# Patient Record
Sex: Female | Born: 1945 | Race: Asian | Hispanic: No | Smoking: Former smoker
Health system: Southern US, Community
[De-identification: ages and names within clinical notes are randomized; demographics above are authoritative.]

## PROBLEM LIST (undated history)

## (undated) DIAGNOSIS — M85859 Other specified disorders of bone density and structure, unspecified thigh: Secondary | ICD-10-CM

## (undated) DIAGNOSIS — A809 Acute poliomyelitis, unspecified: Secondary | ICD-10-CM

## (undated) DIAGNOSIS — M810 Age-related osteoporosis without current pathological fracture: Secondary | ICD-10-CM

## (undated) DIAGNOSIS — E119 Type 2 diabetes mellitus without complications: Secondary | ICD-10-CM

## (undated) DIAGNOSIS — E785 Hyperlipidemia, unspecified: Secondary | ICD-10-CM

## (undated) HISTORY — DX: Other specified disorders of bone density and structure, unspecified thigh: M85.859

## (undated) HISTORY — DX: Type 2 diabetes mellitus without complications: E11.9

## (undated) HISTORY — PX: VARICOSE VEIN SURGERY: SHX832

## (undated) HISTORY — DX: Hyperlipidemia, unspecified: E78.5

## (undated) HISTORY — DX: Acute poliomyelitis, unspecified: A80.9

## (undated) HISTORY — DX: Age-related osteoporosis without current pathological fracture: M81.0

---

## 2005-10-07 ENCOUNTER — Ambulatory Visit: Payer: Self-pay | Admitting: Gastroenterology

## 2010-10-09 ENCOUNTER — Ambulatory Visit: Payer: Self-pay | Admitting: Family Medicine

## 2011-10-28 ENCOUNTER — Ambulatory Visit: Payer: Self-pay

## 2012-11-10 ENCOUNTER — Ambulatory Visit: Payer: Self-pay

## 2013-11-22 ENCOUNTER — Ambulatory Visit: Payer: Self-pay | Admitting: Family Medicine

## 2013-12-05 ENCOUNTER — Ambulatory Visit: Payer: Self-pay | Admitting: Family Medicine

## 2014-10-15 DIAGNOSIS — J209 Acute bronchitis, unspecified: Secondary | ICD-10-CM | POA: Diagnosis not present

## 2014-10-15 DIAGNOSIS — R05 Cough: Secondary | ICD-10-CM | POA: Diagnosis not present

## 2014-10-22 DIAGNOSIS — M810 Age-related osteoporosis without current pathological fracture: Secondary | ICD-10-CM | POA: Diagnosis not present

## 2014-10-22 DIAGNOSIS — E119 Type 2 diabetes mellitus without complications: Secondary | ICD-10-CM | POA: Diagnosis not present

## 2014-10-22 DIAGNOSIS — Z7189 Other specified counseling: Secondary | ICD-10-CM | POA: Diagnosis not present

## 2014-10-22 DIAGNOSIS — Z0000 Encounter for general adult medical examination without abnormal findings: Secondary | ICD-10-CM | POA: Diagnosis not present

## 2014-11-01 DIAGNOSIS — E119 Type 2 diabetes mellitus without complications: Secondary | ICD-10-CM | POA: Diagnosis not present

## 2014-11-19 ENCOUNTER — Other Ambulatory Visit: Payer: Self-pay | Admitting: Family Medicine

## 2014-11-19 DIAGNOSIS — M810 Age-related osteoporosis without current pathological fracture: Secondary | ICD-10-CM

## 2014-11-25 ENCOUNTER — Ambulatory Visit
Admit: 2014-11-25 | Disposition: A | Discharge status: Home / Self Care | Payer: Self-pay | Attending: Family Medicine | Admitting: Family Medicine

## 2014-11-25 DIAGNOSIS — Z1231 Encounter for screening mammogram for malignant neoplasm of breast: Secondary | ICD-10-CM | POA: Diagnosis not present

## 2014-12-24 ENCOUNTER — Other Ambulatory Visit: Payer: Self-pay

## 2015-01-03 ENCOUNTER — Telehealth: Payer: Self-pay | Admitting: Family Medicine

## 2015-01-03 NOTE — Telephone Encounter (Signed)
Left message for patient to call back 01/03/15.

## 2015-01-03 NOTE — Telephone Encounter (Signed)
Spoke with patient and she is stopping metformin for the weekend and she will come in for appt on Monday.

## 2015-01-03 NOTE — Telephone Encounter (Signed)
Pt is requesting a return call. She is questioning if she should stop taking metformin because for 3 months it is causing her to itch and if she should make an appointment [phone]

## 2015-01-06 ENCOUNTER — Encounter: Payer: Self-pay | Admitting: Family Medicine

## 2015-01-06 ENCOUNTER — Ambulatory Visit (INDEPENDENT_AMBULATORY_CARE_PROVIDER_SITE_OTHER): Payer: Medicare Other | Admitting: Family Medicine

## 2015-01-06 VITALS — BP 100/58 | HR 63 | Resp 15 | Ht 61.0 in | Wt 133.6 lb

## 2015-01-06 DIAGNOSIS — E119 Type 2 diabetes mellitus without complications: Secondary | ICD-10-CM | POA: Insufficient documentation

## 2015-01-06 DIAGNOSIS — L03113 Cellulitis of right upper limb: Secondary | ICD-10-CM | POA: Insufficient documentation

## 2015-01-06 DIAGNOSIS — M810 Age-related osteoporosis without current pathological fracture: Secondary | ICD-10-CM | POA: Insufficient documentation

## 2015-01-06 MED ORDER — CLINDAMYCIN HCL 300 MG PO CAPS: 300.0000 mg | ORAL_CAPSULE | Freq: Four times a day (QID) | ORAL | Status: DC

## 2015-01-06 NOTE — Progress Notes (Deleted)
Name: Kiara Brady   MRN: [id number]    DOB: 08/23/1945   Date:01/06/2015       Progress Note  Subjective  Chief Complaint  Chief Complaint  Patient presents with  . Rash    Poision ivy     Rash This is a recurrent problem. The current episode started in the past 7 days. The problem has been gradually improving (started as a swelling on her right wrist) since onset. The affected locations include the right arm. The rash is characterized by swelling and blistering. She was exposed to plant contact. Associated symptoms include fatigue and shortness of breath. Pertinent negatives include no anorexia, cough, fever, nail changes, sore throat or vomiting. Past treatments include topical steroids. The treatment provided moderate relief.      Past Medical History  Diagnosis Date  . Diabetes mellitus without complication    Past Surgical History  Procedure Laterality Date  . Varicose vein surgery     Family History  Problem Relation Age of Onset  . Heart disease Mother   . Alzheimer's disease Mother   . Alcohol abuse Father   . Cancer Maternal Grandmother     breast     History  Substance Use Topics  . Smoking status: Former Games developermoker  . Smokeless tobacco: Never Used  . Alcohol Use: No     Current outpatient prescriptions:  .  metFORMIN (GLUCOPHAGE-XR) 500 MG 24 hr tablet, Take 1 tablet by mouth daily., Disp: , Rfl:   Allergies  Allergen Reactions  . Penicillins   . Prednisone Cough    Review of Systems  Constitutional: Positive for fatigue. Negative for fever.  HENT: Negative for sore throat.   Respiratory: Positive for shortness of breath. Negative for cough.   Gastrointestinal: Negative for vomiting and anorexia.  Skin: Positive for rash. Negative for nail changes.     Objective  Filed Vitals:   01/06/15 1513  BP: 100/58  Pulse: 63  Resp: 15  Height: 5\' 1"  (1.549 m)  Weight: 133 lb 9.6 oz (60.601 kg)  SpO2: 95%     Physical Exam  Constitutional: She  is well-developed, well-nourished, and in no distress.  Skin: Rash (macular, erythematous, mildly tender, blistering rash over the distal right arm.) noted.    ***  No results found for this or any previous visit (from the past 2160 hour(s)).   Assessment & Plan 1. Cellulitis of arm, right   - clindamycin (CLEOCIN) 300 MG capsule; Take 1 capsule (300 mg total) by mouth 4 (four) times daily.  Dispense: 28 capsule; Refill: 0     Lindell Tussey Asad A. Faylene KurtzShah [facility] 01/06/2015 3:49 PM

## 2015-01-06 NOTE — Progress Notes (Signed)
Kiara HackSyed Asad A Caedmon Louque, MD Physician Deleted  Progress Notes 01/06/2015 3:48 PM    Expand All Collapse All   Name: Kiara Brady MRN: [id number] DOB: 10/13/1945 Date:01/06/2015  Progress Note  Subjective  Chief Complaint  Chief Complaint  Patient presents with  . Rash    Poision ivy     Rash This is a recurrent problem. The current episode started in the past 7 days. The problem has been gradually improving (started as a swelling on her right wrist) since onset. The affected locations include the right arm. The rash is characterized by swelling and blistering. She was exposed to plant contact. Associated symptoms include fatigue and shortness of breath. Pertinent negatives include no anorexia, cough, fever, nail changes, sore throat or vomiting. Past treatments include topical steroids. The treatment provided moderate relief.      Past Medical History  Diagnosis Date  . Diabetes mellitus without complication    Past Surgical History  Procedure Laterality Date  . Varicose vein surgery     Family History  Problem Relation Age of Onset  . Heart disease Mother   . Alzheimer's disease Mother   . Alcohol abuse Father   . Cancer Maternal Grandmother     breast     History  Substance Use Topics  . Smoking status: Former Games developermoker  . Smokeless tobacco: Never Used  . Alcohol Use: No     Current outpatient prescriptions:  . metFORMIN (GLUCOPHAGE-XR) 500 MG 24 hr tablet, Take 1 tablet by mouth daily., Disp: , Rfl:   Allergies  Allergen Reactions  . Penicillins   . Prednisone Cough    Review of Systems  Constitutional: Positive for fatigue. Negative for fever.  HENT: Negative for sore throat.  Respiratory: Positive for shortness of breath. Negative for cough.  Gastrointestinal: Negative for vomiting and anorexia.  Skin: Positive for rash.  Negative for nail changes.     Objective  Filed Vitals:   01/06/15 1513  BP: 100/58  Pulse: 63  Resp: 15  Height: 5\' 1"  (1.549 m)  Weight: 133 lb 9.6 oz (60.601 kg)  SpO2: 95%     Physical Exam  Constitutional: She is well-developed, well-nourished, and in no distress.  Skin: Rash (macular, erythematous, mildly tender, blistering rash over the distal right arm.) noted.      No results found for this or any previous visit (from the past 2160 hour(s)).   Assessment & Plan 1. Cellulitis of arm, right Signs and symptoms consistent with cellulitis of right arm. Started on Clindamycin 300mg  every 6 hours for 7 days.  - clindamycin (CLEOCIN) 300 MG capsule; Take 1 capsule (300 mg total) by mouth 4 (four) times daily.  Dispense: 28 capsule; Refill: 0

## 2015-01-06 NOTE — Progress Notes (Deleted)
Name: Kiara Brady   MRN: [id number]    DOB: 04/05/1946   Date:01/06/2015       Progress Note  Subjective  Chief Complaint  No chief complaint on file.   HPI  *** No problem-specific assessment & plan notes found for this encounter.   No past medical history on file.  History  Substance Use Topics  . Smoking status: Not on file  . Smokeless tobacco: Not on file  . Alcohol Use: Not on file    No current outpatient prescriptions on file.  Allergies not on file  ROS  ***  Objective  There were no vitals filed for this visit.   Physical Exam  ***  No results found for this or any previous visit (from the past 2160 hour(s)).   Assessment & Plan  There are no diagnoses linked to this encounter.

## 2015-01-31 ENCOUNTER — Ambulatory Visit (INDEPENDENT_AMBULATORY_CARE_PROVIDER_SITE_OTHER): Payer: Medicare Other | Admitting: Family Medicine

## 2015-01-31 ENCOUNTER — Encounter: Payer: Self-pay | Admitting: Family Medicine

## 2015-01-31 VITALS — BP 112/58 | HR 57 | Temp 97.8°F | Ht 62.0 in | Wt 130.7 lb

## 2015-01-31 DIAGNOSIS — E119 Type 2 diabetes mellitus without complications: Secondary | ICD-10-CM | POA: Diagnosis not present

## 2015-01-31 MED ORDER — METFORMIN HCL ER 500 MG PO TB24: 500.0000 mg | ORAL_TABLET | Freq: Every day | ORAL | Status: DC

## 2015-01-31 NOTE — Progress Notes (Signed)
Name: Kiara Brady   MRN: [id number]    DOB: 04/22/1946   Date:01/31/2015       Progress Note  Subjective  Chief Complaint  Chief Complaint  Patient presents with  . Follow-up    cellulitis, stopped clindamycin after 3 days, chest pain.  Also says metformin makes her itch.    Diabetes She presents for her follow-up diabetic visit. She has type 2 diabetes mellitus. Her disease course has been stable. There are no hypoglycemic associated symptoms. Associated symptoms include polydipsia and polyuria. Pertinent negatives for diabetes include no foot paresthesias and no weakness. Symptoms are stable. Pertinent negatives for diabetic complications include no CVA, heart disease or peripheral neuropathy. Current diabetic treatment includes oral agent (monotherapy). Her weight is stable. She is following a generally healthy diet. An ACE inhibitor/angiotensin II receptor blocker is not being taken.   Patient was treated for cellulitis 3 weeks ago with clindamycin. She states that initially took clindamycin for 3 days without any problem but noticed some shortness of breath on day 4 after taking clindamycin. She discontinued clindamycin for a couple of days and took another dose after a few days which again caused her shortness of breath and she stopped taking clindamycin altogether and claims that her shortness of breath resolved. Cellulitis on her arm has resolved and she has no other symptoms at present. Of note, patient has self-reported history of side effects to multiple medications.   Past Medical History  Diagnosis Date  . Diabetes mellitus without complication     Past Surgical History  Procedure Laterality Date  . Varicose vein surgery      Family History  Problem Relation Age of Onset  . Heart disease Mother   . Alzheimer's disease Mother   . Alcohol abuse Father   . Cancer Maternal Grandmother     breast    History   Social History  . Marital Status: Divorced    Spouse Name:  N/A  . Number of Children: N/A  . Years of Education: N/A   Occupational History  . Not on file.   Social History Main Topics  . Smoking status: Former Games developermoker  . Smokeless tobacco: Never Used  . Alcohol Use: No  . Drug Use: No  . Sexual Activity: Not Currently   Other Topics Concern  . Not on file   Social History Narrative     Current outpatient prescriptions:  .  metFORMIN (GLUCOPHAGE-XR) 500 MG 24 hr tablet, Take 1 tablet (500 mg total) by mouth daily., Disp: 90 tablet, Rfl: 0  Allergies  Allergen Reactions  . Penicillins   . Prednisone Cough     Review of Systems  Neurological: Negative for weakness.  Endo/Heme/Allergies: Positive for polydipsia.      Objective  Filed Vitals:   01/31/15 0847  BP: 112/58  Pulse: 57  Temp: 97.8 F (36.6 C)  TempSrc: Oral  Height: 5\' 2"  (1.575 m)  Weight: 130 lb 11.2 oz (59.285 kg)  SpO2: 95%    Physical Exam  Constitutional: She is oriented to person, place, and time and well-developed, well-nourished, and in no distress.  HENT:  Head: Normocephalic and atraumatic.  Cardiovascular: Normal rate and regular rhythm.   Pulmonary/Chest: Effort normal and breath sounds normal.  Abdominal: Soft. Bowel sounds are normal.  Neurological: She is alert and oriented to person, place, and time.  Skin: Skin is warm and dry. No rash noted. No erythema. No pallor.  Nursing note and vitals reviewed.  Assessment & Plan 1. Type 2 diabetes mellitus without complication Continue metformin extended release daily. Repeat A1c today. We have discussed starting her on a statin and an ACE-I for primary prevention and she has verbalized agreement. I have asked her to contact us if she starts having any side effects or allergic reactions to metformin.  - metFORMIN (GLUCOPHAGE-XR) 500 MG 24 hr tablet; Take 1 tablet (500 mg total) by mouth daily.  Dispense: 90 tablet; Refill: 0 - Lipid panel - HgB A1c - Basic Metabolic Panel  (BMET)   Ellakate Gonsalves Asad A. Faylene [facility] 01/31/2015 5:21 PM

## 2015-02-01 LAB — LIPID PANEL
CHOL/HDL RATIO: 3.7 ratio (ref 0.0–4.4)
Cholesterol, Total: 217 mg/dL — ABNORMAL HIGH (ref 100–199)
HDL: 59 mg/dL (ref >39)
LDL Calculated: 135 mg/dL — ABNORMAL HIGH (ref 0–99)
TRIGLYCERIDES: 114 mg/dL (ref 0–149)
VLDL Cholesterol Cal: 23 mg/dL (ref 5–40)

## 2015-02-01 LAB — BASIC METABOLIC PANEL
BUN / CREAT RATIO: 10 — AB (ref 11–26)
BUN: 8 mg/dL (ref 8–27)
CO2: 26 mmol/L (ref 18–29)
Calcium: 9.2 mg/dL (ref 8.7–10.3)
Chloride: 105 mmol/L (ref 97–108)
Creatinine, Ser: 0.78 mg/dL (ref 0.57–1.00)
GFR calc non Af Amer: 78 mL/min/{1.73_m2} (ref >59)
GFR, EST AFRICAN AMERICAN: 90 mL/min/{1.73_m2} (ref >59)
Glucose: 114 mg/dL — ABNORMAL HIGH (ref 65–99)
POTASSIUM: 3.7 mmol/L (ref 3.5–5.2)
Sodium: 147 mmol/L — ABNORMAL HIGH (ref 134–144)

## 2015-02-01 LAB — HEMOGLOBIN A1C
Est. average glucose Bld gHb Est-mCnc: 140 mg/dL
HEMOGLOBIN A1C: 6.5 % — AB (ref 4.8–5.6)

## 2015-02-26 ENCOUNTER — Encounter: Payer: Self-pay | Admitting: Family Medicine

## 2015-02-26 ENCOUNTER — Ambulatory Visit (INDEPENDENT_AMBULATORY_CARE_PROVIDER_SITE_OTHER): Payer: Medicare Other | Admitting: Family Medicine

## 2015-02-26 VITALS — BP 121/69 | HR 58 | Temp 96.9°F | Resp 18 | Ht 62.0 in | Wt 131.3 lb

## 2015-02-26 DIAGNOSIS — E785 Hyperlipidemia, unspecified: Secondary | ICD-10-CM

## 2015-02-26 DIAGNOSIS — E1169 Type 2 diabetes mellitus with other specified complication: Secondary | ICD-10-CM | POA: Insufficient documentation

## 2015-02-26 MED ORDER — SIMVASTATIN 10 MG PO TABS: 10.0000 mg | ORAL_TABLET | Freq: Every day | ORAL | Status: DC

## 2015-02-26 NOTE — Progress Notes (Signed)
Name: Kiara Brady   MRN: [id number]    DOB: 1946-06-01   Date:02/26/2015       Progress Note  Subjective  Chief Complaint  Chief Complaint  Patient presents with  . Advice Only    Discuss Statin therapy  . Results    Recheck sodium levels    Hyperlipidemia This is a new diagnosis. The problem is uncontrolled. Recent lipid tests were reviewed and are high. Exacerbating diseases include diabetes. She has no history of liver disease. Associated symptoms include myalgias. Pertinent negatives include no chest pain or shortness of breath. She is currently on no antihyperlipidemic treatment. Risk factors for coronary artery disease include dyslipidemia and diabetes mellitus.      Past Medical History  Diagnosis Date  . Diabetes mellitus without complication     Past Surgical History  Procedure Laterality Date  . Varicose vein surgery      Family History  Problem Relation Age of Onset  . Heart disease Mother   . Alzheimer's disease Mother   . Alcohol abuse Father   . Cancer Maternal Grandmother     breast    History   Social History  . Marital Status: Divorced    Spouse Name: N/A  . Number of Children: N/A  . Years of Education: N/A   Occupational History  . Not on file.   Social History Main Topics  . Smoking status: Former Games developer  . Smokeless tobacco: Never Used  . Alcohol Use: No  . Drug Use: No  . Sexual Activity: Not Currently   Other Topics Concern  . Not on file   Social History Narrative     Current outpatient prescriptions:  .  metFORMIN (GLUCOPHAGE-XR) 500 MG 24 hr tablet, Take 1 tablet (500 mg total) by mouth daily., Disp: 90 tablet, Rfl: 0  Allergies  Allergen Reactions  . Penicillins   . Prednisone Cough     Review of Systems  Constitutional: Negative for fever and chills.  Respiratory: Negative for cough and shortness of breath.   Cardiovascular: Negative for chest pain and palpitations.  Musculoskeletal: Positive for myalgias.       Objective  Filed Vitals:   02/26/15 0834  BP: 121/69  Pulse: 58  Temp: 96.9 F (36.1 C)  TempSrc: Oral  Resp: 18  Height:  (1.575 m)  Weight: 131 lb 4.8 oz (59.557 kg)  SpO2: 95%    Physical Exam  Constitutional: She is well-developed, well-nourished, and in no distress.  HENT:  Head: Normocephalic and atraumatic.  Cardiovascular: Normal rate and regular rhythm.   Pulmonary/Chest: Effort normal and breath sounds normal.  Abdominal: Soft. Bowel sounds are normal.  Nursing note and vitals reviewed.      Recent Results (from the past 2160 hour(s))  Lipid panel     Status: Abnormal   Collection Time: 01/31/15  9:28 AM  Result Value Ref Range   Cholesterol, Total 217 (H) 100 - 199 mg/dL   Triglycerides 409 0 - 149 mg/dL   HDL 59 >81 mg/dL    Comment: According to ATP-III Guidelines, HDL-C >59 mg/dL is considered a negative risk factor for CHD.    VLDL Cholesterol Cal 23 5 - 40 mg/dL   LDL Calculated 191 (H) 0 - 99 mg/dL   Chol/HDL Ratio 3.7 0.0 - 4.4 ratio units    Comment:  T. Chol/HDL Ratio                                             Men  Women                               1/2 Avg.Risk  3.4    3.3                                   Avg.Risk  5.0    4.4                                2X Avg.Risk  9.6    7.1                                3X Avg.Risk 23.4   11.0   HgB A1c     Status: Abnormal   Collection Time: 01/31/15  9:28 AM  Result Value Ref Range   Hgb A1c MFr Bld 6.5 (H) 4.8 - 5.6 %    Comment:          Pre-diabetes: 5.7 - 6.4          Diabetes: >6.4          Glycemic control for adults with diabetes: <7.0    Est. average glucose Bld gHb Est-mCnc 140 mg/dL  Basic Metabolic Panel (BMET)     Status: Abnormal   Collection Time: 01/31/15  9:28 AM  Result Value Ref Range   Glucose 114 (H) 65 - 99 mg/dL   BUN 8 8 - 27 mg/dL   Creatinine, Ser 1.61 0.57 - 1.00 mg/dL   GFR calc non Af Amer 78 >59 mL/min/1.73    GFR calc Af Amer 90 >59 mL/min/1.73   BUN/Creatinine Ratio 10 (L) 11 - 26   Sodium 147 (H) 134 - 144 mmol/L   Potassium 3.7 3.5 - 5.2 mmol/L   Chloride 105 97 - 108 mmol/L   CO2 26 18 - 29 mmol/L   Calcium 9.2 8.7 - 10.3 mg/dL     Assessment & Plan 1. Dyslipidemia associated with type 2 diabetes mellitus After discussion of risks, benefits, and alternatives, we will start patient on Zocor 10 mg at bedtime. We will obtain liver enzymes prior to initiation of statin therapy. Recheck in 3-4 months. - simvastatin (ZOCOR) 10 MG tablet; Take 1 tablet (10 mg total) by mouth at bedtime.  Dispense: 90 tablet; Refill: 0 - Comprehensive metabolic panel   Kiara Brady Asad A. Faylene Kurtz [facility] 02/26/2015 8:55 AM

## 2015-02-27 LAB — COMPREHENSIVE METABOLIC PANEL
ALT: 14 IU/L (ref 0–32)
AST: 18 IU/L (ref 0–40)
Albumin/Globulin Ratio: 1.6 (ref 1.1–2.5)
Albumin: 4.5 g/dL (ref 3.6–4.8)
Alkaline Phosphatase: 80 IU/L (ref 39–117)
BUN / CREAT RATIO: 10 — AB (ref 11–26)
BUN: 9 mg/dL (ref 8–27)
Bilirubin Total: 0.4 mg/dL (ref 0.0–1.2)
CHLORIDE: 102 mmol/L (ref 97–108)
CO2: 27 mmol/L (ref 18–29)
Calcium: 9.5 mg/dL (ref 8.7–10.3)
Creatinine, Ser: 0.92 mg/dL (ref 0.57–1.00)
GFR calc Af Amer: 73 mL/min/{1.73_m2} (ref >59)
GFR calc non Af Amer: 64 mL/min/{1.73_m2} (ref >59)
GLOBULIN, TOTAL: 2.9 g/dL (ref 1.5–4.5)
Glucose: 105 mg/dL — ABNORMAL HIGH (ref 65–99)
Potassium: 4.3 mmol/L (ref 3.5–5.2)
Sodium: 143 mmol/L (ref 134–144)
Total Protein: 7.4 g/dL (ref 6.0–8.5)

## 2015-03-03 ENCOUNTER — Telehealth: Payer: Self-pay | Admitting: Family Medicine

## 2015-03-03 NOTE — Telephone Encounter (Signed)
Please call [facility] They are needing a DX Code for the Accu-Check.

## 2015-03-03 NOTE — Telephone Encounter (Signed)
Pharmacy needs DX code for Accucheck

## 2015-03-04 ENCOUNTER — Telehealth: Payer: Self-pay | Admitting: Family Medicine

## 2015-03-04 NOTE — Telephone Encounter (Signed)
Called [facility] [address] and provided them with the DX code E11.9 for Accu-Check

## 2015-03-04 NOTE — Progress Notes (Signed)
Patient notified

## 2015-03-04 NOTE — Telephone Encounter (Signed)
They may use Dx code E 11.9

## 2015-03-04 NOTE — Telephone Encounter (Signed)
Routed to Dr. Sherryll Burger to write new prescription for Accu-Check

## 2015-03-05 ENCOUNTER — Ambulatory Visit
Admission: RE | Admit: 2015-03-05 | Discharge: 2015-03-05 | Disposition: A | Discharge status: Home / Self Care | Payer: Medicare Other | Source: Ambulatory Visit | Attending: Family Medicine | Admitting: Family Medicine

## 2015-03-05 DIAGNOSIS — Z13820 Encounter for screening for osteoporosis: Secondary | ICD-10-CM | POA: Insufficient documentation

## 2015-03-05 DIAGNOSIS — M8580 Other specified disorders of bone density and structure, unspecified site: Secondary | ICD-10-CM | POA: Diagnosis not present

## 2015-03-05 DIAGNOSIS — M810 Age-related osteoporosis without current pathological fracture: Secondary | ICD-10-CM

## 2015-03-06 ENCOUNTER — Telehealth: Payer: Self-pay

## 2015-03-06 ENCOUNTER — Other Ambulatory Visit: Payer: Self-pay | Admitting: Family Medicine

## 2015-03-06 NOTE — Telephone Encounter (Signed)
Left vm for patient to return my call for results of Bone Density.

## 2015-03-06 NOTE — Telephone Encounter (Signed)
-----   Message from Ellyn Hack, MD sent at 03/06/2015 10:45 AM EDT ----- DEXA scan shows osteopenia at the femur with a T score of -2.3. Based on the scan, 10 year probability of major osteoporotic fracture 7.8% and for hip fracture is 1.8%. She does not need to be on a medication at this time but should ensure adequate dietary intake of calcium and vitamin D. Please have patient schedule an appointment to check her vitamin D levels.

## 2015-05-05 ENCOUNTER — Ambulatory Visit: Payer: Medicare Other | Admitting: Family Medicine

## 2015-05-06 ENCOUNTER — Ambulatory Visit: Payer: Medicare Other

## 2015-06-02 ENCOUNTER — Ambulatory Visit (INDEPENDENT_AMBULATORY_CARE_PROVIDER_SITE_OTHER): Payer: Medicare Other | Admitting: Family Medicine

## 2015-06-02 ENCOUNTER — Encounter: Payer: Self-pay | Admitting: Family Medicine

## 2015-06-02 VITALS — BP 120/68 | HR 76 | Temp 97.2°F | Resp 16 | Ht 62.0 in | Wt 132.4 lb

## 2015-06-02 DIAGNOSIS — E1169 Type 2 diabetes mellitus with other specified complication: Secondary | ICD-10-CM | POA: Diagnosis not present

## 2015-06-02 DIAGNOSIS — E119 Type 2 diabetes mellitus without complications: Secondary | ICD-10-CM | POA: Diagnosis not present

## 2015-06-02 DIAGNOSIS — E785 Hyperlipidemia, unspecified: Secondary | ICD-10-CM | POA: Diagnosis not present

## 2015-06-02 LAB — POCT GLYCOSYLATED HEMOGLOBIN (HGB A1C): Hemoglobin A1C: 6.3

## 2015-06-02 MED ORDER — SIMVASTATIN 10 MG PO TABS: 10.0000 mg | ORAL_TABLET | Freq: Every day | ORAL | Status: DC

## 2015-06-02 MED ORDER — METFORMIN HCL ER 500 MG PO TB24: 500.0000 mg | ORAL_TABLET | Freq: Every day | ORAL | Status: DC

## 2015-06-02 NOTE — Progress Notes (Signed)
Name: Kiara Brady   MRN: [id number]    DOB: 09/08/1945   Date:06/02/2015       Progress Note  Subjective  Chief Complaint  Chief Complaint  Patient presents with  . Hyperlipidemia    3 month recheck  . Diabetes    Hyperlipidemia This is a chronic problem. The problem is controlled. Recent lipid tests were reviewed and are high. Pertinent negatives include no chest pain, leg pain, myalgias or shortness of breath. Current antihyperlipidemic treatment includes statins.  Diabetes She presents for her follow-up diabetic visit. She has type 2 diabetes mellitus. Her disease course has been stable. There are no hypoglycemic associated symptoms. Pertinent negatives for diabetes include no chest pain. Current diabetic treatment includes oral agent (monotherapy). Home blood sugar record trend: Pt. not checking her BG      Past Medical History  Diagnosis Date  . Diabetes mellitus without complication [facility])     Past Surgical History  Procedure Laterality Date  . Varicose vein surgery      Family History  Problem Relation Age of Onset  . Heart disease Mother   . Alzheimer's disease Mother   . Alcohol abuse Father   . Cancer Maternal Grandmother     breast    Social History   Social History  . Marital Status: Divorced    Spouse Name: N/A  . Number of Children: N/A  . Years of Education: N/A   Occupational History  . Not on file.   Social History Main Topics  . Smoking status: Former Games developer  . Smokeless tobacco: Never Used  . Alcohol Use: No  . Drug Use: No  . Sexual Activity: Not Currently   Other Topics Concern  . Not on file   Social History Narrative    Current outpatient prescriptions:  .  metFORMIN (GLUCOPHAGE-XR) 500 MG 24 hr tablet, Take 1 tablet (500 mg total) by mouth daily., Disp: 90 tablet, Rfl: 0 .  simvastatin (ZOCOR) 10 MG tablet, Take 1 tablet (10 mg total) by mouth at bedtime., Disp: 90 tablet, Rfl: 0  Allergies  Allergen Reactions  .  Penicillins   . Prednisone Cough    Review of Systems  Constitutional: Negative for fever and chills.  Respiratory: Negative for shortness of breath.   Cardiovascular: Negative for chest pain, palpitations and orthopnea.  Musculoskeletal: Negative for myalgias.    Objective  Filed Vitals:   06/02/15 1549  BP: 120/68  Pulse: 76  Temp: 97.2 F (36.2 C)  TempSrc: Oral  Resp: 16  Height:  (1.575 m)  Weight: 132 lb 6.4 oz (60.056 kg)  SpO2: 93%    Physical Exam  Constitutional: She is oriented to person, place, and time and well-developed, well-nourished, and in no distress.  HENT:  Head: Normocephalic and atraumatic.  Cardiovascular: Normal rate, regular rhythm and normal heart sounds.   Pulmonary/Chest: Effort normal and breath sounds normal.  Neurological: She is alert and oriented to person, place, and time.  Nursing note and vitals reviewed.   Assessment & Plan  1. Type 2 diabetes mellitus without complication, without long-term current use of insulin (HCC)  - POCT HgB A1C - metFORMIN (GLUCOPHAGE-XR) 500 MG 24 hr tablet; Take 1 tablet (500 mg total) by mouth daily.  Dispense: 90 tablet; Refill: 0  2. Dyslipidemia associated with type 2 diabetes mellitus (HCC)  - Lipid Profile - simvastatin (ZOCOR) 10 MG tablet; Take 1 tablet (10 mg total) by mouth at bedtime.  Dispense: 90 tablet; Refill:  0   Lessie Manigo Asad A. Faylene KurtzShah [facility] 06/02/2015 3:58 PM

## 2015-06-03 ENCOUNTER — Telehealth: Payer: Self-pay | Admitting: Emergency Medicine

## 2015-06-03 NOTE — Telephone Encounter (Signed)
Patient notified of A1c 

## 2015-06-10 ENCOUNTER — Ambulatory Visit (INDEPENDENT_AMBULATORY_CARE_PROVIDER_SITE_OTHER): Payer: Medicare Other | Admitting: Family Medicine

## 2015-06-10 ENCOUNTER — Ambulatory Visit
Admission: RE | Admit: 2015-06-10 | Discharge: 2015-06-10 | Disposition: A | Discharge status: Home / Self Care | Payer: Medicare Other | Source: Ambulatory Visit | Attending: Family Medicine | Admitting: Family Medicine

## 2015-06-10 ENCOUNTER — Encounter: Payer: Self-pay | Admitting: Family Medicine

## 2015-06-10 ENCOUNTER — Ambulatory Visit
Admission: RE | Admit: 2015-06-10 | Discharge: 2015-06-10 | Disposition: A | Discharge status: Home / Self Care | Payer: Medicare Other | Source: Ambulatory Visit | Admitting: Family Medicine

## 2015-06-10 VITALS — BP 120/74 | HR 69 | Temp 98.2°F | Resp 17 | Ht 62.0 in

## 2015-06-10 DIAGNOSIS — M79605 Pain in left leg: Secondary | ICD-10-CM | POA: Diagnosis not present

## 2015-06-10 DIAGNOSIS — M79662 Pain in left lower leg: Secondary | ICD-10-CM

## 2015-06-10 DIAGNOSIS — M25562 Pain in left knee: Secondary | ICD-10-CM

## 2015-06-10 MED ORDER — NAPROXEN 500 MG PO TABS
500.0000 mg | ORAL_TABLET | Freq: Two times a day (BID) | ORAL | Status: DC
Start: 2015-06-10 — End: 2016-01-15

## 2015-06-10 NOTE — Progress Notes (Signed)
Name: Kiara Brady   MRN: [id number]    DOB: 08/05/1945   Date:06/10/2015       Progress Note  Subjective  Chief Complaint  Chief Complaint  Patient presents with  . Acute Visit    Pain in left leg  . Diabetes  . Hyperlipidemia    Leg Pain  The incident occurred at home. There was no injury mechanism. The pain is present in the left leg. The quality of the pain is described as burning. The pain is at a severity of 10/10 ('If 10 is the worse, its 11'). The pain is severe. Pertinent negatives include no inability to bear weight, numbness or tingling. The symptoms are aggravated by movement and palpation. She has tried rest, NSAIDs and elevation for the symptoms. The treatment provided moderate relief.    Past Medical History  Diagnosis Date  . Diabetes mellitus without complication [facility](HCC)     Past Surgical History  Procedure Laterality Date  . Varicose vein surgery      Family History  Problem Relation Age of Onset  . Heart disease Mother   . Alzheimer's disease Mother   . Alcohol abuse Father   . Cancer Maternal Grandmother     breast    Social History   Social History  . Marital Status: Divorced    Spouse Name: N/A  . Number of Children: N/A  . Years of Education: N/A   Occupational History  . Not on file.   Social History Main Topics  . Smoking status: Former Games developermoker  . Smokeless tobacco: Never Used  . Alcohol Use: No  . Drug Use: No  . Sexual Activity: Not Currently   Other Topics Concern  . Not on file   Social History Narrative     Current outpatient prescriptions:  .  metFORMIN (GLUCOPHAGE-XR) 500 MG 24 hr tablet, Take 1 tablet (500 mg total) by mouth daily., Disp: 90 tablet, Rfl: 0 .  simvastatin (ZOCOR) 10 MG tablet, Take 1 tablet (10 mg total) by mouth at bedtime., Disp: 90 tablet, Rfl: 0  Allergies  Allergen Reactions  . Penicillins   . Prednisone Cough     Review of Systems  Constitutional: Negative for fever and chills.    Musculoskeletal: Positive for myalgias. Negative for back pain.  Neurological: Negative for tingling, focal weakness and numbness.    Objective  Filed Vitals:   06/10/15 0934  BP: 120/74  Pulse: 69  Temp: 98.2 F (36.8 C)  TempSrc: Oral  Resp: 17  Height: 5\' 2"  (1.575 m)  SpO2: 96%    Physical Exam  Musculoskeletal:       Left upper leg: She exhibits tenderness. She exhibits no bony tenderness, no swelling, no edema and no deformity.       Left lower leg: She exhibits tenderness. She exhibits no bony tenderness and no swelling.       Legs: Moderate to severe pain upon palpation from distal upper leg, back of the knee, and proximal lower leg. No palpable cord, no swelling present. Range of motion of left lower extremity restricted secondary to pain but is able to bear weight.  Nursing note and vitals reviewed.  Assessment & Plan  1. Pain of left knee and lower leg Differential includes acute osseous abnormality versus DVT versus Baker's cyst (unlikely), versus acute muscle sprain. We will obtain imaging studies to rule out the above-mentioned possibilities. Start on Naprosyn 500 mg twice a day with meals.  - US Venous Img Lower  Unilateral Left; Future - DG Tibia/Fibula Left; Future - DG FEMUR MIN 2 VIEWS LEFT; Future - naproxen (NAPROSYN) 500 MG tablet; Take 1 tablet (500 mg total) by mouth 2 (two) times daily with a meal.  Dispense: 14 tablet; Refill: 0   Danna Casella Asad A. [facility]ay[facility] 06/10/2015 10:19 AM

## 2015-06-12 ENCOUNTER — Telehealth: Payer: Self-pay

## 2015-06-12 NOTE — Telephone Encounter (Signed)
Returned call and left a voice message for patient 

## 2015-06-12 NOTE — Telephone Encounter (Signed)
Received call from Triage Nurse Belinda on 06/12/2015 @ 1:34pm, Nurse stated that she received a call from Patient Kiara Brady and she was stating that she was having bad chest pain she described it as feeling like someone punched her in the chest also pain in her left arm, as nurse began to triage patient symptoms indicated heart attack symptoms nurse asked instructed patient to call 911 patient laughed and said she is not having a heart attack she is allergic to medication that was prescribed to her in office visit on 06/11/2015 for leg pain which was Naproxen 500mg , however patient stated that she had been taking Aleve for pain which is the same as Naproxen, she medication reaction was almost ruled out and nurse once again informed patient to call 911 it sounds like a possible heart attack put quoted "Jesus Christ I just want to talk to my doctor" as she hung up the phone. I called patient at 1:49pm to see how she was doing and if she had went to the emergency room there was no answer I then left a message informing patient I was following up on her to see how she was feeling and to see if chest pain had increased and if she would please go to Urgent care or the emergency asked patient to return call once she received message.

## 2015-06-25 ENCOUNTER — Telehealth: Payer: Self-pay | Admitting: Family Medicine

## 2015-06-25 NOTE — Telephone Encounter (Signed)
PT IS REQUESTING A RX FOR A WALKER. SAYS THAT [facility] THEY JUST NEED A RX FOR THIS. SHE SAYS THAT SHE IS HAVING A HARD TIME AND IS IN A LOT OF PAIN AND NEES THIS WALKER. CAN BE CALLED INTO [facility] ON [address]

## 2015-06-25 NOTE — Telephone Encounter (Signed)
Returned call and left a voice message. 

## 2015-06-25 NOTE — Telephone Encounter (Signed)
Routed to Dr. Shah for approval 

## 2015-06-30 ENCOUNTER — Ambulatory Visit (INDEPENDENT_AMBULATORY_CARE_PROVIDER_SITE_OTHER): Payer: Medicare Other | Admitting: Family Medicine

## 2015-06-30 ENCOUNTER — Encounter: Payer: Self-pay | Admitting: Family Medicine

## 2015-06-30 VITALS — BP 128/64 | HR 75 | Temp 98.2°F | Resp 16 | Wt 131.7 lb

## 2015-06-30 DIAGNOSIS — M25562 Pain in left knee: Secondary | ICD-10-CM

## 2015-06-30 DIAGNOSIS — G8929 Other chronic pain: Secondary | ICD-10-CM | POA: Insufficient documentation

## 2015-06-30 NOTE — Progress Notes (Signed)
Name: Kiara Brady   MRN: [id number]    DOB: 01/24/1946   Date:06/30/2015       Progress Note  Subjective  Chief Complaint  Chief Complaint  Patient presents with  . Leg Pain    patient presents with bilateral leg pain while walking  . Joint Pain    multiple joint pain    Knee Pain  The pain is present in the left knee. The pain is at a severity of 9/10. The pain is moderate. Pertinent negatives include no inability to bear weight, loss of motion, muscle weakness, numbness or tingling. She reports no foreign bodies present. The symptoms are aggravated by movement and palpation. She has tried NSAIDs for the symptoms. The treatment provided moderate relief.  patient was unable to take Naproxen because of side effect of reflux.   Past Medical History  Diagnosis Date  . Diabetes mellitus without complication [facility](HCC)     Past Surgical History  Procedure Laterality Date  . Varicose vein surgery      Family History  Problem Relation Age of Onset  . Heart disease Mother   . Alzheimer's disease Mother   . Alcohol abuse Father   . Cancer Maternal Grandmother     breast    Social History   Social History  . Marital Status: Divorced    Spouse Name: N/A  . Number of Children: N/A  . Years of Education: N/A   Occupational History  . Not on file.   Social History Main Topics  . Smoking status: Former Games developermoker  . Smokeless tobacco: Never Used  . Alcohol Use: No  . Drug Use: No  . Sexual Activity: Not Currently   Other Topics Concern  . Not on file   Social History Narrative     Current outpatient prescriptions:  .  metFORMIN (GLUCOPHAGE-XR) 500 MG 24 hr tablet, Take 1 tablet (500 mg total) by mouth daily., Disp: 90 tablet, Rfl: 0 .  naproxen (NAPROSYN) 500 MG tablet, Take 1 tablet (500 mg total) by mouth 2 (two) times daily with a meal., Disp: 14 tablet, Rfl: 0 .  simvastatin (ZOCOR) 10 MG tablet, Take 1 tablet (10 mg total) by mouth at bedtime., Disp: 90 tablet, Rfl:  0  Allergies  Allergen Reactions  . Penicillins   . Prednisone Cough     Review of Systems  Constitutional: Negative for fever and chills.  Musculoskeletal: Positive for joint pain.  Neurological: Negative for tingling and numbness.    Objective  Filed Vitals:   06/30/15 1147  BP: 128/64  Pulse: 75  Temp: 98.2 F (36.8 C)  TempSrc: Oral  Resp: 16  Weight: 131 lb 11.2 oz (59.739 kg)  SpO2: 96%    Physical Exam  Musculoskeletal:       Left knee: She exhibits no swelling, no effusion, no deformity and no erythema. Tenderness found.       Legs: Nursing note and vitals reviewed.     Assessment & Plan  1. Left knee pain Discussed patient's Venous Doppler ultrasound and x-ray of left leg with radiology. 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 We will refer to orthopedics for further eval and management. Rx for walker provided.  - Ambulatory referral to Orthopedic Surgery   Kiara Brady Asad A. Faylene KurtzShah [facility] 06/30/2015 12:01 PM

## 2015-07-30 ENCOUNTER — Other Ambulatory Visit: Payer: Self-pay | Admitting: Specialist

## 2015-07-30 DIAGNOSIS — M1712 Unilateral primary osteoarthritis, left knee: Secondary | ICD-10-CM

## 2015-08-05 ENCOUNTER — Encounter: Payer: Self-pay | Admitting: Family Medicine

## 2015-08-05 ENCOUNTER — Ambulatory Visit: Payer: Medicare Other | Admitting: Family Medicine

## 2015-08-05 NOTE — Progress Notes (Signed)
This encounter was created in error - please disregard.

## 2015-08-06 ENCOUNTER — Ambulatory Visit
Admission: RE | Admit: 2015-08-06 | Discharge: 2015-08-06 | Disposition: A | Discharge status: Home / Self Care | Payer: Medicare Other | Source: Ambulatory Visit | Attending: Specialist | Admitting: Specialist

## 2015-08-06 DIAGNOSIS — M25562 Pain in left knee: Secondary | ICD-10-CM | POA: Diagnosis not present

## 2015-08-06 DIAGNOSIS — M1712 Unilateral primary osteoarthritis, left knee: Secondary | ICD-10-CM | POA: Diagnosis not present

## 2015-08-06 DIAGNOSIS — X58XXXA Exposure to other specified factors, initial encounter: Secondary | ICD-10-CM | POA: Diagnosis not present

## 2015-08-06 DIAGNOSIS — S83232A Complex tear of medial meniscus, current injury, left knee, initial encounter: Secondary | ICD-10-CM | POA: Diagnosis not present

## 2015-08-06 DIAGNOSIS — M7122 Synovial cyst of popliteal space [Baker], left knee: Secondary | ICD-10-CM | POA: Diagnosis not present

## 2015-08-22 ENCOUNTER — Ambulatory Visit: Payer: Medicare Other

## 2015-09-03 ENCOUNTER — Encounter: Payer: Self-pay | Admitting: Family Medicine

## 2015-09-03 ENCOUNTER — Ambulatory Visit (INDEPENDENT_AMBULATORY_CARE_PROVIDER_SITE_OTHER): Payer: Medicare Other | Admitting: Family Medicine

## 2015-09-03 VITALS — BP 124/70 | HR 77 | Temp 98.1°F | Resp 19 | Ht 62.0 in | Wt 132.9 lb

## 2015-09-03 DIAGNOSIS — E119 Type 2 diabetes mellitus without complications: Secondary | ICD-10-CM | POA: Diagnosis not present

## 2015-09-03 DIAGNOSIS — E785 Hyperlipidemia, unspecified: Secondary | ICD-10-CM | POA: Diagnosis not present

## 2015-09-03 DIAGNOSIS — R011 Cardiac murmur, unspecified: Secondary | ICD-10-CM | POA: Diagnosis not present

## 2015-09-03 DIAGNOSIS — E1169 Type 2 diabetes mellitus with other specified complication: Secondary | ICD-10-CM | POA: Diagnosis not present

## 2015-09-03 LAB — POCT GLYCOSYLATED HEMOGLOBIN (HGB A1C): HEMOGLOBIN A1C: 6.2

## 2015-09-03 MED ORDER — SIMVASTATIN 10 MG PO TABS: 10.0000 mg | ORAL_TABLET | Freq: Every day | ORAL | Status: DC

## 2015-09-03 MED ORDER — METFORMIN HCL ER 500 MG PO TB24: 500.0000 mg | ORAL_TABLET | Freq: Every day | ORAL | Status: DC

## 2015-09-03 NOTE — Progress Notes (Signed)
Name: Kiara Brady   MRN: [id number]    DOB: 11-15-1945   Date:09/03/2015       Progress Note  Subjective  Chief Complaint  Chief Complaint  Patient presents with  . Follow-up    3 mo  . Diabetes  . Hyperlipidemia  . Anxiety  . Medication Refill    simivastatin 10 mg / metformin 500 mg     Diabetes She presents for her follow-up diabetic visit. She has type 2 diabetes mellitus. Her disease course has been stable. There are no hypoglycemic associated symptoms. Pertinent negatives for diabetes include no chest pain, no fatigue, no foot paresthesias, no polydipsia and no polyuria. Current diabetic treatment includes oral agent (monotherapy). Her weight is stable. Her breakfast blood glucose range is generally 110-130 mg/dl. An ACE inhibitor/angiotensin II receptor blocker is not being taken.  Hyperlipidemia This is a chronic problem. The problem is uncontrolled. Recent lipid tests were reviewed and are high. Exacerbating diseases include diabetes. Pertinent negatives include no chest pain, leg pain, myalgias or shortness of breath. Current antihyperlipidemic treatment includes statins.     Past Medical History  Diagnosis Date  . Diabetes mellitus without complication [facility])     Past Surgical History  Procedure Laterality Date  . Varicose vein surgery      Family History  Problem Relation Age of Onset  . Heart disease Mother   . Alzheimer's disease Mother   . Alcohol abuse Father   . Cancer Maternal Grandmother     breast    Social History   Social History  . Marital Status: Divorced    Spouse Name: N/A  . Number of Children: N/A  . Years of Education: N/A   Occupational History  . Not on file.   Social History Main Topics  . Smoking status: Former Games developer  . Smokeless tobacco: Never Used  . Alcohol Use: No  . Drug Use: No  . Sexual Activity: Not Currently   Other Topics Concern  . Not on file   Social History Narrative     Current outpatient  prescriptions:  .  Calcium Citrate-Vitamin D (CALCIUM CITRATE + D) 315-250 MG-UNIT TABS, Take by mouth., Disp: , Rfl:  .  metFORMIN (GLUCOPHAGE-XR) 500 MG 24 hr tablet, Take 1 tablet (500 mg total) by mouth daily., Disp: 90 tablet, Rfl: 0 .  simvastatin (ZOCOR) 10 MG tablet, Take 1 tablet (10 mg total) by mouth at bedtime., Disp: 90 tablet, Rfl: 0 .  naproxen (NAPROSYN) 500 MG tablet, Take 1 tablet (500 mg total) by mouth 2 (two) times daily with a meal. (Patient not taking: Reported on 08/05/2015), Disp: 14 tablet, Rfl: 0  Allergies  Allergen Reactions  . Penicillin G Hives and Shortness Of Breath  . Penicillins   . Prednisone Cough     Review of Systems  Constitutional: Negative for fatigue.  Respiratory: Negative for shortness of breath.   Cardiovascular: Negative for chest pain.  Musculoskeletal: Negative for myalgias.  Endo/Heme/Allergies: Negative for polydipsia.     Objective  Filed Vitals:   09/03/15 1543  BP: 124/70  Pulse: 77  Temp: 98.1 F (36.7 C)  TempSrc: Oral  Resp: 19  Height:  (1.575 m)  Weight: 132 lb 14.4 oz (60.283 kg)  SpO2: 97%    Physical Exam  Constitutional: She is oriented to person, place, and time and well-developed, well-nourished, and in no distress.  HENT:  Head: Normocephalic and atraumatic.  Cardiovascular: Normal rate and regular rhythm.   Murmur  heard.  Systolic murmur is present with a grade of 2/6  2/6 systolic murmur  Pulmonary/Chest: Effort normal and breath sounds normal.  Neurological: She is alert and oriented to person, place, and time.  Nursing note and vitals reviewed.    Assessment & Plan  1. Dyslipidemia associated with type 2 diabetes mellitus (HCC) Obtain lipid panel to evaluate efficacy of simvastatin. - Lipid Profile - Comprehensive Metabolic Panel (CMET) - simvastatin (ZOCOR) 10 MG tablet; Take 1 tablet (10 mg total) by mouth at bedtime.  Dispense: 90 tablet; Refill: 0  2. Type 2 diabetes mellitus  without complication, without long-term current use of insulin (HCC) A1c at goal, continue metformin and recheck in 3-4 months - POCT HgB A1C - Urine Microalbumin w/creat. ratio - metFORMIN (GLUCOPHAGE-XR) 500 MG 24 hr tablet; Take 1 tablet (500 mg total) by mouth daily.  Dispense: 90 tablet; Refill: 0  3. Heart murmur No cardiopulmonary symptoms. If murmur is persistent, will refer to cardiology.   Kiara Brady [facility] 09/03/2015 4:13 PM

## 2015-09-04 ENCOUNTER — Encounter: Payer: Self-pay | Admitting: Family Medicine

## 2015-09-08 DIAGNOSIS — E119 Type 2 diabetes mellitus without complications: Secondary | ICD-10-CM | POA: Diagnosis not present

## 2015-09-08 DIAGNOSIS — E785 Hyperlipidemia, unspecified: Secondary | ICD-10-CM | POA: Diagnosis not present

## 2015-09-08 DIAGNOSIS — E1169 Type 2 diabetes mellitus with other specified complication: Secondary | ICD-10-CM | POA: Diagnosis not present

## 2015-09-09 LAB — COMPREHENSIVE METABOLIC PANEL
ALK PHOS: 72 IU/L (ref 39–117)
ALT: 10 IU/L (ref 0–32)
AST: 17 IU/L (ref 0–40)
Albumin/Globulin Ratio: 1.5 (ref 1.1–2.5)
Albumin: 4.4 g/dL (ref 3.5–4.8)
BILIRUBIN TOTAL: 0.6 mg/dL (ref 0.0–1.2)
BUN / CREAT RATIO: 12 (ref 11–26)
BUN: 10 mg/dL (ref 8–27)
CHLORIDE: 102 mmol/L (ref 96–106)
CO2: 24 mmol/L (ref 18–29)
CREATININE: 0.81 mg/dL (ref 0.57–1.00)
Calcium: 9.6 mg/dL (ref 8.7–10.3)
GFR calc Af Amer: 85 mL/min/{1.73_m2} (ref >59)
GFR calc non Af Amer: 74 mL/min/{1.73_m2} (ref >59)
GLOBULIN, TOTAL: 2.9 g/dL (ref 1.5–4.5)
Glucose: 105 mg/dL — ABNORMAL HIGH (ref 65–99)
Potassium: 4 mmol/L (ref 3.5–5.2)
SODIUM: 144 mmol/L (ref 134–144)
Total Protein: 7.3 g/dL (ref 6.0–8.5)

## 2015-09-09 LAB — LIPID PANEL
CHOLESTEROL TOTAL: 166 mg/dL (ref 100–199)
Chol/HDL Ratio: 2.4 ratio units (ref 0.0–4.4)
HDL: 68 mg/dL (ref >39)
LDL Calculated: 73 mg/dL (ref 0–99)
TRIGLYCERIDES: 127 mg/dL (ref 0–149)
VLDL CHOLESTEROL CAL: 25 mg/dL (ref 5–40)

## 2015-09-09 LAB — MICROALBUMIN / CREATININE URINE RATIO
Creatinine, Urine: 37.8 mg/dL
MICROALB/CREAT RATIO: <7.9 mg/g creat (ref 0.0–30.0)

## 2015-09-11 DIAGNOSIS — M1712 Unilateral primary osteoarthritis, left knee: Secondary | ICD-10-CM | POA: Diagnosis not present

## 2015-09-18 DIAGNOSIS — M1712 Unilateral primary osteoarthritis, left knee: Secondary | ICD-10-CM | POA: Diagnosis not present

## 2015-09-25 DIAGNOSIS — M1712 Unilateral primary osteoarthritis, left knee: Secondary | ICD-10-CM | POA: Diagnosis not present

## 2015-10-07 ENCOUNTER — Encounter: Payer: Medicare Other | Admitting: Family Medicine

## 2015-11-05 ENCOUNTER — Ambulatory Visit (INDEPENDENT_AMBULATORY_CARE_PROVIDER_SITE_OTHER): Payer: Medicare Other | Admitting: Family Medicine

## 2015-11-05 ENCOUNTER — Encounter: Payer: Self-pay | Admitting: Family Medicine

## 2015-11-05 VITALS — BP 136/74 | HR 63 | Temp 98.0°F | Resp 14 | Ht 62.0 in | Wt 130.3 lb

## 2015-11-05 DIAGNOSIS — R05 Cough: Secondary | ICD-10-CM

## 2015-11-05 DIAGNOSIS — R058 Other specified cough: Secondary | ICD-10-CM

## 2015-11-05 MED ORDER — BENZONATATE 200 MG PO CAPS: 200.0000 mg | ORAL_CAPSULE | Freq: Two times a day (BID) | ORAL | Status: DC | PRN

## 2015-11-05 MED ORDER — AZITHROMYCIN 250 MG PO TABS: ORAL_TABLET | ORAL | Status: DC

## 2015-11-05 NOTE — Progress Notes (Signed)
Name: Kiara Brady   MRN: [id number]    DOB: January 15, 1946   Date:11/05/2015       Progress Note  Subjective  Chief Complaint  Chief Complaint  Patient presents with  . URI    Onset-2 weeks, sore throat and coughing up yellow mucus last week now it has turned to a white color phelgm. Patient has been taking Halls cough drop and Ibuprofen with some relief,  symptoms have improved some.    URI  This is a new problem. The current episode started in the past 7 days. Associated symptoms include chest pain, congestion and coughing. Pertinent negatives include no sinus pain or sore throat (itchy throat, soreness has resolved.). She has tried NSAIDs for the symptoms.     Past Medical History  Diagnosis Date  . Hyperlipidemia   . Diabetes mellitus without complication [facility])     Past Surgical History  Procedure Laterality Date  . Varicose vein surgery      Family History  Problem Relation Age of Onset  . Heart disease Mother   . Alzheimer's disease Mother   . Alcohol abuse Father   . Cancer Maternal Grandmother     breast    Social History   Social History  . Marital Status: Divorced    Spouse Name: N/A  . Number of Children: N/A  . Years of Education: N/A   Occupational History  . Not on file.   Social History Main Topics  . Smoking status: Former Games developer  . Smokeless tobacco: Never Used  . Alcohol Use: No  . Drug Use: No  . Sexual Activity: Not Currently   Other Topics Concern  . Not on file   Social History Narrative     Current outpatient prescriptions:  .  Calcium Citrate-Vitamin D (CALCIUM CITRATE + D) 315-250 MG-UNIT TABS, Take by mouth., Disp: , Rfl:  .  metFORMIN (GLUCOPHAGE-XR) 500 MG 24 hr tablet, Take 1 tablet (500 mg total) by mouth daily., Disp: 90 tablet, Rfl: 0 .  naproxen (NAPROSYN) 500 MG tablet, Take 1 tablet (500 mg total) by mouth 2 (two) times daily with a meal., Disp: 14 tablet, Rfl: 0 .  simvastatin (ZOCOR) 10 MG tablet, Take 1 tablet (10 mg  total) by mouth at bedtime., Disp: 90 tablet, Rfl: 0  Allergies  Allergen Reactions  . Penicillin G Hives and Shortness Of Breath  . Penicillins   . Prednisone Cough     Review of Systems  Constitutional: Negative for fever and chills.  HENT: Positive for congestion. Negative for sore throat (itchy throat, soreness has resolved.).   Respiratory: Positive for cough.   Cardiovascular: Positive for chest pain.     Objective  Filed Vitals:   11/05/15 1508  BP: 136/74  Pulse: 63  Temp: 98 F (36.7 C)  TempSrc: Oral  Resp: 14  Height:  (1.575 m)  Weight: 130 lb 4.8 oz (59.104 kg)  SpO2: 98%    Physical Exam  Constitutional: She is well-developed, well-nourished, and in no distress.  HENT:  Nose: Right sinus exhibits no maxillary sinus tenderness and no frontal sinus tenderness. Left sinus exhibits no maxillary sinus tenderness and no frontal sinus tenderness.  Mouth/Throat: Posterior oropharyngeal erythema present.  Cardiovascular: Normal rate and regular rhythm.   Pulmonary/Chest: Effort normal and breath sounds normal.  Nursing note and vitals reviewed.     Assessment & Plan  1. Productive cough Improving, but still not completely resolved. Requesting antibiotic therapy which is reasonable. We'll  start on Z-Pak and antitussive. - azithromycin (ZITHROMAX Z-PAK) 250 MG tablet; 2 tabs po x day 1, then 1 tab po q dyay x 4 days  Dispense: 6 each; Refill: 0 - benzonatate (TESSALON) 200 MG capsule; Take 1 capsule (200 mg total) by mouth 2 (two) times daily as needed for cough.  Dispense: 20 capsule; Refill: 0   Kiara Brady Asad A. Faylene KurtzShah [facility] 11/05/2015 3:47 PM

## 2015-11-17 ENCOUNTER — Other Ambulatory Visit: Payer: Self-pay | Admitting: Family Medicine

## 2015-11-17 DIAGNOSIS — Z1231 Encounter for screening mammogram for malignant neoplasm of breast: Secondary | ICD-10-CM

## 2015-12-01 ENCOUNTER — Encounter: Payer: Self-pay | Admitting: Family Medicine

## 2015-12-01 ENCOUNTER — Ambulatory Visit (INDEPENDENT_AMBULATORY_CARE_PROVIDER_SITE_OTHER): Payer: Medicare Other | Admitting: Family Medicine

## 2015-12-01 VITALS — BP 126/68 | HR 58 | Temp 98.9°F | Resp 18 | Ht 62.0 in | Wt 127.3 lb

## 2015-12-01 DIAGNOSIS — E1169 Type 2 diabetes mellitus with other specified complication: Secondary | ICD-10-CM

## 2015-12-01 DIAGNOSIS — E119 Type 2 diabetes mellitus without complications: Secondary | ICD-10-CM

## 2015-12-01 DIAGNOSIS — E785 Hyperlipidemia, unspecified: Secondary | ICD-10-CM

## 2015-12-01 LAB — POCT GLYCOSYLATED HEMOGLOBIN (HGB A1C): HEMOGLOBIN A1C: 6.2

## 2015-12-01 LAB — GLUCOSE, POCT (MANUAL RESULT ENTRY): POC Glucose: 119 mg/dl — AB (ref 70–99)

## 2015-12-01 MED ORDER — SIMVASTATIN 10 MG PO TABS: 10.0000 mg | ORAL_TABLET | Freq: Every day | ORAL | Status: DC

## 2015-12-01 MED ORDER — METFORMIN HCL ER 500 MG PO TB24: 500.0000 mg | ORAL_TABLET | Freq: Every day | ORAL | Status: DC

## 2015-12-01 NOTE — Progress Notes (Signed)
Name: Kiara Brady   MRN: [id number]    DOB: 06-06-1946   Date:12/01/2015       Progress Note  Subjective  Chief Complaint  Chief Complaint  Patient presents with  . Diabetes    pt here for 3 month follow up    Diabetes She presents for her follow-up diabetic visit. She has type 2 diabetes mellitus. Her disease course has been stable. Pertinent negatives for diabetes include no fatigue, no polydipsia and no polyuria. Symptoms are stable. Current diabetic treatment includes oral agent (monotherapy). She is following a diabetic diet. An ACE inhibitor/angiotensin II receptor blocker is not being taken.     Past Medical History  Diagnosis Date  . Hyperlipidemia   . Diabetes mellitus without complication [facility])     Past Surgical History  Procedure Laterality Date  . Varicose vein surgery      Family History  Problem Relation Age of Onset  . Heart disease Mother   . Alzheimer's disease Mother   . Alcohol abuse Father   . Cancer Maternal Grandmother     breast    Social History   Social History  . Marital Status: Divorced    Spouse Name: N/A  . Number of Children: N/A  . Years of Education: N/A   Occupational History  . Not on file.   Social History Main Topics  . Smoking status: Former Games developer  . Smokeless tobacco: Never Used  . Alcohol Use: No  . Drug Use: No  . Sexual Activity: Not Currently   Other Topics Concern  . Not on file   Social History Narrative     Current outpatient prescriptions:  .  Calcium Citrate-Vitamin D (CALCIUM CITRATE + D) 315-250 MG-UNIT TABS, Take by mouth., Disp: , Rfl:  .  metFORMIN (GLUCOPHAGE-XR) 500 MG 24 hr tablet, Take 1 tablet (500 mg total) by mouth daily., Disp: 90 tablet, Rfl: 0 .  naproxen (NAPROSYN) 500 MG tablet, Take 1 tablet (500 mg total) by mouth 2 (two) times daily with a meal., Disp: 14 tablet, Rfl: 0 .  simvastatin (ZOCOR) 10 MG tablet, Take 1 tablet (10 mg total) by mouth at bedtime., Disp: 90 tablet, Rfl:  0  Allergies  Allergen Reactions  . Penicillin G Hives and Shortness Of Breath  . Penicillins   . Prednisone Cough     Review of Systems  Constitutional: Negative for fatigue.  Endo/Heme/Allergies: Negative for polydipsia.     Objective  Filed Vitals:   12/01/15 1453  BP: 126/68  Pulse: 58  Temp: 98.9 F (37.2 C)  Resp: 18  Height:  (1.575 m)  Weight: 127 lb 5 oz (57.749 kg)  SpO2: 96%    Physical Exam  Constitutional: She is well-developed, well-nourished, and in no distress.  HENT:  Head: Normocephalic and atraumatic.  Cardiovascular: Normal rate and regular rhythm.   Murmur heard.  Systolic murmur is present with a grade of 2/6  Pulmonary/Chest: Effort normal and breath sounds normal.  Nursing note and vitals reviewed.   Assessment & Plan  1. Type 2 diabetes mellitus without complication, without long-term current use of insulin (HCC)  - metFORMIN (GLUCOPHAGE-XR) 500 MG 24 hr tablet; Take 1 tablet (500 mg total) by mouth daily.  Dispense: 90 tablet; Refill: 0 - POCT HgB A1C - POCT Glucose (CBG)  2. Dyslipidemia associated with type 2 diabetes mellitus (HCC) FLP reviewed, continue on statin therapy. - simvastatin (ZOCOR) 10 MG tablet; Take 1 tablet (10 mg total) by mouth  at bedtime.  Dispense: 90 tablet; Refill: 0   Demaya Hardge Asad A. Faylene KurtzShah [facility] 12/01/2015 3:11 PM

## 2015-12-02 ENCOUNTER — Other Ambulatory Visit: Payer: Self-pay | Admitting: Family Medicine

## 2015-12-02 ENCOUNTER — Ambulatory Visit
Admission: RE | Admit: 2015-12-02 | Discharge: 2015-12-02 | Disposition: A | Discharge status: Home / Self Care | Payer: Medicare Other | Source: Ambulatory Visit | Attending: Family Medicine | Admitting: Family Medicine

## 2015-12-02 DIAGNOSIS — Z1231 Encounter for screening mammogram for malignant neoplasm of breast: Secondary | ICD-10-CM

## 2016-01-15 ENCOUNTER — Encounter: Payer: Self-pay | Admitting: Family Medicine

## 2016-01-15 ENCOUNTER — Ambulatory Visit (INDEPENDENT_AMBULATORY_CARE_PROVIDER_SITE_OTHER): Payer: Medicare Other | Admitting: Family Medicine

## 2016-01-15 VITALS — BP 121/69 | HR 67 | Temp 99.5°F | Resp 15 | Ht 62.0 in | Wt 124.6 lb

## 2016-01-15 DIAGNOSIS — J209 Acute bronchitis, unspecified: Secondary | ICD-10-CM | POA: Insufficient documentation

## 2016-01-15 MED ORDER — AZITHROMYCIN 250 MG PO TABS: ORAL_TABLET | ORAL | Status: DC

## 2016-01-15 NOTE — Progress Notes (Signed)
Name: Kiara Brady   MRN: [id number]    DOB: August 03, 1945   Date:01/15/2016       Progress Note  Subjective  Chief Complaint  Chief Complaint  Patient presents with  . Acute Visit    Sore throat, body aches    Sore Throat  This is a new problem. The current episode started in the past 7 days (3 days). Maximum temperature: has not measured it but feels warm and cold at night and believes that she is running a fever. Associated symptoms include congestion and coughing (congestion at night, mainly felt to be centered in the chest, clear sputum.).    Past Medical History  Diagnosis Date  . Hyperlipidemia   . Diabetes mellitus without complication [facility])     Past Surgical History  Procedure Laterality Date  . Varicose vein surgery      Family History  Problem Relation Age of Onset  . Heart disease Mother   . Alzheimer's disease Mother   . Alcohol abuse Father   . Cancer Maternal Grandmother     breast  . Breast cancer Maternal Grandmother     Social History   Social History  . Marital Status: Divorced    Spouse Name: N/A  . Number of Children: N/A  . Years of Education: N/A   Occupational History  . Not on file.   Social History Main Topics  . Smoking status: Former Games developer  . Smokeless tobacco: Never Used  . Alcohol Use: No  . Drug Use: No  . Sexual Activity: Not Currently   Other Topics Concern  . Not on file   Social History Narrative     Current outpatient prescriptions:  .  ACCU-CHEK AVIVA PLUS test strip, , Disp: , Rfl:  .  ACCU-CHEK SOFTCLIX LANCETS lancets, , Disp: , Rfl:  .  Calcium Citrate-Vitamin D (CALCIUM CITRATE + D) 315-250 MG-UNIT TABS, Take by mouth., Disp: , Rfl:  .  metFORMIN (GLUCOPHAGE-XR) 500 MG 24 hr tablet, Take 1 tablet (500 mg total) by mouth daily., Disp: 90 tablet, Rfl: 0 .  simvastatin (ZOCOR) 10 MG tablet, Take 1 tablet (10 mg total) by mouth at bedtime., Disp: 90 tablet, Rfl: 0  Allergies  Allergen Reactions  . Penicillin G  Hives and Shortness Of Breath  . Penicillins   . Prednisone Cough     Review of Systems  Constitutional: Positive for fever.  HENT: Positive for congestion and sore throat.   Respiratory: Positive for cough (congestion at night, mainly felt to be centered in the chest, clear sputum.).   Cardiovascular: Positive for chest pain.  Musculoskeletal: Positive for myalgias.    Objective  Filed Vitals:   01/15/16 1424  BP: 121/69  Pulse: 67  Temp: 99.5 F (37.5 C)  TempSrc: Oral  Resp: 15  Height:  (1.575 m)  Weight: 124 lb 9.6 oz (56.518 kg)  SpO2: 97%    Physical Exam  Constitutional: She is well-developed, well-nourished, and in no distress.  Cardiovascular: Normal rate, regular rhythm and normal heart sounds.   Pulmonary/Chest: Effort normal. She has no decreased breath sounds. She has wheezes in the right lower field. She has no rhonchi.  Nursing note and vitals reviewed.    Assessment & Plan  1. Acute bronchitis, unspecified organism Has taken azithromycin in the past, good relief of symptoms. We'll prescribe Z-Pak, advised to take Tylenol as needed for fever. Increase fluid intake and may take OTC antitussive. - azithromycin (ZITHROMAX) 250 MG tablet; 2  tabs po day 1, then 1 tab po q day x 4 days  Dispense: 6 tablet; Refill: 0   Reniyah Gootee Asad A. Faylene KurtzShah [facility] 01/15/2016 3:46 PM

## 2016-01-21 ENCOUNTER — Telehealth: Payer: Self-pay | Admitting: Family Medicine

## 2016-01-21 NOTE — Telephone Encounter (Signed)
Please schedule for an appointment to reevaluate her symptoms, I have opening on Friday between 920 and 11 AM.

## 2016-01-21 NOTE — Telephone Encounter (Signed)
Patient was just seen last week and is not better. Have cough and think it could be bronchitis. She is asking that you call something in to [facility]. You and the other providers are completely booked for the remainder of the month.

## 2016-01-22 NOTE — Telephone Encounter (Signed)
Appt made for 01-23-16

## 2016-01-23 ENCOUNTER — Ambulatory Visit
Admission: RE | Admit: 2016-01-23 | Discharge: 2016-01-23 | Disposition: A | Discharge status: Home / Self Care | Payer: Medicare Other | Source: Ambulatory Visit | Attending: Family Medicine | Admitting: Family Medicine

## 2016-01-23 ENCOUNTER — Encounter: Payer: Self-pay | Admitting: Family Medicine

## 2016-01-23 ENCOUNTER — Ambulatory Visit (INDEPENDENT_AMBULATORY_CARE_PROVIDER_SITE_OTHER): Payer: Medicare Other | Admitting: Family Medicine

## 2016-01-23 VITALS — BP 120/80 | HR 69 | Temp 98.3°F | Resp 16 | Ht 62.0 in | Wt 124.3 lb

## 2016-01-23 DIAGNOSIS — R918 Other nonspecific abnormal finding of lung field: Secondary | ICD-10-CM | POA: Diagnosis not present

## 2016-01-23 DIAGNOSIS — J209 Acute bronchitis, unspecified: Secondary | ICD-10-CM

## 2016-01-23 DIAGNOSIS — J189 Pneumonia, unspecified organism: Secondary | ICD-10-CM

## 2016-01-23 DIAGNOSIS — I700 Atherosclerosis of aorta: Secondary | ICD-10-CM | POA: Diagnosis not present

## 2016-01-23 DIAGNOSIS — R011 Cardiac murmur, unspecified: Secondary | ICD-10-CM | POA: Insufficient documentation

## 2016-01-23 DIAGNOSIS — R058 Other specified cough: Secondary | ICD-10-CM

## 2016-01-23 DIAGNOSIS — R010 Benign and innocent cardiac murmurs: Secondary | ICD-10-CM

## 2016-01-23 DIAGNOSIS — R05 Cough: Secondary | ICD-10-CM | POA: Diagnosis not present

## 2016-01-23 DIAGNOSIS — J181 Lobar pneumonia, unspecified organism: Secondary | ICD-10-CM

## 2016-01-23 MED ORDER — MOXIFLOXACIN HCL 400 MG PO TABS: 400.0000 mg | ORAL_TABLET | Freq: Every day | ORAL | Status: DC

## 2016-01-23 MED ORDER — AZITHROMYCIN 250 MG PO TABS: ORAL_TABLET | ORAL | Status: DC

## 2016-01-23 MED ORDER — GUAIFENESIN-CODEINE 100-10 MG/5ML PO SYRP: 10.0000 mL | ORAL_SOLUTION | Freq: Three times a day (TID) | ORAL | Status: DC | PRN

## 2016-01-23 NOTE — Progress Notes (Signed)
Name: Kiara Brady   MRN: [id number]    DOB: 08/01/1946   Date:01/23/2016       Progress Note  Subjective  Chief Complaint  Chief Complaint  Patient presents with  . Cough    possible bronchitis seen 2 weeks ago, no better    Cough This is a new problem. The current episode started in the past 7 days. The problem has been unchanged. The cough is productive of sputum (Yellowish mucus.). Associated symptoms include chills. Pertinent negatives include no chest pain, fever, nasal congestion, shortness of breath or wheezing. The symptoms are aggravated by lying down. She has tried rest (Has finished a course of Azithromycin for bronchitis, coughing started after she finished Azithromycin. Has taken Halls' cough drops.) for the symptoms. The treatment provided moderate relief.    Past Medical History  Diagnosis Date  . Hyperlipidemia   . Diabetes mellitus without complication [facility](HCC)     Past Surgical History  Procedure Laterality Date  . Varicose vein surgery      Family History  Problem Relation Age of Onset  . Heart disease Mother   . Alzheimer's disease Mother   . Alcohol abuse Father   . Cancer Maternal Grandmother     breast  . Breast cancer Maternal Grandmother     Social History   Social History  . Marital Status: Divorced    Spouse Name: N/A  . Number of Children: N/A  . Years of Education: N/A   Occupational History  . Not on file.   Social History Main Topics  . Smoking status: Former Games developermoker  . Smokeless tobacco: Never Used  . Alcohol Use: No  . Drug Use: No  . Sexual Activity: Not Currently   Other Topics Concern  . Not on file   Social History Narrative     Current outpatient prescriptions:  .  ACCU-CHEK AVIVA PLUS test strip, , Disp: , Rfl:  .  ACCU-CHEK SOFTCLIX LANCETS lancets, , Disp: , Rfl:  .  Calcium Citrate-Vitamin D (CALCIUM CITRATE + D) 315-250 MG-UNIT TABS, Take by mouth., Disp: , Rfl:  .  metFORMIN (GLUCOPHAGE-XR) 500 MG 24 hr  tablet, Take 1 tablet (500 mg total) by mouth daily., Disp: 90 tablet, Rfl: 0 .  simvastatin (ZOCOR) 10 MG tablet, Take 1 tablet (10 mg total) by mouth at bedtime., Disp: 90 tablet, Rfl: 0  Allergies  Allergen Reactions  . Penicillin G Hives and Shortness Of Breath  . Penicillins   . Prednisone Cough     Review of Systems  Constitutional: Positive for chills. Negative for fever.  Respiratory: Positive for cough. Negative for shortness of breath and wheezing.   Cardiovascular: Negative for chest pain.     Objective  Filed Vitals:   01/23/16 1033  BP: 120/80  Pulse: 69  Temp: 98.3 F (36.8 C)  TempSrc: Oral  Resp: 16  Height: 5\' 2"  (1.575 m)  Weight: 124 lb 4.8 oz (56.382 kg)  SpO2: 97%    Physical Exam  Constitutional: She is oriented to person, place, and time and well-developed, well-nourished, and in no distress.  Cardiovascular: Normal rate, regular rhythm, S1 normal and S2 normal.   Murmur heard.  Systolic murmur is present with a grade of 2/6  Pulmonary/Chest: Effort normal and breath sounds normal. No respiratory distress. She has no decreased breath sounds. She has no wheezes. She has no rales.  Neurological: She is alert and oriented to person, place, and time.  Psychiatric: Mood, memory, affect and  judgment normal.  Nursing note and vitals reviewed.     Assessment & Plan  1. Productive cough Start on antitussive therapy for cough. - guaiFENesin-codeine (CHERATUSSIN AC) 100-10 MG/5ML syrup; Take 10 mLs by mouth 3 (three) times daily as needed for cough.  Dispense: 210 mL; Refill: 0  2. Cardiac murmur, previously undiagnosed  - Ambulatory referral to Cardiology  3. Acute bronchitis, unspecified organism  - DG Chest 2 View; Future  4. RLL pneumonia Chest x-ray shows a possible early infiltrate at the right lung base. We will DC azithromycin and start on moxifloxacin for broader coverage. Left a voice message for patient - moxifloxacin (AVELOX) 400  MG tablet; Take 1 tablet (400 mg total) by mouth daily at 8 pm.  Dispense: 5 tablet; Refill: 0  Kacy Hegna Asad A. Faylene KurtzShah [facility] 01/23/2016 10:39 AM

## 2016-01-26 ENCOUNTER — Telehealth: Payer: Self-pay | Admitting: Family Medicine

## 2016-01-26 NOTE — Telephone Encounter (Signed)
PT ASKING FOR TEST RESULTS

## 2016-01-27 NOTE — Telephone Encounter (Signed)
Results have been reported to patient also left on voice mail

## 2016-02-25 ENCOUNTER — Ambulatory Visit: Payer: Self-pay | Admitting: Cardiology

## 2016-03-02 ENCOUNTER — Ambulatory Visit: Payer: Medicare Other | Admitting: Family Medicine

## 2016-03-23 ENCOUNTER — Encounter: Payer: Self-pay | Admitting: Family Medicine

## 2016-03-23 ENCOUNTER — Ambulatory Visit (INDEPENDENT_AMBULATORY_CARE_PROVIDER_SITE_OTHER): Payer: Medicare Other | Admitting: Family Medicine

## 2016-03-23 VITALS — BP 120/72 | HR 60 | Temp 98.4°F | Resp 16 | Ht 62.0 in | Wt 126.4 lb

## 2016-03-23 DIAGNOSIS — E785 Hyperlipidemia, unspecified: Secondary | ICD-10-CM

## 2016-03-23 DIAGNOSIS — E119 Type 2 diabetes mellitus without complications: Secondary | ICD-10-CM | POA: Diagnosis not present

## 2016-03-23 DIAGNOSIS — S76311A Strain of muscle, fascia and tendon of the posterior muscle group at thigh level, right thigh, initial encounter: Secondary | ICD-10-CM | POA: Diagnosis not present

## 2016-03-23 DIAGNOSIS — S76011A Strain of muscle, fascia and tendon of right hip, initial encounter: Secondary | ICD-10-CM

## 2016-03-23 LAB — POCT GLYCOSYLATED HEMOGLOBIN (HGB A1C): Hemoglobin A1C: 6.1

## 2016-03-23 LAB — GLUCOSE, POCT (MANUAL RESULT ENTRY): POC Glucose: 99 mg/dl (ref 70–99)

## 2016-03-23 MED ORDER — ATORVASTATIN CALCIUM 10 MG PO TABS: 10.0000 mg | ORAL_TABLET | Freq: Every day | ORAL | 0 refills | Status: DC

## 2016-03-23 NOTE — Progress Notes (Signed)
Name: Kiara Brady   MRN: [id number]    DOB: 12/01/1945   Date:03/23/2016       Progress Note  Subjective  Chief Complaint  Chief Complaint  Patient presents with  . Diabetes    3 month follow up  . Hyperlipidemia    Diabetes  She presents for her follow-up diabetic visit. She has type 2 diabetes mellitus. Her disease course has been stable. Pertinent negatives for diabetes include no chest pain, no fatigue, no polydipsia and no polyuria. Symptoms are improving. Current diabetic treatment includes oral agent (monotherapy).  Hyperlipidemia  This is a chronic problem. The problem is controlled. Recent lipid tests were reviewed and are normal. Pertinent negatives include no chest pain, leg pain, myalgias or shortness of breath. (Feels like her memory is not what it used to be, has read up on side effects of statins and wondering if Simvastatin is causing her memory issues) Current antihyperlipidemic treatment includes statins.   Right Hip Pain: Symptoms include progressively worsening right gluteal and back pain, present for 1 week, symptoms wax and wane. Worse with sitting for a long time, relieved with standing up and walking. No radiation down the right leg. She has tried talking Tylenol for the pain which did not help.   Past Medical History:  Diagnosis Date  . Diabetes mellitus without complication (HCC)   . Hyperlipidemia     Past Surgical History:  Procedure Laterality Date  . VARICOSE VEIN SURGERY      Family History  Problem Relation Age of Onset  . Heart disease Mother   . Alzheimer's disease Mother   . Alcohol abuse Father   . Cancer Maternal Grandmother     breast  . Breast cancer Maternal Grandmother     Social History   Social History  . Marital status: Divorced    Spouse name: N/A  . Number of children: N/A  . Years of education: N/A   Occupational History  . Not on file.   Social History Main Topics  . Smoking status: Former Games developermoker  . Smokeless  tobacco: Never Used  . Alcohol use No  . Drug use: No  . Sexual activity: Not Currently   Other Topics Concern  . Not on file   Social History Narrative  . No narrative on file     Current Outpatient Prescriptions:  .  ACCU-CHEK AVIVA PLUS test strip, , Disp: , Rfl:  .  ACCU-CHEK SOFTCLIX LANCETS lancets, , Disp: , Rfl:  .  Calcium Citrate-Vitamin D (CALCIUM CITRATE + D) 315-250 MG-UNIT TABS, Take by mouth., Disp: , Rfl:  .  metFORMIN (GLUCOPHAGE-XR) 500 MG 24 hr tablet, Take 1 tablet (500 mg total) by mouth daily., Disp: 90 tablet, Rfl: 0 .  simvastatin (ZOCOR) 10 MG tablet, Take 1 tablet (10 mg total) by mouth at bedtime., Disp: 90 tablet, Rfl: 0  Allergies  Allergen Reactions  . Penicillin G Hives and Shortness Of Breath  . Penicillins   . Prednisone Cough    Review of Systems  Constitutional: Negative for fatigue.  Respiratory: Negative for shortness of breath.   Cardiovascular: Negative for chest pain.  Musculoskeletal: Negative for myalgias.  Endo/Heme/Allergies: Negative for polydipsia.    Objective  Vitals:   03/23/16 1501  BP: 120/72  Pulse: 60  Resp: 16  Temp: 98.4 F (36.9 C)  TempSrc: Oral  SpO2: 97%  Weight: 126 lb 6.4 oz (57.3 kg)  Height: 5\' 2"  (1.575 m)    Physical Exam  Constitutional: She is oriented to person, place, and time and well-developed, well-nourished, and in no distress.  Cardiovascular: Normal rate, regular rhythm, S1 normal, S2 normal and normal heart sounds.   No murmur heard. Pulmonary/Chest: Effort normal and breath sounds normal. She has no wheezes. She has no rhonchi.  Abdominal: Soft. Bowel sounds are normal. There is no tenderness. There is no rebound.  Musculoskeletal:       Right hip: She exhibits tenderness. She exhibits normal range of motion.       Legs: Tenderness to palpation over the right gluteal muscle, right lower back, normal ROM of right leg.  Neurological: She is alert and oriented to person, place, and  time.  Psychiatric: Mood, memory, affect and judgment normal.  Nursing note and vitals reviewed.      Recent Results (from the past 2160 hour(s))  POCT HgB A1C     Status: None   Collection Time: 03/23/16  3:18 PM  Result Value Ref Range   Hemoglobin A1C 6.1   POCT Glucose (CBG)     Status: None   Collection Time: 03/23/16  3:18 PM  Result Value Ref Range   POC Glucose 99 70 - 99 mg/dl     Assessment & Plan  1. Type 2 diabetes mellitus without complication, without long-term current use of insulin (HCC) A1c 6.1%, considered well-controlled diabetes. No change from therapy - POCT HgB A1C - POCT Glucose (CBG)  2. Hyperlipidemia Changed from simvastatin to atorvastatin given patient's complaints of subjective memory impairment while on simvastatin. Obtain liver enzymes. - atorvastatin (LIPITOR) 10 MG tablet; Take 1 tablet (10 mg total) by mouth daily at 6 PM.  Dispense: 90 tablet; Refill: 0 - Lipid Profile - COMPLETE METABOLIC PANEL WITH GFR  3. Muscle strain of right gluteal region, initial encounter Likely from sitting down on a hard surface causing compression of the gluteal muscle. Advised to take frequent breaks, sit down on a soft padded surface, may take NSAIDs as needed.  Kiara Brady A. Faylene KurtzShah [facility] 03/23/2016 3:20 PM

## 2016-03-24 ENCOUNTER — Telehealth: Payer: Self-pay | Admitting: Family Medicine

## 2016-03-24 DIAGNOSIS — E119 Type 2 diabetes mellitus without complications: Secondary | ICD-10-CM

## 2016-03-26 MED ORDER — METFORMIN HCL ER 500 MG PO TB24: 500.0000 mg | ORAL_TABLET | Freq: Every day | ORAL | 0 refills | Status: DC

## 2016-03-26 NOTE — Telephone Encounter (Signed)
Medication has been refilled and sent to [facility] [address] °

## 2016-05-17 ENCOUNTER — Telehealth: Payer: Self-pay | Admitting: Family Medicine

## 2016-05-17 NOTE — Telephone Encounter (Signed)
Patient request a prescription for cold medication: itchy throat, runny nose and coughing, and sometimes in the afternoon chills. All of this began Friday. She think it is more allergy related (Have been trying Acetaminophen). Please send to [email or web address]

## 2016-05-17 NOTE — Telephone Encounter (Signed)
Please schedule for an office visit appointment to evaluate her symptoms.

## 2016-05-18 NOTE — Telephone Encounter (Signed)
Left voice message informing patient to return call to schedule appointment.

## 2016-05-19 ENCOUNTER — Encounter: Payer: Self-pay | Admitting: Family Medicine

## 2016-05-19 ENCOUNTER — Ambulatory Visit (INDEPENDENT_AMBULATORY_CARE_PROVIDER_SITE_OTHER): Payer: Medicare Other | Admitting: Family Medicine

## 2016-05-19 ENCOUNTER — Telehealth: Payer: Self-pay | Admitting: Family Medicine

## 2016-05-19 VITALS — BP 122/67 | HR 60 | Temp 98.3°F | Resp 16 | Ht 62.0 in | Wt 124.1 lb

## 2016-05-19 DIAGNOSIS — J302 Other seasonal allergic rhinitis: Secondary | ICD-10-CM

## 2016-05-19 MED ORDER — FEXOFENADINE HCL 180 MG PO TABS: 180.0000 mg | ORAL_TABLET | Freq: Every day | ORAL | 0 refills | Status: DC

## 2016-05-19 NOTE — Progress Notes (Signed)
Name: Kiara Brady   MRN: [id number]    DOB: 15-Dec-1945   Date:05/19/2016       Progress Note  Subjective  Chief Complaint  Chief Complaint  Patient presents with  . Nasal Congestion  . Allergies    Cough  This is a new problem. The current episode started in the past 7 days. The problem has been gradually improving. The cough is productive of sputum. Associated symptoms include myalgias, rhinorrhea and a sore throat. Pertinent negatives include no fever, headaches or nasal congestion. She has tried OTC cough suppressant for the symptoms.     Past Medical History:  Diagnosis Date  . Diabetes mellitus without complication (HCC)   . Hyperlipidemia     Past Surgical History:  Procedure Laterality Date  . VARICOSE VEIN SURGERY      Family History  Problem Relation Age of Onset  . Heart disease Mother   . Alzheimer's disease Mother   . Alcohol abuse Father   . Cancer Maternal Grandmother     breast  . Breast cancer Maternal Grandmother     Social History   Social History  . Marital status: Divorced    Spouse name: N/A  . Number of children: N/A  . Years of education: N/A   Occupational History  . Not on file.   Social History Main Topics  . Smoking status: Former Games developer  . Smokeless tobacco: Never Used  . Alcohol use No  . Drug use: No  . Sexual activity: Not Currently   Other Topics Concern  . Not on file   Social History Narrative  . No narrative on file     Current Outpatient Prescriptions:  .  ACCU-CHEK AVIVA PLUS test strip, , Disp: , Rfl:  .  ACCU-CHEK SOFTCLIX LANCETS lancets, , Disp: , Rfl:  .  atorvastatin (LIPITOR) 10 MG tablet, Take 1 tablet (10 mg total) by mouth daily at 6 PM., Disp: 90 tablet, Rfl: 0 .  metFORMIN (GLUCOPHAGE-XR) 500 MG 24 hr tablet, Take 1 tablet (500 mg total) by mouth daily., Disp: 90 tablet, Rfl: 0 .  Calcium Citrate-Vitamin D (CALCIUM CITRATE + D) 315-250 MG-UNIT TABS, Take by mouth., Disp: , Rfl:   Allergies   Allergen Reactions  . Penicillin G Hives and Shortness Of Breath  . Penicillins   . Prednisone Cough     Review of Systems  Constitutional: Negative for fever.  HENT: Positive for rhinorrhea and sore throat.   Respiratory: Positive for cough.   Musculoskeletal: Positive for myalgias.  Neurological: Negative for headaches.    Objective  Vitals:   05/19/16 1534  BP: 122/67  Pulse: 60  Resp: 16  Temp: 98.3 F (36.8 C)  TempSrc: Oral  SpO2: 98%  Weight: 124 lb 1.6 oz (56.3 kg)  Height: 5\' 2"  (1.575 m)    Physical Exam  Constitutional: She is well-developed, well-nourished, and in no distress.  HENT:  Head: Normocephalic and atraumatic.  Mouth/Throat: Posterior oropharyngeal erythema present.  Nasal mucosal inflammation, turbinate hypertrophy.  Cardiovascular: Normal rate, regular rhythm, S1 normal, S2 normal and normal heart sounds.   Pulmonary/Chest: Effort normal and breath sounds normal. She has no wheezes.  Nursing note and vitals reviewed.   Assessment & Plan  1. Acute seasonal allergic rhinitis, unspecified trigger Versus an acute viral upper respiratory infection. Advised to continue with OTC cold and flu medication, may take Allegra for antihistamine effect. No antibiotics indicated at this point - fexofenadine (ALLEGRA) 180 MG tablet; Take 1  tablet (180 mg total) by mouth daily.  Dispense: 10 tablet; Refill: 0   Emmy Keng Asad A. Faylene KurtzShah [facility] 05/19/2016 3:51 PM

## 2016-05-19 NOTE — Telephone Encounter (Signed)
Based on patient's symptoms, she either has a cold caused by a virus for environmental allergies. Neither of these conditions warrant an antibiotic. She may continue taking OTC medications for symptomatic relief, she can try Zyrtec for relief of allergies. No antibiotics are necessary at this stage in her illness.

## 2016-05-19 NOTE — Telephone Encounter (Signed)
Patient was seen today. Was prescribed fexofenadine and states that she has tried this before and it did not work. States she thought that you where giving her an antibiotic and is requesting that you please send to [facility].

## 2016-05-20 NOTE — Telephone Encounter (Signed)
Left voice message for patient to return call. °

## 2016-05-20 NOTE — Telephone Encounter (Signed)
Patient returned call and was verbally informed.

## 2016-06-28 ENCOUNTER — Encounter: Payer: Self-pay | Admitting: Family Medicine

## 2016-06-28 ENCOUNTER — Ambulatory Visit (INDEPENDENT_AMBULATORY_CARE_PROVIDER_SITE_OTHER): Payer: Medicare Other | Admitting: Family Medicine

## 2016-06-28 VITALS — BP 130/68 | HR 61 | Temp 98.3°F | Resp 16 | Ht 62.0 in | Wt 126.9 lb

## 2016-06-28 DIAGNOSIS — E119 Type 2 diabetes mellitus without complications: Secondary | ICD-10-CM | POA: Diagnosis not present

## 2016-06-28 DIAGNOSIS — E1169 Type 2 diabetes mellitus with other specified complication: Secondary | ICD-10-CM

## 2016-06-28 DIAGNOSIS — R0989 Other specified symptoms and signs involving the circulatory and respiratory systems: Secondary | ICD-10-CM

## 2016-06-28 DIAGNOSIS — E785 Hyperlipidemia, unspecified: Secondary | ICD-10-CM | POA: Diagnosis not present

## 2016-06-28 LAB — POCT GLYCOSYLATED HEMOGLOBIN (HGB A1C): HEMOGLOBIN A1C: 6

## 2016-06-28 MED ORDER — AZITHROMYCIN 250 MG PO TABS: ORAL_TABLET | ORAL | 0 refills | Status: DC

## 2016-06-28 MED ORDER — ATORVASTATIN CALCIUM 10 MG PO TABS: 10.0000 mg | ORAL_TABLET | Freq: Every day | ORAL | 0 refills | Status: DC

## 2016-06-28 MED ORDER — METFORMIN HCL ER 500 MG PO TB24: 500.0000 mg | ORAL_TABLET | Freq: Every day | ORAL | 0 refills | Status: DC

## 2016-06-28 NOTE — Progress Notes (Signed)
Name: Kiara Brady   MRN: [id number]    DOB: 03/12/1946   Date:06/28/2016       Progress Note  Subjective  Chief Complaint  Chief Complaint  Patient presents with  . Diabetes    3 month follow up  . Hyperlipidemia    Diabetes  She presents for her follow-up diabetic visit. She has type 2 diabetes mellitus. Her disease course has been stable. There are no hypoglycemic associated symptoms. Associated symptoms include chest pain and polyuria. Pertinent negatives for diabetes include no fatigue and no polydipsia. Pertinent negatives for diabetic complications include no CVA or heart disease. Current diabetic treatment includes oral agent (monotherapy). She is following a diabetic diet. Frequency home blood tests: does not check her blood glucose. An ACE inhibitor/angiotensin II receptor blocker is not being taken.  Hyperlipidemia  This is a chronic problem. The problem is controlled. Recent lipid tests were reviewed and are normal. Associated symptoms include chest pain. Pertinent negatives include no leg pain, myalgias or shortness of breath. Current antihyperlipidemic treatment includes statins.  Cough  This is a new problem. The cough is productive of sputum. Associated symptoms include chest pain and rhinorrhea. Pertinent negatives include no chills, myalgias, sore throat or shortness of breath. She has tried OTC cough suppressant for the symptoms.     Past Medical History:  Diagnosis Date  . Diabetes mellitus without complication (HCC)   . Hyperlipidemia     Past Surgical History:  Procedure Laterality Date  . VARICOSE VEIN SURGERY      Family History  Problem Relation Age of Onset  . Heart disease Mother   . Alzheimer's disease Mother   . Alcohol abuse Father   . Cancer Maternal Grandmother     breast  . Breast cancer Maternal Grandmother     Social History   Social History  . Marital status: Divorced    Spouse name: N/A  . Number of children: N/A  . Years of  education: N/A   Occupational History  . Not on file.   Social History Main Topics  . Smoking status: Former Games developermoker  . Smokeless tobacco: Never Used  . Alcohol use No  . Drug use: No  . Sexual activity: Not Currently   Other Topics Concern  . Not on file   Social History Narrative  . No narrative on file     Current Outpatient Prescriptions:  .  ACCU-CHEK AVIVA PLUS test strip, , Disp: , Rfl:  .  ACCU-CHEK SOFTCLIX LANCETS lancets, , Disp: , Rfl:  .  atorvastatin (LIPITOR) 10 MG tablet, Take 1 tablet (10 mg total) by mouth daily at 6 PM., Disp: 90 tablet, Rfl: 0 .  Calcium Citrate-Vitamin D (CALCIUM CITRATE + D) 315-250 MG-UNIT TABS, Take by mouth., Disp: , Rfl:  .  fexofenadine (ALLEGRA) 180 MG tablet, Take 1 tablet (180 mg total) by mouth daily., Disp: 10 tablet, Rfl: 0 .  metFORMIN (GLUCOPHAGE-XR) 500 MG 24 hr tablet, Take 1 tablet (500 mg total) by mouth daily., Disp: 90 tablet, Rfl: 0  Allergies  Allergen Reactions  . Penicillin G Hives and Shortness Of Breath  . Penicillins   . Prednisone Cough     Review of Systems  Constitutional: Negative for chills and fatigue.  HENT: Positive for rhinorrhea. Negative for sore throat.   Respiratory: Positive for cough. Negative for shortness of breath.   Cardiovascular: Positive for chest pain.  Musculoskeletal: Negative for myalgias.  Endo/Heme/Allergies: Negative for polydipsia.  Objective  Vitals:   06/28/16 1538  BP: 130/68  Pulse: 61  Resp: 16  Temp: 98.3 F (36.8 C)  TempSrc: Oral  SpO2: 98%  Weight: 126 lb 14.4 oz (57.6 kg)  Height: 5\' 2"  (1.575 m)    Physical Exam  Constitutional: She is oriented to person, place, and time and well-developed, well-nourished, and in no distress.  HENT:  Head: Normocephalic and atraumatic.  Nose: Right sinus exhibits no maxillary sinus tenderness and no frontal sinus tenderness. Left sinus exhibits no maxillary sinus tenderness and no frontal sinus tenderness.   Mouth/Throat: Posterior oropharyngeal erythema present. No oropharyngeal exudate or posterior oropharyngeal edema.  Cardiovascular: Normal rate, regular rhythm and normal heart sounds.   Pulmonary/Chest: Effort normal and breath sounds normal. She has no wheezes.  Abdominal: Soft. Bowel sounds are normal.  Neurological: She is alert and oriented to person, place, and time.  Psychiatric: Mood, memory, affect and judgment normal.  Nursing note and vitals reviewed.      Recent Results (from the past 2160 hour(s))  POCT HgB A1C     Status: None   Collection Time: 06/28/16  3:42 PM  Result Value Ref Range   Hemoglobin A1C 6.0      Assessment & Plan   1. Chest congestion - azithromycin (ZITHROMAX) 250 MG tablet; 2 tabs po day 1, then 1 tab po q day x 4 days  Dispense: 6 tablet; Refill: 0  2. Dyslipidemia associated with type 2 diabetes mellitus (HCC) -atorvastatin (LIPITOR) 10 MG tablet; Take 1 tablet (10 mg total) by mouth daily at 6 PM.  Dispense: 90 tablet; Refill: 0  3. Type 2 diabetes mellitus without complication, without long-term current use of insulin (HCC) Point-of-care A1c 6.0%, well-controlled diabetes - POCT HgB A1C - metFORMIN (GLUCOPHAGE-XR) 500 MG 24 hr tablet; Take 1 tablet (500 mg total) by mouth daily.  Dispense: 90 tablet; Refill: 0 spense: 90 tablet; Refill: 0  Oland Arquette Asad A. Faylene KurtzShah [facility] 06/28/2016 3:48 PM

## 2016-09-14 ENCOUNTER — Telehealth: Payer: Self-pay | Admitting: Family Medicine

## 2016-09-14 NOTE — Telephone Encounter (Signed)
Called Pt to schedule AWV with [facility] - knb °

## 2016-09-16 ENCOUNTER — Ambulatory Visit (INDEPENDENT_AMBULATORY_CARE_PROVIDER_SITE_OTHER): Payer: Medicare Other | Admitting: Family Medicine

## 2016-09-16 ENCOUNTER — Encounter: Payer: Self-pay | Admitting: Family Medicine

## 2016-09-16 DIAGNOSIS — J040 Acute laryngitis: Secondary | ICD-10-CM | POA: Diagnosis not present

## 2016-09-16 MED ORDER — AZITHROMYCIN 250 MG PO TABS: ORAL_TABLET | ORAL | 0 refills | Status: DC

## 2016-09-16 NOTE — Progress Notes (Signed)
Name: Kiara Brady   MRN: [id number]    DOB: 26-Apr-1946   Date:09/16/2016       Progress Note  Subjective  Chief Complaint  Chief Complaint  Patient presents with  . Acute Visit    no voice x1 week    Sore Throat   This is a new problem. The current episode started in the past 7 days (3 days ago). The maximum temperature recorded prior to her arrival was 100.4 - 100.9 F (subjective fever at the onset of illness 7 days ago). Associated symptoms include congestion, coughing, diarrhea (had watery diarrhea at the onset of illness 7 days ago, resolved with 2 days) and a hoarse voice (pt. lost her voice 2 days ago). Pertinent negatives include no shortness of breath, stridor or vomiting.      Past Medical History:  Diagnosis Date  . Diabetes mellitus without complication (HCC)   . Hyperlipidemia     Past Surgical History:  Procedure Laterality Date  . VARICOSE VEIN SURGERY      Family History  Problem Relation Age of Onset  . Heart disease Mother   . Alzheimer's disease Mother   . Alcohol abuse Father   . Cancer Maternal Grandmother     breast  . Breast cancer Maternal Grandmother     Social History   Social History  . Marital status: Divorced    Spouse name: N/A  . Number of children: N/A  . Years of education: N/A   Occupational History  . Not on file.   Social History Main Topics  . Smoking status: Former Games developer  . Smokeless tobacco: Never Used  . Alcohol use No  . Drug use: No  . Sexual activity: Not Currently   Other Topics Concern  . Not on file   Social History Narrative  . No narrative on file     Current Outpatient Prescriptions:  .  ACCU-CHEK AVIVA PLUS test strip, , Disp: , Rfl:  .  ACCU-CHEK SOFTCLIX LANCETS lancets, , Disp: , Rfl:  .  atorvastatin (LIPITOR) 10 MG tablet, Take 1 tablet (10 mg total) by mouth daily at 6 PM., Disp: 90 tablet, Rfl: 0 .  Calcium Citrate-Vitamin D (CALCIUM CITRATE + D) 315-250 MG-UNIT TABS, Take by mouth., Disp:  , Rfl:  .  fexofenadine (ALLEGRA) 180 MG tablet, Take 1 tablet (180 mg total) by mouth daily., Disp: 10 tablet, Rfl: 0 .  metFORMIN (GLUCOPHAGE-XR) 500 MG 24 hr tablet, Take 1 tablet (500 mg total) by mouth daily., Disp: 90 tablet, Rfl: 0 .  azithromycin (ZITHROMAX) 250 MG tablet, 2 tabs po day 1, then 1 tab po q day x 4 days (Patient not taking: Reported on 09/16/2016), Disp: 6 tablet, Rfl: 0  Allergies  Allergen Reactions  . Penicillin G Hives and Shortness Of Breath  . Penicillins   . Prednisone Cough     Review of Systems  Constitutional: Positive for fever.  HENT: Positive for congestion, hoarse voice (pt. lost her voice 2 days ago) and sore throat.   Respiratory: Positive for cough. Negative for shortness of breath and stridor.   Gastrointestinal: Positive for diarrhea (had watery diarrhea at the onset of illness 7 days ago, resolved with 2 days). Negative for vomiting.    Objective  Vitals:   09/16/16 1419  BP: 130/76  Pulse: 61  Resp: 15  Temp: 99.1 F (37.3 C)  TempSrc: Oral  SpO2: 98%  Weight: 122 lb 1.6 oz (55.4 kg)  Height: 5\' 2"  (  1.575 m)    Physical Exam  Constitutional: She is oriented to person, place, and time and well-developed, well-nourished, and in no distress.  HENT:  Nose: Right sinus exhibits no maxillary sinus tenderness. Left sinus exhibits no maxillary sinus tenderness.  Mouth/Throat: Posterior oropharyngeal erythema present. No oropharyngeal exudate or posterior oropharyngeal edema.  Neck: Neck supple.  Cardiovascular: Normal rate, regular rhythm, S1 normal, S2 normal and normal heart sounds.   No murmur heard. Pulmonary/Chest: Effort normal and breath sounds normal. She has no wheezes.  Neurological: She is alert and oriented to person, place, and time.  Psychiatric: Mood, memory, affect and judgment normal.  Nursing note and vitals reviewed.     Recent Results (from the past 2160 hour(s))  POCT HgB A1C     Status: None   Collection  Time: 06/28/16  3:42 PM  Result Value Ref Range   Hemoglobin A1C 6.0      Assessment & Plan  1. Acute laryngitis Suspect bacterial etiology, especially given concurrent pharyngitis. We will start on azithromycin for treatment. Advised increased fluid intake, use warm green tea for relief. Also prescribed voice rest. - azithromycin (ZITHROMAX) 250 MG tablet; 2 tabs po day 1, then 1 tab po q day x 4 days  Dispense: 6 tablet; Refill: 0   Kasandra Fehr Asad A. Faylene KurtzShah [facility] 09/16/2016 2:31 PM

## 2016-09-28 ENCOUNTER — Encounter: Payer: Self-pay | Admitting: Family Medicine

## 2016-09-28 ENCOUNTER — Ambulatory Visit (INDEPENDENT_AMBULATORY_CARE_PROVIDER_SITE_OTHER): Payer: Medicare Other | Admitting: Family Medicine

## 2016-09-28 VITALS — BP 130/70 | HR 60 | Temp 98.1°F | Resp 16 | Ht 62.0 in | Wt 125.9 lb

## 2016-09-28 DIAGNOSIS — E7800 Pure hypercholesterolemia, unspecified: Secondary | ICD-10-CM | POA: Diagnosis not present

## 2016-09-28 DIAGNOSIS — E785 Hyperlipidemia, unspecified: Secondary | ICD-10-CM

## 2016-09-28 DIAGNOSIS — E119 Type 2 diabetes mellitus without complications: Secondary | ICD-10-CM

## 2016-09-28 DIAGNOSIS — E1169 Type 2 diabetes mellitus with other specified complication: Secondary | ICD-10-CM | POA: Diagnosis not present

## 2016-09-28 LAB — GLUCOSE, POCT (MANUAL RESULT ENTRY): POC Glucose: 88 mg/dl (ref 70–99)

## 2016-09-28 LAB — POCT GLYCOSYLATED HEMOGLOBIN (HGB A1C): HEMOGLOBIN A1C: 6.2

## 2016-09-28 MED ORDER — METFORMIN HCL ER 500 MG PO TB24: 500.0000 mg | ORAL_TABLET | Freq: Every day | ORAL | 0 refills | Status: DC

## 2016-09-28 MED ORDER — ATORVASTATIN CALCIUM 10 MG PO TABS: 10.0000 mg | ORAL_TABLET | Freq: Every day | ORAL | 0 refills | Status: DC

## 2016-09-28 NOTE — Progress Notes (Signed)
Name: Kiara Brady   MRN: [id number]    DOB: 08/30/1945   Date:09/28/2016       Progress Note  Subjective  Chief Complaint  Chief Complaint  Patient presents with  . Follow-up    3 MO    Diabetes  She presents for her follow-up diabetic visit. She has type 2 diabetes mellitus. Her disease course has been stable. Pertinent negatives for diabetes include no chest pain, no fatigue, no polydipsia and no polyuria. Symptoms are improving. Current diabetic treatment includes oral agent (monotherapy). She is following a diabetic diet. Her breakfast blood glucose range is generally 110-130 mg/dl. Eye exam is current.  Hyperlipidemia  This is a chronic problem. The problem is controlled. Recent lipid tests were reviewed and are normal. Pertinent negatives include no chest pain, leg pain, myalgias or shortness of breath. (Feels like her memory is not what it used to be, has read up on side effects of statins and wondering if Simvastatin is causing her memory issues) Current antihyperlipidemic treatment includes statins.     Past Medical History:  Diagnosis Date  . Diabetes mellitus without complication (HCC)   . Hyperlipidemia     Past Surgical History:  Procedure Laterality Date  . VARICOSE VEIN SURGERY      Family History  Problem Relation Age of Onset  . Heart disease Mother   . Alzheimer's disease Mother   . Alcohol abuse Father   . Cancer Maternal Grandmother     breast  . Breast cancer Maternal Grandmother     Social History   Social History  . Marital status: Divorced    Spouse name: N/A  . Number of children: N/A  . Years of education: N/A   Occupational History  . Not on file.   Social History Main Topics  . Smoking status: Former Games developermoker  . Smokeless tobacco: Never Used  . Alcohol use No  . Drug use: No  . Sexual activity: Not Currently   Other Topics Concern  . Not on file   Social History Narrative  . No narrative on file     Current Outpatient  Prescriptions:  .  ACCU-CHEK AVIVA PLUS test strip, , Disp: , Rfl:  .  ACCU-CHEK SOFTCLIX LANCETS lancets, , Disp: , Rfl:  .  atorvastatin (LIPITOR) 10 MG tablet, Take 1 tablet (10 mg total) by mouth daily at 6 PM., Disp: 90 tablet, Rfl: 0 .  fexofenadine (ALLEGRA) 180 MG tablet, Take 1 tablet (180 mg total) by mouth daily., Disp: 10 tablet, Rfl: 0 .  metFORMIN (GLUCOPHAGE-XR) 500 MG 24 hr tablet, Take 1 tablet (500 mg total) by mouth daily., Disp: 90 tablet, Rfl: 0 .  azithromycin (ZITHROMAX) 250 MG tablet, 2 tabs po day 1, then 1 tab po q day x 4 days (Patient not taking: Reported on 09/28/2016), Disp: 6 tablet, Rfl: 0 .  Calcium Citrate-Vitamin D (CALCIUM CITRATE + D) 315-250 MG-UNIT TABS, Take by mouth., Disp: , Rfl:   Allergies  Allergen Reactions  . Penicillin G Hives and Shortness Of Breath  . Penicillins   . Prednisone Cough     Review of Systems  Constitutional: Negative for fatigue.  Respiratory: Negative for shortness of breath.   Cardiovascular: Negative for chest pain.  Musculoskeletal: Negative for myalgias.  Endo/Heme/Allergies: Negative for polydipsia.    Objective  Vitals:   09/28/16 1529  BP: 130/70  Pulse: 60  Resp: 16  Temp: 98.1 F (36.7 C)  TempSrc: Oral  SpO2: 98%  Weight: 125 lb 14.4 oz (57.1 kg)  Height: 5\' 2"  (1.575 m)    Physical Exam  Constitutional: She is oriented to person, place, and time and well-developed, well-nourished, and in no distress.  Cardiovascular: Normal rate, regular rhythm and normal heart sounds.   No murmur heard. Pulmonary/Chest: Effort normal and breath sounds normal. She has no wheezes.  Abdominal: Soft. Bowel sounds are normal. There is no tenderness.  Neurological: She is alert and oriented to person, place, and time.  Psychiatric: Mood, memory, affect and judgment normal.  Nursing note and vitals reviewed.   Recent Results (from the past 2160 hour(s))  POCT Glucose (CBG)     Status: Abnormal   Collection Time:  09/28/16  3:41 PM  Result Value Ref Range   POC Glucose 88 70 - 99 mg/dl  POCT HgB W0J     Status: Normal   Collection Time: 09/28/16  3:50 PM  Result Value Ref Range   Hemoglobin A1C 6.2      Assessment & Plan  1. Type 2 diabetes mellitus without complication, without long-term current use of insulin (HCC) Point of care A1c is 6.2%, well controlled Diabetes.  - POCT HgB A1C - POCT Glucose (CBG) - Urine Microalbumin w/creat. ratio - metFORMIN (GLUCOPHAGE-XR) 500 MG 24 hr tablet; Take 1 tablet (500 mg total) by mouth daily.  Dispense: 90 tablet; Refill: 0  2. Dyslipidemia associated with type 2 diabetes mellitus (HCC)  - COMPLETE METABOLIC PANEL WITH GFR - Lipid panel - atorvastatin (LIPITOR) 10 MG tablet; Take 1 tablet (10 mg total) by mouth daily at 6 PM.  Dispense: 90 tablet; Refill: 0  Orris Perin Asad A. [facility] 09/28/2016 4:01 PM

## 2016-09-30 LAB — COMPLETE METABOLIC PANEL WITH GFR
ALT: 8 U/L (ref 6–29)
AST: 14 U/L (ref 10–35)
Albumin: 3.9 g/dL (ref 3.6–5.1)
Alkaline Phosphatase: 60 U/L (ref 33–130)
BUN: 8 mg/dL (ref 7–25)
CHLORIDE: 107 mmol/L (ref 98–110)
CO2: 29 mmol/L (ref 20–31)
Calcium: 9.2 mg/dL (ref 8.6–10.4)
Creat: 0.84 mg/dL (ref 0.60–0.93)
GFR, EST AFRICAN AMERICAN: 81 mL/min (ref >=60)
GFR, EST NON AFRICAN AMERICAN: 70 mL/min (ref >=60)
GLUCOSE: 96 mg/dL (ref 65–99)
Potassium: 3.9 mmol/L (ref 3.5–5.3)
SODIUM: 144 mmol/L (ref 135–146)
Total Bilirubin: 0.6 mg/dL (ref 0.2–1.2)
Total Protein: 6.7 g/dL (ref 6.1–8.1)

## 2016-09-30 LAB — LIPID PANEL
CHOL/HDL RATIO: 2.1 ratio (ref <5.0)
Cholesterol: 138 mg/dL (ref <200)
HDL: 66 mg/dL (ref >50)
LDL CALC: 57 mg/dL (ref <100)
TRIGLYCERIDES: 74 mg/dL (ref <150)
VLDL: 15 mg/dL (ref <30)

## 2016-10-01 LAB — MICROALBUMIN / CREATININE URINE RATIO
CREATININE, URINE: 139 mg/dL (ref 20–320)
Microalb Creat Ratio: 4 mcg/mg creat (ref <30)
Microalb, Ur: 0.6 mg/dL

## 2016-10-26 DIAGNOSIS — J069 Acute upper respiratory infection, unspecified: Secondary | ICD-10-CM | POA: Diagnosis not present

## 2016-11-02 ENCOUNTER — Ambulatory Visit: Payer: Medicare Other | Admitting: Family Medicine

## 2016-11-04 ENCOUNTER — Ambulatory Visit (INDEPENDENT_AMBULATORY_CARE_PROVIDER_SITE_OTHER): Payer: Medicare Other | Admitting: Family Medicine

## 2016-11-04 ENCOUNTER — Encounter: Payer: Self-pay | Admitting: Family Medicine

## 2016-11-04 VITALS — BP 120/70 | HR 89 | Temp 97.9°F | Resp 16 | Ht 62.0 in | Wt 123.5 lb

## 2016-11-04 DIAGNOSIS — R05 Cough: Secondary | ICD-10-CM | POA: Diagnosis not present

## 2016-11-04 DIAGNOSIS — R058 Other specified cough: Secondary | ICD-10-CM

## 2016-11-04 MED ORDER — AZITHROMYCIN 250 MG PO TABS: ORAL_TABLET | ORAL | 0 refills | Status: DC

## 2016-11-04 MED ORDER — GUAIFENESIN-CODEINE 100-10 MG/5ML PO SYRP: 10.0000 mL | ORAL_SOLUTION | Freq: Three times a day (TID) | ORAL | 0 refills | Status: DC | PRN

## 2016-11-04 NOTE — Progress Notes (Signed)
Name: Kiara Brady   MRN: [id number]    DOB: 1946-03-11   Date:11/04/2016       Progress Note  Subjective  Chief Complaint  Chief Complaint  Patient presents with  . Nasal Congestion    nasal drainage for 4 days    Cough  This is a new problem. The current episode started in the past 7 days. The cough is productive of sputum (initially clear, now changed to light green). Associated symptoms include chest pain (with coughing). Pertinent negatives include no nasal congestion, sore throat or shortness of breath (feels short of breath when she is coughing). She has tried OTC cough suppressant (Robitussin, ) for the symptoms. The treatment provided no relief.     Past Medical History:  Diagnosis Date  . Diabetes mellitus without complication (HCC)   . Hyperlipidemia     Past Surgical History:  Procedure Laterality Date  . VARICOSE VEIN SURGERY      Family History  Problem Relation Age of Onset  . Heart disease Mother   . Alzheimer's disease Mother   . Alcohol abuse Father   . Cancer Maternal Grandmother     breast  . Breast cancer Maternal Grandmother     Social History   Social History  . Marital status: Divorced    Spouse name: N/A  . Number of children: N/A  . Years of education: N/A   Occupational History  . Not on file.   Social History Main Topics  . Smoking status: Former Games developer  . Smokeless tobacco: Never Used  . Alcohol use No  . Drug use: No  . Sexual activity: Not Currently   Other Topics Concern  . Not on file   Social History Narrative  . No narrative on file     Current Outpatient Prescriptions:  .  ACCU-CHEK AVIVA PLUS test strip, , Disp: , Rfl:  .  ACCU-CHEK SOFTCLIX LANCETS lancets, , Disp: , Rfl:  .  atorvastatin (LIPITOR) 10 MG tablet, Take 1 tablet (10 mg total) by mouth daily at 6 PM., Disp: 90 tablet, Rfl: 0 .  Calcium Citrate-Vitamin D (CALCIUM CITRATE + D) 315-250 MG-UNIT TABS, Take by mouth., Disp: , Rfl:  .  fexofenadine  (ALLEGRA) 180 MG tablet, Take 1 tablet (180 mg total) by mouth daily., Disp: 10 tablet, Rfl: 0 .  metFORMIN (GLUCOPHAGE-XR) 500 MG 24 hr tablet, Take 1 tablet (500 mg total) by mouth daily., Disp: 90 tablet, Rfl: 0  Allergies  Allergen Reactions  . Penicillin G Hives and Shortness Of Breath  . Penicillins   . Prednisone Cough     Review of Systems  HENT: Negative for sore throat.   Respiratory: Positive for cough. Negative for shortness of breath (feels short of breath when she is coughing).   Cardiovascular: Positive for chest pain (with coughing).      Objective  Vitals:   11/04/16 1450  BP: 120/70  Pulse: 89  Resp: 16  Temp: 97.9 F (36.6 C)  TempSrc: Oral  SpO2: 96%  Weight: 123 lb 8 oz (56 kg)  Height:  (1.575 m)    Physical Exam  Constitutional: She is oriented to person, place, and time and well-developed, well-nourished, and in no distress.  HENT:  Head: Normocephalic and atraumatic.  Nose: Right sinus exhibits no maxillary sinus tenderness. Left sinus exhibits no maxillary sinus tenderness.  Mouth/Throat: No posterior oropharyngeal erythema.  Cardiovascular: Normal rate, regular rhythm and normal heart sounds.   No murmur heard. Pulmonary/Chest:  Effort normal and breath sounds normal. No respiratory distress. She has no wheezes. She has no rhonchi.  Neurological: She is alert and oriented to person, place, and time.  Nursing note and vitals reviewed.     Assessment & Plan  1. Productive cough Started on azithromycin because of duration of symptoms, prescribed antitussive for relief of congestion and cough - azithromycin (ZITHROMAX) 250 MG tablet; 2 tabs po day 1, then 1 tab po q day x 4 days  Dispense: 6 tablet; Refill: 0 - guaiFENesin-codeine (CHERATUSSIN AC) 100-10 MG/5ML syrup; Take 10 mLs by mouth 3 (three) times daily as needed for cough.  Dispense: 150 mL; Refill: 0   Jimesha Rising Asad A. [facility] 11/04/2016 3:10 PM

## 2016-12-16 ENCOUNTER — Other Ambulatory Visit: Payer: Self-pay | Admitting: Family Medicine

## 2016-12-16 ENCOUNTER — Ambulatory Visit
Admission: RE | Admit: 2016-12-16 | Discharge: 2016-12-16 | Disposition: A | Discharge status: Home / Self Care | Payer: Medicare Other | Source: Ambulatory Visit | Attending: Family Medicine | Admitting: Family Medicine

## 2016-12-16 DIAGNOSIS — Z1231 Encounter for screening mammogram for malignant neoplasm of breast: Secondary | ICD-10-CM | POA: Diagnosis not present

## 2016-12-30 ENCOUNTER — Ambulatory Visit: Payer: Medicare Other | Admitting: Family Medicine

## 2017-01-10 ENCOUNTER — Ambulatory Visit (INDEPENDENT_AMBULATORY_CARE_PROVIDER_SITE_OTHER): Payer: Medicare Other

## 2017-01-10 ENCOUNTER — Encounter: Payer: Self-pay | Admitting: Family Medicine

## 2017-01-10 ENCOUNTER — Ambulatory Visit (INDEPENDENT_AMBULATORY_CARE_PROVIDER_SITE_OTHER): Payer: Medicare Other | Admitting: Family Medicine

## 2017-01-10 VITALS — BP 126/68 | HR 56 | Temp 97.0°F | Resp 17 | Ht 62.0 in | Wt 117.0 lb

## 2017-01-10 DIAGNOSIS — E785 Hyperlipidemia, unspecified: Secondary | ICD-10-CM | POA: Diagnosis not present

## 2017-01-10 DIAGNOSIS — E119 Type 2 diabetes mellitus without complications: Secondary | ICD-10-CM | POA: Diagnosis not present

## 2017-01-10 DIAGNOSIS — E1169 Type 2 diabetes mellitus with other specified complication: Secondary | ICD-10-CM

## 2017-01-10 DIAGNOSIS — L249 Irritant contact dermatitis, unspecified cause: Secondary | ICD-10-CM

## 2017-01-10 DIAGNOSIS — Z0000 Encounter for general adult medical examination without abnormal findings: Secondary | ICD-10-CM | POA: Diagnosis not present

## 2017-01-10 LAB — GLUCOSE, POCT (MANUAL RESULT ENTRY): POC GLUCOSE: 90 mg/dL (ref 70–99)

## 2017-01-10 MED ORDER — MUPIROCIN CALCIUM 2 % EX CREA: 1.0000 "application " | TOPICAL_CREAM | Freq: Two times a day (BID) | CUTANEOUS | 0 refills | Status: DC

## 2017-01-10 MED ORDER — ATORVASTATIN CALCIUM 10 MG PO TABS: 10.0000 mg | ORAL_TABLET | Freq: Every day | ORAL | 0 refills | Status: DC

## 2017-01-10 MED ORDER — METFORMIN HCL ER 500 MG PO TB24
500.0000 mg | ORAL_TABLET | Freq: Every day | ORAL | 0 refills | Status: DC
Start: 2017-01-10 — End: 2017-04-11

## 2017-01-10 NOTE — Progress Notes (Signed)
Subjective:   Kiara Brady is a 71 y.o. female who presents for Medicare Annual (Subsequent) preventive examination.  Review of Systems:  N/A  Cardiac Risk Factors include: advanced age (>7855men, 33>65 women);diabetes mellitus;dyslipidemia     Objective:     Vitals: There were no vitals taken for this visit.  There is no height or weight on file to calculate BMI.   Tobacco History  Smoking Status  . Former Smoker  Smokeless Tobacco  . Never Used     Counseling given: Not Answered   Past Medical History:  Diagnosis Date  . Diabetes mellitus without complication (HCC)   . Hyperlipidemia    Past Surgical History:  Procedure Laterality Date  . VARICOSE VEIN SURGERY     Family History  Problem Relation Age of Onset  . Heart disease Mother   . Alzheimer's disease Mother   . Alcohol abuse Father   . Cancer Maternal Grandmother        breast  . Breast cancer Maternal Grandmother    History  Sexual Activity  . Sexual activity: Not Currently    Outpatient Encounter Prescriptions as of 01/10/2017  Medication Sig  . ACCU-CHEK AVIVA PLUS test strip   . ACCU-CHEK SOFTCLIX LANCETS lancets   . atorvastatin (LIPITOR) 10 MG tablet Take 1 tablet (10 mg total) by mouth daily at 6 PM.  . metFORMIN (GLUCOPHAGE-XR) 500 MG 24 hr tablet Take 1 tablet (500 mg total) by mouth daily.  . mupirocin cream (BACTROBAN) 2 % Apply 1 application topically 2 (two) times daily.  . [DISCONTINUED] Calcium Citrate-Vitamin D (CALCIUM CITRATE + D) 315-250 MG-UNIT TABS Take by mouth.   No facility-administered encounter medications on file as of 01/10/2017.     Activities of Daily Living In your present state of health, do you have any difficulty performing the following activities: 01/10/2017 01/10/2017  Hearing? N Y  Vision? N Y  Difficulty concentrating or making decisions? N N  Walking or climbing stairs? Y Y  Dressing or bathing? N N  Doing errands, shopping? N N  Preparing Food and  eating ? N -  Using the Toilet? N -  In the past six months, have you accidently leaked urine? Y -  Do you have problems with loss of bowel control? N -  Managing your Medications? N -  Managing your Finances? N -  Housekeeping or managing your Housekeeping? N -  Some recent data might be hidden    Patient Care Team: Ellyn HackShah, Syed Asad A, MD as PCP - General (Family Medicine)    Assessment:     Exercise Activities and Dietary recommendations Current Exercise Habits: The patient does not participate in regular exercise at present, Exercise limited by: None identified  Goals    . Exercise 3x per week (30 min per time)          Recommend walking 3 days a week for 20-30 minutes.      Fall Risk Fall Risk  01/10/2017 01/10/2017 09/28/2016 09/16/2016 05/19/2016  Falls in the past year? No No No No No   Depression Screen PHQ 2/9 Scores 01/10/2017 01/10/2017 09/28/2016 09/16/2016  PHQ - 2 Score 1 0 0 0     Cognitive Function     6CIT Screen 01/10/2017  What Year? 0 points  What month? 0 points  What time? 0 points  Count back from 20 0 points  Months in reverse 0 points  Repeat phrase 0 points  Total Score 0  Immunization History  Administered Date(s) Administered  . Pneumococcal Conjugate-13 06/05/2016   Screening Tests Health Maintenance  Topic Date Due  . OPHTHALMOLOGY EXAM  02/07/2017 (Originally 03/08/1956)  . TETANUS/TDAP  02/07/2017 (Originally 03/07/1964)  . Hepatitis C Screening  02/07/2017 (Originally [birth year])  . INFLUENZA VACCINE  03/02/2017  . FOOT EXAM  03/23/2017  . HEMOGLOBIN A1C  03/28/2017  . PNA vac Low Risk Adult (2 of 2 - PPSV23) 06/05/2017  . URINE MICROALBUMIN  09/28/2017  . MAMMOGRAM  12/17/2018  . COLONOSCOPY  06/03/2021  . DEXA SCAN  Completed      Plan:  I have personally reviewed and addressed the Medicare Annual Wellness questionnaire and have noted the following in the patient's chart:  A. Medical and social history B. Use of alcohol,  tobacco or illicit drugs  C. Current medications and supplements D. Functional ability and status E.  Nutritional status F.  Physical activity G. Advance directives H. List of other physicians I.  Hospitalizations, surgeries, and ER visits in previous 12 months J.  Vitals K. Screenings such as hearing and vision if needed, cognitive and depression L. Referrals and appointments - none  In addition, I have reviewed and discussed with patient certain preventive protocols, quality metrics, and best practice recommendations. A written personalized care plan for preventive services as well as general preventive health recommendations were provided to patient.  See attached scanned questionnaire for additional information.   Signed,  Hyacinth Meeker, LPN Nurse Health Advisor   MD Recommendations: Pt needs to schedule eye exam within the next year. Pt declined any vaccines today. Pt to follow up with PCP on 02/08/17.

## 2017-01-10 NOTE — Progress Notes (Signed)
Name: Kiara Brady   MRN: [id number]    DOB: 09/02/1945   Date:01/10/2017       Progress Note  Subjective  Chief Complaint  Chief Complaint  Patient presents with  . Follow-up    3 mo    Diabetes  She presents for her follow-up diabetic visit. She has type 2 diabetes mellitus. Her disease course has been stable. There are no hypoglycemic associated symptoms. Associated symptoms include polyuria. Pertinent negatives for diabetes include no chest pain, no fatigue and no polydipsia. Symptoms are improving. Pertinent negatives for diabetic complications include no CVA, heart disease or impotence. Current diabetic treatment includes oral agent (monotherapy). She is following a generally unhealthy (likes to eat brownies, coffee, etc. Eats a balanced diet.) diet. She participates in exercise three times a week (walks the dog three times daily, mows the lawn). Home blood sugar record trend: does not check her Blood glucose. Eye exam is current.  Hyperlipidemia  This is a chronic problem. The problem is controlled. Recent lipid tests were reviewed and are normal. Pertinent negatives include no chest pain, leg pain, myalgias or shortness of breath. (Feels like her memory is not what it used to be, has read up on side effects of statins and wondering if Simvastatin is causing her memory issues) Current antihyperlipidemic treatment includes statins.   Patient has a rash on her left hand and wrist and the right side of her face along the jaw line, present for over 1 week, rash irritates the skin which leads to scratching, which then leads to blister formation.  She has tried rubbing alcohol which help with the itching.    Past Medical History:  Diagnosis Date  . Diabetes mellitus without complication (HCC)   . Hyperlipidemia     Past Surgical History:  Procedure Laterality Date  . VARICOSE VEIN SURGERY      Family History  Problem Relation Age of Onset  . Heart disease Mother   . Alzheimer's  disease Mother   . Alcohol abuse Father   . Cancer Maternal Grandmother        breast  . Breast cancer Maternal Grandmother     Social History   Social History  . Marital status: Divorced    Spouse name: N/A  . Number of children: N/A  . Years of education: N/A   Occupational History  . Not on file.   Social History Main Topics  . Smoking status: Former Games developermoker  . Smokeless tobacco: Never Used  . Alcohol use No  . Drug use: No  . Sexual activity: Not Currently   Other Topics Concern  . Not on file   Social History Narrative  . No narrative on file     Current Outpatient Prescriptions:  .  ACCU-CHEK AVIVA PLUS test strip, , Disp: , Rfl:  .  ACCU-CHEK SOFTCLIX LANCETS lancets, , Disp: , Rfl:  .  atorvastatin (LIPITOR) 10 MG tablet, Take 1 tablet (10 mg total) by mouth daily at 6 PM., Disp: 90 tablet, Rfl: 0 .  metFORMIN (GLUCOPHAGE-XR) 500 MG 24 hr tablet, Take 1 tablet (500 mg total) by mouth daily., Disp: 90 tablet, Rfl: 0 .  azithromycin (ZITHROMAX) 250 MG tablet, 2 tabs po day 1, then 1 tab po q day x 4 days (Patient not taking: Reported on 01/10/2017), Disp: 6 tablet, Rfl: 0 .  Calcium Citrate-Vitamin D (CALCIUM CITRATE + D) 315-250 MG-UNIT TABS, Take by mouth., Disp: , Rfl:  .  fexofenadine (ALLEGRA) 180 MG  tablet, Take 1 tablet (180 mg total) by mouth daily. (Patient not taking: Reported on 01/10/2017), Disp: 10 tablet, Rfl: 0 .  guaiFENesin-codeine (CHERATUSSIN AC) 100-10 MG/5ML syrup, Take 10 mLs by mouth 3 (three) times daily as needed for cough. (Patient not taking: Reported on 01/10/2017), Disp: 150 mL, Rfl: 0  Allergies  Allergen Reactions  . Penicillin G Hives and Shortness Of Breath  . Penicillins   . Prednisone Cough     Review of Systems  Constitutional: Negative for fatigue.  Respiratory: Negative for shortness of breath.   Cardiovascular: Negative for chest pain.  Genitourinary: Negative for impotence.  Musculoskeletal: Negative for myalgias.   Endo/Heme/Allergies: Negative for polydipsia.     Objective  Vitals:   01/10/17 1020  BP: 126/68  Pulse: (!) 56  Resp: 17  Temp: 97 F (36.1 C)  TempSrc: Oral  SpO2: 97%  Weight: 117 lb (53.1 kg)  Height: 5\' 2"  (1.575 m)    Physical Exam  Constitutional: She is oriented to person, place, and time and well-developed, well-nourished, and in no distress.  HENT:  Head: Normocephalic and atraumatic.  Cardiovascular: Normal rate, regular rhythm, S1 normal and S2 normal.   Murmur heard.  Systolic murmur is present with a grade of 1/6  Pulmonary/Chest: Effort normal and breath sounds normal. She has no wheezes.  Musculoskeletal: She exhibits no edema.  Neurological: She is alert and oriented to person, place, and time.  Skin: Rash noted. Rash is maculopapular and pustular.  papulo-pustular pruritic rash on the left hand radial side and over the base of the thumb Similar rash over the right lateral side of face.   Psychiatric: Mood, memory, affect and judgment normal.  Nursing note and vitals reviewed.     Recent Results (from the past 2160 hour(s))  POCT Glucose (CBG)     Status: Normal   Collection Time: 01/10/17 10:35 AM  Result Value Ref Range   POC Glucose 90 70 - 99 mg/dl     Assessment & Plan  1. Type 2 diabetes mellitus without complication, without long-term current use of insulin (HCC) Point-of-care A1c is 5.7%, well-controlled diabetes, continue on metformin once daily - POCT HgB A1C - POCT Glucose (CBG) - metFORMIN (GLUCOPHAGE-XR) 500 MG 24 hr tablet; Take 1 tablet (500 mg total) by mouth daily.  Dispense: 90 tablet; Refill: 0  2. Irritant hand dermatitis Apply antibiotic cream for symptoms of dermatitis - mupirocin cream (BACTROBAN) 2 %; Apply 1 application topically 2 (two) times daily.  Dispense: 15 g; Refill: 0  3. Dyslipidemia associated with type 2 diabetes mellitus (HCC) FLP at goal, continue on statin - atorvastatin (LIPITOR) 10 MG tablet; Take  1 tablet (10 mg total) by mouth daily at 6 PM.  Dispense: 90 tablet; Refill: 0   Yeny Schmoll Asad A. [facility] 01/10/2017 10:40 AM

## 2017-01-10 NOTE — Patient Instructions (Addendum)
Kiara Brady , Thank you for taking time to come for your Medicare Wellness Visit. I appreciate your ongoing commitment to your health goals. Please review the following plan we discussed and let me know if I can assist you in the future.   Screening recommendations/referrals: Colonoscopy: completed 06/04/11, due 06/2021 Mammogram: completed 12/16/16, due 11/2017 Bone Density: completed 03/05/15 Recommended yearly ophthalmology/optometry visit for glaucoma screening and checkup Recommended yearly dental visit for hygiene and checkup  Vaccinations: Influenza vaccine: due 04/2017 Pneumococcal vaccine: Prevnar given 06/05/16, Pneumovax 23 due 06/05/17 Tdap vaccine: declined Shingles vaccine: declined  Advanced directives: Advance directive discussed with you today. I have provided a copy for you to complete at home and have notarized. Once this is complete please bring a copy in to our office so we can scan it into your chart.  Conditions/risks identified: Recommend walking 3 days a week for 20-30 minutes.  Next appointment: 02/08/17 @ 11:00 AM   Preventive Care 65 Years and Older, Female Preventive care refers to lifestyle choices and visits with your health care provider that can promote health and wellness. What does preventive care include?  A yearly physical exam. This is also called an annual well check.  Dental exams once or twice a year.  Routine eye exams. Ask your health care provider how often you should have your eyes checked.  Personal lifestyle choices, including:  Daily care of your teeth and gums.  Regular physical activity.  Eating a healthy diet.  Avoiding tobacco and drug use.  Limiting alcohol use.  Practicing safe sex.  Taking low-dose aspirin every day.  Taking vitamin and mineral supplements as recommended by your health care provider. What happens during an annual well check? The services and screenings done by your health care provider during your  annual well check will depend on your age, overall health, lifestyle risk factors, and family history of disease. Counseling  Your health care provider may ask you questions about your:  Alcohol use.  Tobacco use.  Drug use.  Emotional well-being.  Home and relationship well-being.  Sexual activity.  Eating habits.  History of falls.  Memory and ability to understand (cognition).  Work and work Astronomerenvironment.  Reproductive health. Screening  You may have the following tests or measurements:  Height, weight, and BMI.  Blood pressure.  Lipid and cholesterol levels. These may be checked every 5 years, or more frequently if you are over [id number] years old.  Skin check.  Lung cancer screening. You may have this screening every year starting at age 72 if you have a 30-pack-year history of smoking and currently smoke or have quit within the past 15 years.  Fecal occult blood test (FOBT) of the stool. You may have this test every year starting at age 72.  Flexible sigmoidoscopy or colonoscopy. You may have a sigmoidoscopy every 5 years or a colonoscopy every 10 years starting at age 72.  Hepatitis C blood test.  Hepatitis B blood test.  Sexually transmitted disease (STD) testing.  Diabetes screening. This is done by checking your blood sugar (glucose) after you have not eaten for a while (fasting). You may have this done every 1-3 years.  Bone density scan. This is done to screen for osteoporosis. You may have this done starting at age 72.  Mammogram. This may be done every 1-2 years. Talk to your health care provider about how often you should have regular mammograms. Talk with your health care provider about your test results, treatment options, and  if necessary, the need for more tests. Vaccines  Your health care provider may recommend certain vaccines, such as:  Influenza vaccine. This is recommended every year.  Tetanus, diphtheria, and acellular pertussis (Tdap, Td)  vaccine. You may need a Td booster every 10 years.  Zoster vaccine. You may need this after age 32.  Pneumococcal 13-valent conjugate (PCV13) vaccine. One dose is recommended after age 78.  Pneumococcal polysaccharide (PPSV23) vaccine. One dose is recommended after age 1. Talk to your health care provider about which screenings and vaccines you need and how often you need them. This information is not intended to replace advice given to you by your health care provider. Make sure you discuss any questions you have with your health care provider. Document Released: 08/15/2015 Document Revised: 04/07/2016 Document Reviewed: 05/20/2015 Elsevier Interactive Patient Education  2017 [place] Prevention in the Home Falls can cause injuries. They can happen to people of all ages. There are many things you can do to make your home safe and to help prevent falls. What can I do on the outside of my home?  Regularly fix the edges of walkways and driveways and fix any cracks.  Remove anything that might make you trip as you walk through a door, such as a raised step or threshold.  Trim any bushes or trees on the path to your home.  Use bright outdoor lighting.  Clear any walking paths of anything that might make someone trip, such as rocks or tools.  Regularly check to see if handrails are loose or broken. Make sure that both sides of any steps have handrails.  Any raised decks and porches should have guardrails on the edges.  Have any leaves, snow, or ice cleared regularly.  Use sand or salt on walking paths during winter.  Clean up any spills in your garage right away. This includes oil or grease spills. What can I do in the bathroom?  Use night lights.  Install grab bars by the toilet and in the tub and shower. Do not use towel bars as grab bars.  Use non-skid mats or decals in the tub or shower.  If you need to sit down in the shower, use a plastic, non-slip  stool.  Keep the floor dry. Clean up any water that spills on the floor as soon as it happens.  Remove soap buildup in the tub or shower regularly.  Attach bath mats securely with double-sided non-slip rug tape.  Do not have throw rugs and other things on the floor that can make you trip. What can I do in the bedroom?  Use night lights.  Make sure that you have a light by your bed that is easy to reach.  Do not use any sheets or blankets that are too big for your bed. They should not hang down onto the floor.  Have a firm chair that has side arms. You can use this for support while you get dressed.  Do not have throw rugs and other things on the floor that can make you trip. What can I do in the kitchen?  Clean up any spills right away.  Avoid walking on wet floors.  Keep items that you use a lot in easy-to-reach places.  If you need to reach something above you, use a strong step stool that has a grab bar.  Keep electrical cords out of the way.  Do not use floor polish or wax that makes floors slippery. If you  must use wax, use non-skid floor wax.  Do not have throw rugs and other things on the floor that can make you trip. What can I do with my stairs?  Do not leave any items on the stairs.  Make sure that there are handrails on both sides of the stairs and use them. Fix handrails that are broken or loose. Make sure that handrails are as long as the stairways.  Check any carpeting to make sure that it is firmly attached to the stairs. Fix any carpet that is loose or worn.  Avoid having throw rugs at the top or bottom of the stairs. If you do have throw rugs, attach them to the floor with carpet tape.  Make sure that you have a light switch at the top of the stairs and the bottom of the stairs. If you do not have them, ask someone to add them for you. What else can I do to help prevent falls?  Wear shoes that:  Do not have high heels.  Have rubber bottoms.  Are  comfortable and fit you well.  Are closed at the toe. Do not wear sandals.  If you use a stepladder:  Make sure that it is fully opened. Do not climb a closed stepladder.  Make sure that both sides of the stepladder are locked into place.  Ask someone to hold it for you, if possible.  Clearly mark and make sure that you can see:  Any grab bars or handrails.  First and last steps.  Where the edge of each step is.  Use tools that help you move around (mobility aids) if they are needed. These include:  Canes.  Walkers.  Scooters.  Crutches.  Turn on the lights when you go into a dark area. Replace any light bulbs as soon as they burn out.  Set up your furniture so you have a clear path. Avoid moving your furniture around.  If any of your floors are uneven, fix them.  If there are any pets around you, be aware of where they are.  Review your medicines with your doctor. Some medicines can make you feel dizzy. This can increase your chance of falling. Ask your doctor what other things that you can do to help prevent falls. This information is not intended to replace advice given to you by your health care provider. Make sure you discuss any questions you have with your health care provider. Document Released: 05/15/2009 Document Revised: 12/25/2015 Document Reviewed: 08/23/2014 Elsevier Interactive Patient Education  2017 [employer].

## 2017-01-11 LAB — POCT GLYCOSYLATED HEMOGLOBIN (HGB A1C): Hemoglobin A1C: 5.7

## 2017-02-08 ENCOUNTER — Encounter: Payer: Medicare Other | Admitting: Family Medicine

## 2017-03-01 ENCOUNTER — Encounter: Payer: Medicare Other | Admitting: Family Medicine

## 2017-03-17 ENCOUNTER — Ambulatory Visit (INDEPENDENT_AMBULATORY_CARE_PROVIDER_SITE_OTHER): Payer: Medicare Other | Admitting: Family Medicine

## 2017-03-17 ENCOUNTER — Encounter: Payer: Self-pay | Admitting: Family Medicine

## 2017-03-17 NOTE — Progress Notes (Signed)
errThis encounter was created in error - please disregard.Name: Kiara Brady   MRN: [id number]    DOB: 05/16/1946   Date:03/17/2017       Progress Note  Subjective  Chief Complaint  Chief Complaint  Patient presents with  . Annual Exam    CPE    HPI  Patient presents for complaints of bilateral leg discomfort  Past Medical History:  Diagnosis Date  . Diabetes mellitus without complication (HCC)   . Hyperlipidemia     Past Surgical History:  Procedure Laterality Date  . VARICOSE VEIN SURGERY      Family History  Problem Relation Age of Onset  . Heart disease Mother   . Alzheimer's disease Mother   . Alcohol abuse Father   . Cancer Maternal Grandmother        breast  . Breast cancer Maternal Grandmother     Social History   Social History  . Marital status: Divorced    Spouse name: N/A  . Number of children: N/A  . Years of education: N/A   Occupational History  . Not on file.   Social History Main Topics  . Smoking status: Former Games developermoker  . Smokeless tobacco: Never Used  . Alcohol use No  . Drug use: No  . Sexual activity: Not Currently   Other Topics Concern  . Not on file   Social History Narrative  . No narrative on file     Current Outpatient Prescriptions:  .  ACCU-CHEK AVIVA PLUS test strip, , Disp: , Rfl:  .  ACCU-CHEK SOFTCLIX LANCETS lancets, , Disp: , Rfl:  .  atorvastatin (LIPITOR) 10 MG tablet, Take 1 tablet (10 mg total) by mouth daily at 6 PM., Disp: 90 tablet, Rfl: 0 .  metFORMIN (GLUCOPHAGE-XR) 500 MG 24 hr tablet, Take 1 tablet (500 mg total) by mouth daily., Disp: 90 tablet, Rfl: 0 .  mupirocin cream (BACTROBAN) 2 %, Apply 1 application topically 2 (two) times daily., Disp: 15 g, Rfl: 0  Allergies  Allergen Reactions  . Penicillin G Hives and Shortness Of Breath  . Penicillins   . Prednisone Cough  . Xanax [Alprazolam]     hyperactivity     ROS    Objective  Vitals:   03/17/17 1400  BP: 124/68  Pulse: 60  Resp:  16  Temp: 98.5 F (36.9 C)  TempSrc: Oral  SpO2: 97%  Weight: 122 lb 12.8 oz (55.7 kg)  Height: 5\' 2"  (1.575 m)    Physical Exam    Recent Results (from the past 2160 hour(s))  POCT Glucose (CBG)     Status: Normal   Collection Time: 01/10/17 10:35 AM  Result Value Ref Range   POC Glucose 90 70 - 99 mg/dl  POCT HgB [id number]     Status: None   Collection Time: 01/11/17  9:06 AM  Result Value Ref Range   Hemoglobin A1C 5.7      Assessment & Plan  There are no diagnoses linked to this encounter.  Kiara Brady Asad A. Faylene [facility]zSh[facility] 03/17/2017 2:05 PM

## 2017-04-11 ENCOUNTER — Ambulatory Visit (INDEPENDENT_AMBULATORY_CARE_PROVIDER_SITE_OTHER): Payer: Medicare Other | Admitting: Family Medicine

## 2017-04-11 ENCOUNTER — Encounter: Payer: Self-pay | Admitting: Family Medicine

## 2017-04-11 VITALS — BP 125/66 | HR 72 | Temp 97.5°F | Resp 16 | Ht 62.0 in | Wt 122.6 lb

## 2017-04-11 DIAGNOSIS — E1169 Type 2 diabetes mellitus with other specified complication: Secondary | ICD-10-CM | POA: Diagnosis not present

## 2017-04-11 DIAGNOSIS — E785 Hyperlipidemia, unspecified: Secondary | ICD-10-CM

## 2017-04-11 DIAGNOSIS — L989 Disorder of the skin and subcutaneous tissue, unspecified: Secondary | ICD-10-CM

## 2017-04-11 DIAGNOSIS — E119 Type 2 diabetes mellitus without complications: Secondary | ICD-10-CM | POA: Diagnosis not present

## 2017-04-11 LAB — GLUCOSE, POCT (MANUAL RESULT ENTRY): POC Glucose: 80 mg/dl (ref 70–99)

## 2017-04-11 LAB — POCT GLYCOSYLATED HEMOGLOBIN (HGB A1C): HEMOGLOBIN A1C: 6

## 2017-04-11 MED ORDER — METFORMIN HCL ER 500 MG PO TB24: 500.0000 mg | ORAL_TABLET | Freq: Every day | ORAL | 0 refills | Status: DC

## 2017-04-11 MED ORDER — ATORVASTATIN CALCIUM 10 MG PO TABS: 10.0000 mg | ORAL_TABLET | Freq: Every day | ORAL | 0 refills | Status: DC

## 2017-04-11 NOTE — Progress Notes (Signed)
Name: Kiara Brady   MRN: [id number]    DOB: 03/17/1946   Date:04/11/2017       Progress Note  Subjective  Chief Complaint  Chief Complaint  Patient presents with  . Follow-up    3 mo  . Medication Refill  . Diabetes  . Hyperlipidemia    Diabetes  She presents for her follow-up diabetic visit. She has type 2 diabetes mellitus. Her disease course has been stable. There are no hypoglycemic associated symptoms. Associated symptoms include polyuria. Pertinent negatives for diabetes include no chest pain, no fatigue and no polydipsia. Symptoms are improving. Pertinent negatives for diabetic complications include no CVA, heart disease or impotence. Current diabetic treatment includes oral agent (monotherapy). She is following a generally unhealthy (likes to eat brownies, coffee, etc. Eats a balanced diet.) diet. She participates in exercise three times a week (mows the lawn). Home blood sugar record trend: does not check her Blood glucose. Eye exam is current.  Hyperlipidemia  This is a chronic problem. The problem is controlled. Recent lipid tests were reviewed and are normal. Pertinent negatives include no chest pain, leg pain, myalgias or shortness of breath. (Feels like her memory is not what it used to be, has read up on side effects of statins and wondering if Simvastatin is causing her memory issues) Current antihyperlipidemic treatment includes statins.  Patient is worried about a lesion on her left upper chest, noticed it several months ago, no change in size, color or character, no fevers, chills, fatigue etc.  Past Medical History:  Diagnosis Date  . Diabetes mellitus without complication (HCC)   . Hyperlipidemia     Past Surgical History:  Procedure Laterality Date  . VARICOSE VEIN SURGERY      Family History  Problem Relation Age of Onset  . Heart disease Mother   . Alzheimer's disease Mother   . Alcohol abuse Father   . Cancer Maternal Grandmother        breast  .  Breast cancer Maternal Grandmother     Social History   Social History  . Marital status: Divorced    Spouse name: N/A  . Number of children: N/A  . Years of education: N/A   Occupational History  . Not on file.   Social History Main Topics  . Smoking status: Former Games developermoker  . Smokeless tobacco: Never Used  . Alcohol use No  . Drug use: No  . Sexual activity: Not Currently   Other Topics Concern  . Not on file   Social History Narrative  . No narrative on file     Current Outpatient Prescriptions:  .  ACCU-CHEK AVIVA PLUS test strip, , Disp: , Rfl:  .  ACCU-CHEK SOFTCLIX LANCETS lancets, , Disp: , Rfl:  .  atorvastatin (LIPITOR) 10 MG tablet, Take 1 tablet (10 mg total) by mouth daily at 6 PM., Disp: 90 tablet, Rfl: 0 .  metFORMIN (GLUCOPHAGE-XR) 500 MG 24 hr tablet, Take 1 tablet (500 mg total) by mouth daily., Disp: 90 tablet, Rfl: 0 .  mupirocin cream (BACTROBAN) 2 %, Apply 1 application topically 2 (two) times daily., Disp: 15 g, Rfl: 0  Allergies  Allergen Reactions  . Penicillin G Hives and Shortness Of Breath  . Penicillins   . Prednisone Cough  . Xanax [Alprazolam]     hyperactivity     Review of Systems  Constitutional: Negative for fatigue.  Respiratory: Negative for shortness of breath.   Cardiovascular: Negative for chest pain.  Genitourinary:  Negative for impotence.  Musculoskeletal: Negative for myalgias.  Endo/Heme/Allergies: Negative for polydipsia.     Objective  Vitals:   04/11/17 1312  BP: 125/66  Pulse: 72  Resp: 16  Temp: (!) 97.5 F (36.4 C)  TempSrc: Oral  SpO2: 99%  Weight: 122 lb 9.6 oz (55.6 kg)  Height:  (1.575 m)    Physical Exam  Constitutional: She is oriented to person, place, and time and well-developed, well-nourished, and in no distress.  HENT:  Head: Normocephalic and atraumatic.  Cardiovascular: Normal rate, regular rhythm and normal heart sounds.   No murmur heard. Pulmonary/Chest: Effort normal and  breath sounds normal. She has no wheezes.  Neurological: She is alert and oriented to person, place, and time.  Skin: Lesion noted.     Round, well defined, non erythematous lesion on the anterior left upper chest.  Psychiatric: Mood, memory, affect and judgment normal.  Nursing note and vitals reviewed.      Recent Results (from the past 2160 hour(s))  POCT Glucose (CBG)     Status: Normal   Collection Time: 04/11/17  1:15 PM  Result Value Ref Range   POC Glucose 80 70 - 99 mg/dl  POCT HgB [id number]     Status: Normal   Collection Time: 04/11/17  1:17 PM  Result Value Ref Range   Hemoglobin A1C 6.0      Assessment & Plan  1. Type 2 diabetes mellitus without complication, without long-term current use of insulin (HCC)   A1c 6.0%, well-controlled diabetes, no change in therapy - POCT HgB A1C - POCT Glucose (CBG) - metFORMIN (GLUCOPHAGE-XR) 500 MG 24 hr tablet; Take 1 tablet (500 mg total) by mouth daily.  Dispense: 90 tablet; Refill: 0  2. Dyslipidemia associated with type 2 diabetes mellitus (HCC)  - atorvastatin (LIPITOR) 10 MG tablet; Take 1 tablet (10 mg total) by mouth daily at 6 PM.  Dispense: 90 tablet; Refill: 0 - Lipid panel - COMPLETE METABOLIC PANEL WITH GFR  3. Skin lesion of chest wall Patient does not wish to be referred to dermatology, no apparent change in the lesion, will continue to monitor and a future referral may be provided   [employer] A. [facility] 04/11/2017 1:30 PM

## 2017-04-25 DIAGNOSIS — E1169 Type 2 diabetes mellitus with other specified complication: Secondary | ICD-10-CM | POA: Diagnosis not present

## 2017-04-25 DIAGNOSIS — E785 Hyperlipidemia, unspecified: Secondary | ICD-10-CM | POA: Diagnosis not present

## 2017-04-25 LAB — COMPLETE METABOLIC PANEL WITH GFR
AG Ratio: 1.8 (calc) (ref 1.0–2.5)
ALT: 8 U/L (ref 6–29)
AST: 15 U/L (ref 10–35)
Albumin: 4.4 g/dL (ref 3.6–5.1)
Alkaline phosphatase (APISO): 61 U/L (ref 33–130)
BUN: 11 mg/dL (ref 7–25)
CO2: 30 mmol/L (ref 20–32)
Calcium: 9.3 mg/dL (ref 8.6–10.4)
Chloride: 106 mmol/L (ref 98–110)
Creat: 0.81 mg/dL (ref 0.60–0.93)
GFR, EST AFRICAN AMERICAN: 84 mL/min/{1.73_m2} (ref >=60)
GFR, EST NON AFRICAN AMERICAN: 73 mL/min/{1.73_m2} (ref >=60)
GLUCOSE: 105 mg/dL — AB (ref 65–99)
Globulin: 2.4 g/dL (calc) (ref 1.9–3.7)
Potassium: 3.8 mmol/L (ref 3.5–5.3)
Sodium: 141 mmol/L (ref 135–146)
Total Bilirubin: 0.8 mg/dL (ref 0.2–1.2)
Total Protein: 6.8 g/dL (ref 6.1–8.1)

## 2017-04-25 LAB — LIPID PANEL
CHOL/HDL RATIO: 2.2 (calc) (ref <5.0)
Cholesterol: 152 mg/dL (ref <200)
HDL: 68 mg/dL (ref >50)
LDL Cholesterol (Calc): 66 mg/dL (calc)
NON-HDL CHOLESTEROL (CALC): 84 mg/dL (ref <130)
Triglycerides: 97 mg/dL (ref <150)

## 2017-04-26 ENCOUNTER — Ambulatory Visit: Payer: Medicare Other | Admitting: Family Medicine

## 2017-05-17 ENCOUNTER — Encounter: Payer: Self-pay | Admitting: Family Medicine

## 2017-05-17 ENCOUNTER — Ambulatory Visit (INDEPENDENT_AMBULATORY_CARE_PROVIDER_SITE_OTHER): Payer: Medicare Other | Admitting: Family Medicine

## 2017-05-17 VITALS — BP 128/76 | HR 72 | Temp 98.6°F | Resp 14 | Ht 62.0 in | Wt 123.0 lb

## 2017-05-17 DIAGNOSIS — M1712 Unilateral primary osteoarthritis, left knee: Secondary | ICD-10-CM

## 2017-05-17 NOTE — Progress Notes (Signed)
Name: Kiara Brady   MRN: [id number]    DOB: 1945/11/03   Date:05/17/2017       Progress Note  Subjective  Chief Complaint  Chief Complaint  Patient presents with  . Pain    joint pain all over.     HPI  Pt. Presents for a referral to [facility] for follow up of osteoarthritis of right knee, she has seen Dr. Katrinka Blazing for sometime, he is now retired, a new doctor is going to see her and the staff advised her to obtain a referral to Orthopedics.  She has long-standing history of arthritis in left knee and now the right knee is starting to bother her as well.    Past Medical History:  Diagnosis Date  . Diabetes mellitus without complication (HCC)   . Hyperlipidemia     Past Surgical History:  Procedure Laterality Date  . VARICOSE VEIN SURGERY      Family History  Problem Relation Age of Onset  . Heart disease Mother   . Alzheimer's disease Mother   . Alcohol abuse Father   . Cancer Maternal Grandmother        breast  . Breast cancer Maternal Grandmother     Social History   Social History  . Marital status: Divorced    Spouse name: N/A  . Number of children: N/A  . Years of education: N/A   Occupational History  . Not on file.   Social History Main Topics  . Smoking status: Former Games developer  . Smokeless tobacco: Never Used  . Alcohol use No  . Drug use: No  . Sexual activity: Not Currently   Other Topics Concern  . Not on file   Social History Narrative  . No narrative on file     Current Outpatient Prescriptions:  .  ACCU-CHEK AVIVA PLUS test strip, , Disp: , Rfl:  .  ACCU-CHEK SOFTCLIX LANCETS lancets, , Disp: , Rfl:  .  atorvastatin (LIPITOR) 10 MG tablet, Take 1 tablet (10 mg total) by mouth daily at 6 PM., Disp: 90 tablet, Rfl: 0 .  ibuprofen (ADVIL,MOTRIN) 200 MG tablet, Take 200 mg by mouth every 6 (six) hours as needed., Disp: , Rfl:  .  metFORMIN (GLUCOPHAGE-XR) 500 MG 24 hr tablet, Take 1 tablet (500 mg total) by mouth daily., Disp: 90  tablet, Rfl: 0 .  mupirocin cream (BACTROBAN) 2 %, Apply 1 application topically 2 (two) times daily. (Patient not taking: Reported on 05/17/2017), Disp: 15 g, Rfl: 0  Allergies  Allergen Reactions  . Oxycodone Other (See Comments)  . Penicillin G Hives and Shortness Of Breath  . Penicillins   . Prednisone Cough  . Xanax [Alprazolam]     hyperactivity     ROS  Please see history of present illness for complete discussion of ROS  Objective  Vitals:   05/17/17 1602  BP: 128/76  Pulse: 72  Resp: 14  Temp: 98.6 F (37 C)  TempSrc: Oral  SpO2: 95%  Weight: 123 lb (55.8 kg)  Height:  (1.575 m)    Physical Exam  Constitutional: She is oriented to person, place, and time and well-developed, well-nourished, and in no distress.  HENT:  Head: Normocephalic and atraumatic.  Musculoskeletal:       Right knee: She exhibits no swelling and no erythema. No tenderness found. No medial joint line tenderness noted.       Left knee: She exhibits decreased range of motion and deformity. She exhibits no erythema.  Tenderness found. Medial joint line tenderness noted.  Neurological: She is alert and oriented to person, place, and time.  Psychiatric: Mood, memory, affect and judgment normal.  Nursing note and vitals reviewed.      Recent Results (from the past 2160 hour(s))  POCT Glucose (CBG)     Status: Normal   Collection Time: 04/11/17  1:15 PM  Result Value Ref Range   POC Glucose 80 70 - 99 mg/dl  POCT HgB [id number]     Status: Normal   Collection Time: 04/11/17  1:17 PM  Result Value Ref Range   Hemoglobin A1C 6.0   Lipid panel     Status: None   Collection Time: 04/25/17  9:30 AM  Result Value Ref Range   Cholesterol 152 <200 mg/dL   HDL 68 >11 mg/dL   Triglycerides 97 <914 mg/dL   LDL Cholesterol (Calc) 66 mg/dL (calc)    Comment: Reference range: <100 . Desirable range <100 mg/dL for primary prevention;   <70 mg/dL for patients with CHD or diabetic patients  with >  or = 2 CHD risk factors. Marland Kitchen LDL-C is now calculated using the Martin-Hopkins  calculation, which is a validated novel method providing  better accuracy than the Friedewald equation in the  estimation of LDL-C.  Horald Pollen et al. Lenox Ahr. 7829;562(13): 2061-2068  (http://education.[employer].com/faq/FAQ164)    Total CHOL/HDL Ratio 2.2 <5.0 (calc)   Non-HDL Cholesterol (Calc) 84 <086 mg/dL (calc)    Comment: For patients with diabetes plus 1 major ASCVD risk  factor, treating to a non-HDL-C goal of <100 mg/dL  (LDL-C of <57 mg/dL) is considered a therapeutic  option.   COMPLETE METABOLIC PANEL WITH GFR     Status: Abnormal   Collection Time: 04/25/17  9:30 AM  Result Value Ref Range   Glucose, Bld 105 (H) 65 - 99 mg/dL    Comment: .            Fasting reference interval . For someone without known diabetes, a glucose value between 100 and 125 mg/dL is consistent with prediabetes and should be confirmed with a follow-up test. .    BUN 11 7 - 25 mg/dL   Creat 8.46 9.62 - 9.52 mg/dL    Comment: For patients >12 years of age, the reference limit for Creatinine is approximately 13% higher for people identified as African-American. .    GFR, Est Non African American 73 > OR = 60 mL/min/1.46m2   GFR, Est African American 84 > OR = 60 mL/min/1.13m2   BUN/Creatinine Ratio NOT APPLICABLE 6 - 22 (calc)   Sodium 141 135 - 146 mmol/L   Potassium 3.8 3.5 - 5.3 mmol/L   Chloride 106 98 - 110 mmol/L   CO2 30 20 - 32 mmol/L   Calcium 9.3 8.6 - 10.4 mg/dL   Total Protein 6.8 6.1 - 8.1 g/dL   Albumin 4.4 3.6 - 5.1 g/dL   Globulin 2.4 1.9 - 3.7 g/dL (calc)   AG Ratio 1.8 1.0 - 2.5 (calc)   Total Bilirubin 0.8 0.2 - 1.2 mg/dL   Alkaline phosphatase (APISO) 61 33 - 130 U/L   AST 15 10 - 35 U/L   ALT 8 6 - 29 U/L     Assessment & Plan  1. Primary osteoarthritis of left knee Referral to Dr. Samuel Germany office for management - Ambulatory referral to Orthopedic Surgery   Brent Noto Asad  A. [facility] 05/17/2017 4:26 PM

## 2017-05-20 ENCOUNTER — Other Ambulatory Visit: Payer: Self-pay | Admitting: Orthopedic Surgery

## 2017-05-20 DIAGNOSIS — M1712 Unilateral primary osteoarthritis, left knee: Secondary | ICD-10-CM | POA: Diagnosis not present

## 2017-05-23 ENCOUNTER — Encounter: Payer: Self-pay | Admitting: Family Medicine

## 2017-05-25 ENCOUNTER — Encounter: Payer: Self-pay | Admitting: Family Medicine

## 2017-05-25 ENCOUNTER — Ambulatory Visit (INDEPENDENT_AMBULATORY_CARE_PROVIDER_SITE_OTHER): Payer: Medicare Other | Admitting: Family Medicine

## 2017-05-25 VITALS — BP 120/74 | HR 81 | Temp 98.6°F | Resp 16 | Wt 119.7 lb

## 2017-05-25 DIAGNOSIS — K529 Noninfective gastroenteritis and colitis, unspecified: Secondary | ICD-10-CM

## 2017-05-25 DIAGNOSIS — J029 Acute pharyngitis, unspecified: Secondary | ICD-10-CM | POA: Diagnosis not present

## 2017-05-25 LAB — POCT RAPID STREP A (OFFICE): Rapid Strep A Screen: NEGATIVE

## 2017-05-25 NOTE — Progress Notes (Signed)
Name: Kiara Brady   MRN: [name]    DOB: 03/16/1946   Date:05/25/2017       Progress Note  Subjective  Chief Complaint  Chief Complaint  Patient presents with  . Sore Throat    Still expereicning the sore throat  . Fever    PT states last night was very warm   . Diarrhea    Last time was sunday. Not having it anymore only constipation  . Emesis    LAst time was sunday     Emesis   This is a new problem. The current episode started in the past 7 days (5 days ago, she had 1 episode of vomiting 3 nights ago, was black colored material). The problem has been resolved. The emesis has an appearance of stomach contents. There has been no fever. Associated symptoms include abdominal pain, coughing and diarrhea (watery diarrhea, brown greenish colored stool). Pertinent negatives include no chills or fever. Risk factors include suspect food intake. She has tried increased fluids and sleep for the symptoms.  Sore Throat   This is a recurrent problem. The current episode started yesterday. The problem has been unchanged. There has been no fever. Associated symptoms include abdominal pain, congestion, coughing, diarrhea (watery diarrhea, brown greenish colored stool) and vomiting. Pertinent negatives include no ear discharge.     Past Medical History:  Diagnosis Date  . Diabetes mellitus without complication (HCC)   . Hyperlipidemia     Past Surgical History:  Procedure Laterality Date  . VARICOSE VEIN SURGERY      Family History  Problem Relation Age of Onset  . Heart disease Mother   . Alzheimer's disease Mother   . Alcohol abuse Father   . Cancer Maternal Grandmother        breast  . Breast cancer Maternal Grandmother     Social History   Social History  . Marital status: Divorced    Spouse name: N/A  . Number of children: N/A  . Years of education: N/A   Occupational History  . Not on file.   Social History Main Topics  . Smoking status: Former Games developermoker  .  Smokeless tobacco: Never Used  . Alcohol use No  . Drug use: No  . Sexual activity: Not Currently   Other Topics Concern  . Not on file   Social History Narrative  . No narrative on file     Current Outpatient Prescriptions:  .  ACCU-CHEK AVIVA PLUS test strip, , Disp: , Rfl:  .  ACCU-CHEK SOFTCLIX LANCETS lancets, , Disp: , Rfl:  .  atorvastatin (LIPITOR) 10 MG tablet, Take 1 tablet (10 mg total) by mouth daily at 6 PM., Disp: 90 tablet, Rfl: 0 .  ibuprofen (ADVIL,MOTRIN) 200 MG tablet, Take 200 mg by mouth every 6 (six) hours as needed., Disp: , Rfl:  .  metFORMIN (GLUCOPHAGE-XR) 500 MG 24 hr tablet, Take 1 tablet (500 mg total) by mouth daily., Disp: 90 tablet, Rfl: 0 .  mupirocin cream (BACTROBAN) 2 %, Apply 1 application topically 2 (two) times daily. (Patient not taking: Reported on 05/17/2017), Disp: 15 g, Rfl: 0  Allergies  Allergen Reactions  . Oxycodone Other (See Comments)  . Penicillin G Hives and Shortness Of Breath  . Penicillins   . Prednisone Cough  . Xanax [Alprazolam]     hyperactivity     Review of Systems  Constitutional: Negative for chills and fever.  HENT: Positive for congestion. Negative for ear discharge.  Respiratory: Positive for cough.   Gastrointestinal: Positive for abdominal pain, diarrhea (watery diarrhea, brown greenish colored stool) and vomiting.    Objective  Vitals:   05/25/17 0951  BP: 120/74  Pulse: 81  Resp: 16  Temp: 98.6 F (37 C)  TempSrc: Oral  SpO2: 97%  Weight: 119 lb 11.2 oz (54.3 kg)    Physical Exam  Constitutional: She is oriented to person, place, and time and well-developed, well-nourished, and in no distress.  HENT:  Head: Normocephalic and atraumatic.  Mouth/Throat: Oropharynx is clear and moist. No oropharyngeal exudate.  Cardiovascular: Normal rate, regular rhythm and normal heart sounds.   No murmur heard. Pulmonary/Chest: Effort normal and breath sounds normal. She has no wheezes.  Abdominal:  Soft. Bowel sounds are normal. There is no tenderness.  Neurological: She is alert and oriented to person, place, and time.  Psychiatric: Mood, memory, affect and judgment normal.  Nursing note and vitals reviewed.      Recent Results (from the past 2160 hour(s))  POCT Glucose (CBG)     Status: Normal   Collection Time: 04/11/17  1:15 PM  Result Value Ref Range   POC Glucose 80 70 - 99 mg/dl  POCT HgB [id number]     Status: Normal   Collection Time: 04/11/17  1:17 PM  Result Value Ref Range   Hemoglobin A1C 6.0   Lipid panel     Status: None   Collection Time: 04/25/17  9:30 AM  Result Value Ref Range   Cholesterol 152 <200 mg/dL   HDL 68 >09 mg/dL   Triglycerides 97 <604 mg/dL   LDL Cholesterol (Calc) 66 mg/dL (calc)    Comment: Reference range: <100 . Desirable range <100 mg/dL for primary prevention;   <70 mg/dL for patients with CHD or diabetic patients  with > or = 2 CHD risk factors. Marland Kitchen LDL-C is now calculated using the Martin-Hopkins  calculation, which is a validated novel method providing  better accuracy than the Friedewald equation in the  estimation of LDL-C.  Horald Pollen et al. Lenox Ahr. 5409;811(91): 2061-2068  (http://education.[facility].com/faq/FAQ164)    Total CHOL/HDL Ratio 2.2 <5.0 (calc)   Non-HDL Cholesterol (Calc) 84 <478 mg/dL (calc)    Comment: For patients with diabetes plus 1 major ASCVD risk  factor, treating to a non-HDL-C goal of <100 mg/dL  (LDL-C of <29 mg/dL) is considered a therapeutic  option.   COMPLETE METABOLIC PANEL WITH GFR     Status: Abnormal   Collection Time: 04/25/17  9:30 AM  Result Value Ref Range   Glucose, Bld 105 (H) 65 - 99 mg/dL    Comment: .            Fasting reference interval . For someone without known diabetes, a glucose value between 100 and 125 mg/dL is consistent with prediabetes and should be confirmed with a follow-up test. .    BUN 11 7 - 25 mg/dL   Creat 5.62 1.30 - 8.65 mg/dL    Comment: For patients  >66 years of age, the reference limit for Creatinine is approximately 13% higher for people identified as African-American. .    GFR, Est Non African American 73 > OR = 60 mL/min/1.52m2   GFR, Est African American 84 > OR = 60 mL/min/1.61m2   BUN/Creatinine Ratio NOT APPLICABLE 6 - 22 (calc)   Sodium 141 135 - 146 mmol/L   Potassium 3.8 3.5 - 5.3 mmol/L   Chloride 106 98 - 110 mmol/L   CO2 30 20 -  32 mmol/L   Calcium 9.3 8.6 - 10.4 mg/dL   Total Protein 6.8 6.1 - 8.1 g/dL   Albumin 4.4 3.6 - 5.1 g/dL   Globulin 2.4 1.9 - 3.7 g/dL (calc)   AG Ratio 1.8 1.0 - 2.5 (calc)   Total Bilirubin 0.8 0.2 - 1.2 mg/dL   Alkaline phosphatase (APISO) 61 33 - 130 U/L   AST 15 10 - 35 U/L   ALT 8 6 - 29 U/L     Assessment & Plan  1. Sore throat Likely a sequela of vomiting, versus acute viral (less likely), recommended to take Tylenol as needed, increase fluid intake, contact PCP within 48 hours if no symptomatic improvement. Point-of-care strep was negative - POCT rapid strep A  2. Gastroenteritis Now resolved, reassured, increase fluid intake, no further workup needed   Jaxton Casale Asad A. [facility] 05/25/2017 10:02 AM

## 2017-05-27 ENCOUNTER — Telehealth: Payer: Self-pay | Admitting: Family Medicine

## 2017-05-27 DIAGNOSIS — J209 Acute bronchitis, unspecified: Secondary | ICD-10-CM

## 2017-05-27 MED ORDER — AZITHROMYCIN 250 MG PO TABS: ORAL_TABLET | ORAL | 0 refills | Status: DC

## 2017-05-27 NOTE — Telephone Encounter (Signed)
Copied from CRM [id number]. Topic: Quick Communication - See Telephone Encounter >> May 27, 2017  2:29 PM Louie BunPalacios Medina, Rosey Batheresa D wrote: CRM for notification. See Telephone encounter for: 05/27/17. Patient needs refill on her azithromycin sent to [facility] on [address].

## 2017-05-27 NOTE — Telephone Encounter (Signed)
Patient walked in stating that she was seen earlier in the week, no better with still some sore throat, yellowish phlegm and coughing, she is requesting an antibiotic. We will send a prescription for azithromycin to her pharmacy.

## 2017-07-04 ENCOUNTER — Ambulatory Visit (INDEPENDENT_AMBULATORY_CARE_PROVIDER_SITE_OTHER): Payer: Medicare Other | Admitting: Family Medicine

## 2017-07-04 ENCOUNTER — Encounter: Payer: Self-pay | Admitting: Family Medicine

## 2017-07-04 DIAGNOSIS — M170 Bilateral primary osteoarthritis of knee: Secondary | ICD-10-CM | POA: Insufficient documentation

## 2017-07-04 DIAGNOSIS — E785 Hyperlipidemia, unspecified: Secondary | ICD-10-CM

## 2017-07-04 DIAGNOSIS — E1169 Type 2 diabetes mellitus with other specified complication: Secondary | ICD-10-CM

## 2017-07-04 DIAGNOSIS — E119 Type 2 diabetes mellitus without complications: Secondary | ICD-10-CM

## 2017-07-04 DIAGNOSIS — M1712 Unilateral primary osteoarthritis, left knee: Secondary | ICD-10-CM

## 2017-07-04 MED ORDER — ATORVASTATIN CALCIUM 10 MG PO TABS: 10.0000 mg | ORAL_TABLET | Freq: Every day | ORAL | 0 refills | Status: DC

## 2017-07-04 MED ORDER — METFORMIN HCL ER 500 MG PO TB24: 500.0000 mg | ORAL_TABLET | Freq: Every day | ORAL | 0 refills | Status: DC

## 2017-07-04 MED ORDER — DICLOFENAC SODIUM 1 % TD GEL: 2.0000 g | Freq: Two times a day (BID) | TRANSDERMAL | 0 refills | Status: DC

## 2017-07-04 NOTE — Progress Notes (Signed)
Name: Kiara Brady   MRN: [id number]    DOB: 1945-11-07   Date:07/04/2017       Progress Note  Subjective  Chief Complaint  Chief Complaint  Patient presents with  . Follow-up    3 months   . Medication Refill    maintence meds  . leg cramps    middle of the night     Diabetes  She presents for her follow-up diabetic visit. She has type 2 diabetes mellitus. Her disease course has been stable. Pertinent negatives for diabetes include no chest pain, no fatigue, no polydipsia and no polyuria. Risk factors for coronary artery disease include diabetes mellitus and dyslipidemia. Current diabetic treatment includes oral agent (monotherapy). She is following a generally healthy diet. Frequency home blood tests: pt. does not check her blood glucose.  Hyperlipidemia  This is a chronic problem. The problem is controlled. Recent lipid tests were reviewed and are normal. Pertinent negatives include no chest pain, leg pain, myalgias or shortness of breath. Current antihyperlipidemic treatment includes statins.  Arthritis  Presents for follow-up visit. Affected locations include the left knee. Associated symptoms include pain at night. Pertinent negatives include no fatigue or fever. (Sometimes has pain in left knee and lower leg. )     Past Medical History:  Diagnosis Date  . Diabetes mellitus without complication (HCC)   . Hyperlipidemia     Past Surgical History:  Procedure Laterality Date  . VARICOSE VEIN SURGERY      Family History  Problem Relation Age of Onset  . Heart disease Mother   . Alzheimer's disease Mother   . Alcohol abuse Father   . Cancer Maternal Grandmother        breast  . Breast cancer Maternal Grandmother     Social History   Socioeconomic History  . Marital status: Divorced    Spouse name: Not on file  . Number of children: Not on file  . Years of education: Not on file  . Highest education level: Not on file  Social Needs  . Financial resource  strain: Not on file  . Food insecurity - worry: Not on file  . Food insecurity - inability: Not on file  . Transportation needs - medical: Not on file  . Transportation needs - non-medical: Not on file  Occupational History  . Not on file  Tobacco Use  . Smoking status: Former Games developer  . Smokeless tobacco: Never Used  Substance and Sexual Activity  . Alcohol use: No  . Drug use: No  . Sexual activity: Not Currently  Other Topics Concern  . Not on file  Social History Narrative  . Not on file     Current Outpatient Medications:  .  ACCU-CHEK AVIVA PLUS test strip, , Disp: , Rfl:  .  ACCU-CHEK SOFTCLIX LANCETS lancets, , Disp: , Rfl:  .  atorvastatin (LIPITOR) 10 MG tablet, Take 1 tablet (10 mg total) by mouth daily at 6 PM., Disp: 90 tablet, Rfl: 0 .  ibuprofen (ADVIL,MOTRIN) 200 MG tablet, Take 200 mg by mouth every 6 (six) hours as needed., Disp: , Rfl:  .  metFORMIN (GLUCOPHAGE-XR) 500 MG 24 hr tablet, Take 1 tablet (500 mg total) by mouth daily., Disp: 90 tablet, Rfl: 0 .  mupirocin cream (BACTROBAN) 2 %, Apply 1 application topically 2 (two) times daily., Disp: 15 g, Rfl: 0  Allergies  Allergen Reactions  . Oxycodone Other (See Comments)  . Penicillin G Hives and Shortness Of Breath  .  Penicillins   . Prednisone Cough  . Xanax [Alprazolam]     hyperactivity     Review of Systems  Constitutional: Negative for fatigue and fever.  Respiratory: Negative for shortness of breath.   Cardiovascular: Negative for chest pain.  Musculoskeletal: Positive for arthritis. Negative for myalgias.  Endo/Heme/Allergies: Negative for polydipsia.     Objective  Vitals:   07/04/17 1606  BP: 122/74  Pulse: 60  Resp: 16  Temp: 97.9 F (36.6 C)  TempSrc: Oral  SpO2: 95%  Weight: 122 lb 3.2 oz (55.4 kg)    Physical Exam  Constitutional: She is oriented to person, place, and time and well-developed, well-nourished, and in no distress.  HENT:  Head: Normocephalic and  atraumatic.  Cardiovascular: Normal rate, regular rhythm and normal heart sounds.  No murmur heard. Pulmonary/Chest: Effort normal and breath sounds normal.  Musculoskeletal:       Left knee: She exhibits swelling. Tenderness found. Medial joint line tenderness noted.       Legs: Neurological: She is alert and oriented to person, place, and time.  Nursing note and vitals reviewed.    Recent Results (from the past 2160 hour(s))  POCT Glucose (CBG)     Status: Normal   Collection Time: 04/11/17  1:15 PM  Result Value Ref Range   POC Glucose 80 70 - 99 mg/dl  POCT HgB [id number]     Status: Normal   Collection Time: 04/11/17  1:17 PM  Result Value Ref Range   Hemoglobin A1C 6.0   Lipid panel     Status: None   Collection Time: 04/25/17  9:30 AM  Result Value Ref Range   Cholesterol 152 <200 mg/dL   HDL 68 >13>50 mg/dL   Triglycerides 97 <244<150 mg/dL   LDL Cholesterol (Calc) 66 mg/dL (calc)    Comment: Reference range: <100 . Desirable range <100 mg/dL for primary prevention;   <70 mg/dL for patients with CHD or diabetic patients  with > or = 2 CHD risk factors. Marland Kitchen. LDL-C is now calculated using the Martin-Hopkins  calculation, which is a validated novel method providing  better accuracy than the Friedewald equation in the  estimation of LDL-C.  Horald PollenMartin SS et al. Lenox AhrJAMA. 0102;725(362013;310(19): 2061-2068  (http://education.[facility].com/faq/FAQ164)    Total CHOL/HDL Ratio 2.2 <5.0 (calc)   Non-HDL Cholesterol (Calc) 84 <644<130 mg/dL (calc)    Comment: For patients with diabetes plus 1 major ASCVD risk  factor, treating to a non-HDL-C goal of <100 mg/dL  (LDL-C of <03<70 mg/dL) is considered a therapeutic  option.   COMPLETE METABOLIC PANEL WITH GFR     Status: Abnormal   Collection Time: 04/25/17  9:30 AM  Result Value Ref Range   Glucose, Bld 105 (H) 65 - 99 mg/dL    Comment: .            Fasting reference interval . For someone without known diabetes, a glucose value between 100 and 125  mg/dL is consistent with prediabetes and should be confirmed with a follow-up test. .    BUN 11 7 - 25 mg/dL   Creat 4.740.81 2.590.60 - 5.630.93 mg/dL    Comment: For patients >72 years of age, the reference limit for Creatinine is approximately 13% higher for people identified as African-American. .    GFR, Est Non African American 73 > OR = 60 mL/min/1.2773m2   GFR, Est African American 84 > OR = 60 mL/min/1.9173m2   BUN/Creatinine Ratio NOT APPLICABLE 6 - 22 (calc)  Sodium 141 135 - 146 mmol/L   Potassium 3.8 3.5 - 5.3 mmol/L   Chloride 106 98 - 110 mmol/L   CO2 30 20 - 32 mmol/L   Calcium 9.3 8.6 - 10.4 mg/dL   Total Protein 6.8 6.1 - 8.1 g/dL   Albumin 4.4 3.6 - 5.1 g/dL   Globulin 2.4 1.9 - 3.7 g/dL (calc)   AG Ratio 1.8 1.0 - 2.5 (calc)   Total Bilirubin 0.8 0.2 - 1.2 mg/dL   Alkaline phosphatase (APISO) 61 33 - 130 U/L   AST 15 10 - 35 U/L   ALT 8 6 - 29 U/L  POCT rapid strep A     Status: Normal   Collection Time: 05/25/17 10:40 AM  Result Value Ref Range   Rapid Strep A Screen Negative Negative     Assessment & Plan  1. Dyslipidemia associated with type 2 diabetes mellitus (HCC)  - atorvastatin (LIPITOR) 10 MG tablet; Take 1 tablet (10 mg total) by mouth daily at 6 PM.  Dispense: 90 tablet; Refill: 0  2. Type 2 diabetes mellitus without complication, without long-term current use of insulin (HCC)  - metFORMIN (GLUCOPHAGE-XR) 500 MG 24 hr tablet; Take 1 tablet (500 mg total) by mouth daily.  Dispense: 90 tablet; Refill: 0  3. Primary osteoarthritis of left knee  - diclofenac sodium (VOLTAREN) 1 % GEL; Apply 2 g topically 2 (two) times daily.  Dispense: 100 g; Refill: 0   Tynell Winchell Asad A. Faylene KurtzShah [facility] 07/04/2017 4:24 PM

## 2017-07-22 ENCOUNTER — Ambulatory Visit: Payer: Medicare Other | Admitting: Family Medicine

## 2017-08-18 ENCOUNTER — Encounter: Payer: Self-pay | Admitting: Family Medicine

## 2017-08-18 ENCOUNTER — Ambulatory Visit (INDEPENDENT_AMBULATORY_CARE_PROVIDER_SITE_OTHER): Payer: Medicare HMO | Admitting: Family Medicine

## 2017-08-18 VITALS — BP 120/76 | HR 73 | Temp 98.1°F | Resp 16 | Wt 120.5 lb

## 2017-08-18 DIAGNOSIS — M1712 Unilateral primary osteoarthritis, left knee: Secondary | ICD-10-CM

## 2017-08-18 NOTE — Progress Notes (Signed)
Name: Kiara Brady   MRN: [id number]    DOB: 1946-02-13   Date:08/18/2017       Progress Note  Subjective  Chief Complaint  Chief Complaint  Patient presents with  . Form Completion    To be excuse from jury duty     HPI  Patient presents to request completing a form to seek exemption from jury duty, she reports that she is "disabled" and has received injections in her knees by the orthopedist and wishes to use that excuse to get exemption from jury duty  Past Medical History:  Diagnosis Date  . Diabetes mellitus without complication (HCC)   . Hyperlipidemia     Past Surgical History:  Procedure Laterality Date  . VARICOSE VEIN SURGERY      Family History  Problem Relation Age of Onset  . Heart disease Mother   . Alzheimer's disease Mother   . Alcohol abuse Father   . Cancer Maternal Grandmother        breast  . Breast cancer Maternal Grandmother     Social History   Socioeconomic History  . Marital status: Divorced    Spouse name: Not on file  . Number of children: Not on file  . Years of education: Not on file  . Highest education level: Not on file  Social Needs  . Financial resource strain: Not on file  . Food insecurity - worry: Not on file  . Food insecurity - inability: Not on file  . Transportation needs - medical: Not on file  . Transportation needs - non-medical: Not on file  Occupational History  . Not on file  Tobacco Use  . Smoking status: Former Games developer  . Smokeless tobacco: Never Used  Substance and Sexual Activity  . Alcohol use: No  . Drug use: No  . Sexual activity: Not Currently  Other Topics Concern  . Not on file  Social History Narrative  . Not on file     Current Outpatient Medications:  .  ACCU-CHEK AVIVA PLUS test strip, , Disp: , Rfl:  .  ACCU-CHEK SOFTCLIX LANCETS lancets, , Disp: , Rfl:  .  atorvastatin (LIPITOR) 10 MG tablet, Take 1 tablet (10 mg total) by mouth daily at 6 PM., Disp: 90 tablet, Rfl: 0 .  diclofenac  sodium (VOLTAREN) 1 % GEL, Apply 2 g topically 2 (two) times daily., Disp: 100 g, Rfl: 0 .  ibuprofen (ADVIL,MOTRIN) 200 MG tablet, Take 200 mg by mouth every 6 (six) hours as needed., Disp: , Rfl:  .  metFORMIN (GLUCOPHAGE-XR) 500 MG 24 hr tablet, Take 1 tablet (500 mg total) by mouth daily., Disp: 90 tablet, Rfl: 0 .  mupirocin cream (BACTROBAN) 2 %, Apply 1 application topically 2 (two) times daily., Disp: 15 g, Rfl: 0  Allergies  Allergen Reactions  . Oxycodone Other (See Comments)  . Penicillin G Hives and Shortness Of Breath  . Penicillins   . Prednisone Cough  . Xanax [Alprazolam]     hyperactivity     ROS  Please see history of present illness for complete discussion of ROS  Objective  Vitals:   08/18/17 1027  BP: 120/76  Pulse: 73  Resp: 16  Temp: 98.1 F (36.7 C)  TempSrc: Oral  SpO2: 99%  Weight: 120 lb 8 oz (54.7 kg)    Physical Exam  Constitutional: She is oriented to person, place, and time and well-developed, well-nourished, and in no distress.  Neurological: She is alert and oriented to person,  place, and time.  Psychiatric: Mood, memory, affect and judgment normal.  Nursing note and vitals reviewed.      Recent Results (from the past 2160 hour(s))  POCT rapid strep A     Status: Normal   Collection Time: 05/25/17 10:40 AM  Result Value Ref Range   Rapid Strep A Screen Negative Negative     Assessment & Plan  1. Primary osteoarthritis of left knee I informed her that if she wishes to use degenerative arthritis of the knee as an excuse for jury service, she will have to take the form for review and completion by her orthopedist. She verbalized agreement   [name] Asad A. Faylene KurtzShah [facility] 08/18/2017 10:47 AM

## 2017-08-24 ENCOUNTER — Ambulatory Visit (INDEPENDENT_AMBULATORY_CARE_PROVIDER_SITE_OTHER): Payer: Medicare HMO | Admitting: Family Medicine

## 2017-08-24 ENCOUNTER — Encounter: Payer: Self-pay | Admitting: Family Medicine

## 2017-08-24 VITALS — BP 120/80 | HR 74 | Temp 98.5°F | Resp 16 | Ht 62.0 in | Wt 118.1 lb

## 2017-08-24 DIAGNOSIS — E119 Type 2 diabetes mellitus without complications: Secondary | ICD-10-CM | POA: Diagnosis not present

## 2017-08-24 DIAGNOSIS — J40 Bronchitis, not specified as acute or chronic: Secondary | ICD-10-CM

## 2017-08-24 DIAGNOSIS — R0981 Nasal congestion: Secondary | ICD-10-CM

## 2017-08-24 DIAGNOSIS — J189 Pneumonia, unspecified organism: Secondary | ICD-10-CM

## 2017-08-24 DIAGNOSIS — J181 Lobar pneumonia, unspecified organism: Secondary | ICD-10-CM | POA: Diagnosis not present

## 2017-08-24 MED ORDER — AZITHROMYCIN 250 MG PO TABS: ORAL_TABLET | ORAL | 0 refills | Status: DC

## 2017-08-24 MED ORDER — BENZONATATE 100 MG PO CAPS: 100.0000 mg | ORAL_CAPSULE | Freq: Three times a day (TID) | ORAL | 0 refills | Status: DC | PRN

## 2017-08-24 MED ORDER — LORATADINE 10 MG PO TABS: 10.0000 mg | ORAL_TABLET | Freq: Every day | ORAL | 0 refills | Status: DC | PRN

## 2017-08-24 NOTE — Patient Instructions (Addendum)

## 2017-08-24 NOTE — Progress Notes (Signed)
Name: Kiara Brady   MRN: [id number]    DOB: 1945/09/12   Date:08/24/2017       Progress Note  Subjective  Chief Complaint  Chief Complaint  Patient presents with  . URI    hoarse, cough, congested    HPI  Pt presents with 2-3 days of respiratory illness. Symptoms include hoarse voice, dry cough, nasal congestion, some mild shortness of breath, body aches, headache.  Denies: ear pain/pressure, chest pain, fevers/chills.  She has tried to take vinegar bath, gargled with salt water and honey, and these helped her voice a little bit.  She has T2DM: Does not check BG's at home, last A1C November 2018 was 6.0%; we will avoid oral corticosteroids for the time being.  Notes allergy to prednisone - became dizzy and felt hyper, does not want to try inhaled steroid today or other inhaler.  Patient Active Problem List   Diagnosis Date Noted  . Degenerative arthritis of left knee 07/04/2017  . Chest congestion 06/28/2016  . Hyperlipidemia 03/23/2016  . Cardiac murmur, previously undiagnosed 01/23/2016  . RLL pneumonia (HCC) 01/23/2016  . Acute bronchitis 01/15/2016  . Productive cough 11/05/2015  . Heart murmur 09/03/2015  . Erroneous encounter - disregard 08/05/2015  . Left lateral knee pain 06/30/2015  . Pain of left knee and lower leg 06/10/2015  . Dyslipidemia associated with type 2 diabetes mellitus (HCC) 02/26/2015  . 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 Diabetes mellitus, type 2 (HCC) 01/06/2015    Social History   Tobacco Use  . Smoking status: Former Games developer  . Smokeless tobacco: Never Used  Substance Use Topics  . Alcohol use: No     Current Outpatient Medications:  .  ACCU-CHEK AVIVA PLUS test strip, , Disp: , Rfl:  .  ACCU-CHEK SOFTCLIX LANCETS lancets, , Disp: , Rfl:  .  atorvastatin (LIPITOR) 10 MG tablet, Take 1 tablet (10 mg total) by mouth daily at 6 PM., Disp: 90 tablet, Rfl: 0 .  diclofenac sodium (VOLTAREN) 1 % GEL, Apply 2 g topically 2 (two) times  daily., Disp: 100 g, Rfl: 0 .  ibuprofen (ADVIL,MOTRIN) 200 MG tablet, Take 200 mg by mouth every 6 (six) hours as needed., Disp: , Rfl:  .  metFORMIN (GLUCOPHAGE-XR) 500 MG 24 hr tablet, Take 1 tablet (500 mg total) by mouth daily., Disp: 90 tablet, Rfl: 0 .  mupirocin cream (BACTROBAN) 2 %, Apply 1 application topically 2 (two) times daily., Disp: 15 g, Rfl: 0  Allergies  Allergen Reactions  . Oxycodone Other (See Comments)  . Penicillin G Hives and Shortness Of Breath  . Penicillins   . Prednisone Cough  . Xanax [Alprazolam]     hyperactivity    ROS  Ten systems reviewed and is negative except as mentioned in HPI  Objective  Vitals:   08/24/17 1509  BP: 120/80  Pulse: 74  Resp: 16  Temp: 98.5 F (36.9 C)  TempSrc: Oral  SpO2: 93%  Weight: 118 lb 1.6 oz (53.6 kg)  Height: 5\' 2"  (1.575 m)   Body mass index is 21.6 kg/m.  Nursing Note and Vital Signs reviewed.  Physical Exam  Constitutional: Patient appears well-developed and well-nourished. No distress.  HEENT: head atraumatic, normocephalic, pupils equal and reactive to light, EOM's intact, TM's without erythema or bulging, no maxillary or frontal sinus tenderness , neck supple without lymphadenopathy, oropharynx pink and moist without exudate Cardiovascular: Normal rate, regular rhythm, S1/S2 present.  No murmur or rub heard. No BLE edema. Pulmonary/Chest:  Effort normal and breath sounds clear but diminished in the LLL. No respiratory distress or retractions. Psychiatric: Patient has a normal mood and affect. behavior is normal. Judgment and thought content normal.  No results found for this or any previous visit (from the past 72 hour(s)).  Assessment & Plan  1. Community acquired pneumonia of left lower lobe of lung (HCC) - azithromycin (ZITHROMAX) 250 MG tablet; Day 1: Take 2 tabs; Days 2-5: Take 1 tab daily  Dispense: 6 tablet; Refill: 0  2. Bronchitis - benzonatate (TESSALON PERLES) 100 MG capsule; Take  1-2 capsules (100-200 mg total) by mouth 3 (three) times daily as needed.  Dispense: 30 capsule; Refill: 0  3. Nasal congestion - loratadine (CLARITIN) 10 MG tablet; Take 1 tablet (10 mg total) by mouth daily as needed for allergies or rhinitis.  Dispense: 15 tablet; Refill: 0  4. Type 2 diabetes mellitus without complication, without long-term current use of insulin (HCC) - Discussed higher risk of infection due to history of diabetes; maintain healthy diet and medication regimen.  -Red flags and when to present for emergency care or RTC including fever >101.63F, chest pain, shortness of breath, new/worsening/un-resolving symptoms, reviewed with patient at time of visit. Follow up and care instructions discussed and provided in AVS.

## 2017-11-17 ENCOUNTER — Other Ambulatory Visit: Payer: Self-pay

## 2017-11-17 DIAGNOSIS — E119 Type 2 diabetes mellitus without complications: Secondary | ICD-10-CM

## 2017-11-17 MED ORDER — METFORMIN HCL ER 500 MG PO TB24
500.0000 mg | ORAL_TABLET | Freq: Every day | ORAL | 0 refills | Status: DC
Start: 2017-11-17 — End: 2018-03-02

## 2017-11-17 NOTE — Telephone Encounter (Signed)
Last Cr reviewed; has upcoming appt Rx approved

## 2017-11-21 ENCOUNTER — Other Ambulatory Visit: Payer: Self-pay | Admitting: Family Medicine

## 2017-11-21 DIAGNOSIS — E785 Hyperlipidemia, unspecified: Principal | ICD-10-CM

## 2017-11-21 DIAGNOSIS — E1169 Type 2 diabetes mellitus with other specified complication: Secondary | ICD-10-CM

## 2017-11-21 NOTE — Telephone Encounter (Signed)
Copied from CRM [phone]. Topic: General - Other >> Nov 21, 2017 11:09 AM Elliot GaultBell, Tiffany M wrote:  Relation to pt: self Call back number: [phone] [facility]  [facility] [address] - [place], [place] - [id number] [phone] (Phone) [phone] (Fax)  Reason for call:  Patient requesting a refill atorvastatin (LIPITOR) 10 MG tablet and metFORMIN (GLUCOPHAGE-XR) 500 MG 24 hr tablet to hold her over until her transfer of care appointment for 12/19/17. Patient spoke with pharmacy today and they denied receiving metformin Rx sent over on 11/17/17.Patient aware of 48 to 72 hour turn around time but stated she's completely out, please advise

## 2017-11-21 NOTE — Telephone Encounter (Signed)
LOV 07/04/18

## 2017-11-22 MED ORDER — ATORVASTATIN CALCIUM 10 MG PO TABS: 10.0000 mg | ORAL_TABLET | Freq: Every day | ORAL | 0 refills | Status: DC

## 2017-11-29 DIAGNOSIS — M170 Bilateral primary osteoarthritis of knee: Secondary | ICD-10-CM | POA: Diagnosis not present

## 2017-12-19 ENCOUNTER — Encounter: Payer: Self-pay | Admitting: Family Medicine

## 2017-12-19 ENCOUNTER — Ambulatory Visit (INDEPENDENT_AMBULATORY_CARE_PROVIDER_SITE_OTHER): Payer: Medicare HMO | Admitting: Family Medicine

## 2017-12-19 VITALS — BP 124/82 | HR 71 | Temp 98.3°F | Resp 14 | Ht 62.0 in | Wt 118.4 lb

## 2017-12-19 DIAGNOSIS — Z1239 Encounter for other screening for malignant neoplasm of breast: Secondary | ICD-10-CM

## 2017-12-19 DIAGNOSIS — Z5181 Encounter for therapeutic drug level monitoring: Secondary | ICD-10-CM

## 2017-12-19 DIAGNOSIS — M85859 Other specified disorders of bone density and structure, unspecified thigh: Secondary | ICD-10-CM

## 2017-12-19 DIAGNOSIS — I83813 Varicose veins of bilateral lower extremities with pain: Secondary | ICD-10-CM | POA: Diagnosis not present

## 2017-12-19 DIAGNOSIS — E785 Hyperlipidemia, unspecified: Secondary | ICD-10-CM | POA: Diagnosis not present

## 2017-12-19 DIAGNOSIS — E1169 Type 2 diabetes mellitus with other specified complication: Secondary | ICD-10-CM

## 2017-12-19 DIAGNOSIS — E119 Type 2 diabetes mellitus without complications: Secondary | ICD-10-CM

## 2017-12-19 DIAGNOSIS — Z1231 Encounter for screening mammogram for malignant neoplasm of breast: Secondary | ICD-10-CM | POA: Diagnosis not present

## 2017-12-19 HISTORY — DX: Other specified disorders of bone density and structure, unspecified thigh: M85.859

## 2017-12-19 NOTE — Patient Instructions (Addendum)
You will be due for a PPSV-23 pneumonia shot on or after July 01, 2018 Let's get labs today We'll have you see the eye doctor If you have not heard anything from my staff in a week about any orders/referrals/studies from today, please contact us here to follow-up [phone] Please do call to schedule your bone density study; the number to schedule one at either [facility] or [facility] is [phone] or [phone] Try horse chestnut for vein health Please do see your eye doctor regularly, and have your eyes examined every year (or more often per his or her recommendation) Check your feet every night and let me know right away of any sores, infections, numbness, etc. Try to limit sweets, white bread, white rice, white potatoes It is okay with me for you to not check your fingerstick blood sugars (per [employer] Best Practices), unless you are interested and feel it would be helpful for you

## 2017-12-19 NOTE — Progress Notes (Signed)
BP 124/82   Pulse 71   Temp 98.3 F (36.8 C) (Oral)   Resp 14   Ht  (1.575 m)   Wt 118 lb 6.4 oz (53.7 kg)   SpO2 95%   BMI 21.66 kg/m    Subjective:    Patient ID: Kiara Brady, female    DOB: 11-04-1945, 72 y.o.   MRN: [id number]  HPI: Kiara Brady is a 72 y.o. female  Chief Complaint  Patient presents with  . Follow-up    former [facility] pt    HPI Patient is new to me; previous provider left our practice  Type 2 diabetes; about 3 years duration; no fam hx to her knowledge; adopted American diet, eating donuts Taking metformin; some dry mouth; problems with her eyes; wears glasses for reading Lab Results  Component Value Date   HGBA1C 5.7 (H) 12/19/2017    High cholesterol; not eating well, "I'm terrible at eating"; taking statin Lab Results  Component Value Date   CHOL 136 12/19/2017   HDL 71 12/19/2017   LDLCALC 48 12/19/2017   TRIG 89 12/19/2017   CHOLHDL 1.9 12/19/2017   She is seeing orthopaedist at [facility] for her knee, right knee; it's "old"  She has varicose veins in both legs; started to have treatment but stopped in middle of treatment  Osteopenia; had DEXA in 2016  Depression screen [facility] 2/9 12/19/2017 08/18/2017 07/04/2017 05/25/2017 05/17/2017  Decreased Interest 0 0 0 0 0  Down, Depressed, Hopeless 0 0 0 0 0  PHQ - 2 Score 0 0 0 0 0    Relevant past medical, surgical, family and social history reviewed Past Medical History:  Diagnosis Date  . Diabetes mellitus without complication (HCC)   . Hyperlipidemia    Past Surgical History:  Procedure Laterality Date  . VARICOSE VEIN SURGERY     Family History  Problem Relation Age of Onset  . Heart disease Mother   . Alzheimer's disease Mother   . Alcohol abuse Father   . Cancer Maternal Grandmother        breast  . Breast cancer Maternal Grandmother    Social History   Tobacco Use  . Smoking status: Former Games developer  . Smokeless tobacco: Never Used  Substance Use Topics  .  Alcohol use: No  . Drug use: No    Interim medical history since last visit reviewed. Allergies and medications reviewed  Review of Systems Per HPI unless specifically indicated above     Objective:    BP 124/82   Pulse 71   Temp 98.3 F (36.8 C) (Oral)   Resp 14   Ht  (1.575 m)   Wt 118 lb 6.4 oz (53.7 kg)   SpO2 95%   BMI 21.66 kg/m   Wt Readings from Last 3 Encounters:  12/19/17 118 lb 6.4 oz (53.7 kg)  08/24/17 118 lb 1.6 oz (53.6 kg)  08/18/17 120 lb 8 oz (54.7 kg)    Physical Exam  Constitutional: She appears well-developed and well-nourished. No distress.  HENT:  Head: Normocephalic and atraumatic.  Eyes: EOM are normal. No scleral icterus.  Neck: No thyromegaly present.  Cardiovascular: Normal rate, regular rhythm and normal heart sounds.  No murmur heard. Pulmonary/Chest: Effort normal and breath sounds normal. No respiratory distress. She has no wheezes.  Abdominal: Soft. Bowel sounds are normal. She exhibits no distension.  Musculoskeletal: Normal range of motion. She exhibits no edema.  Neurological: She is alert. She exhibits normal  muscle tone.  Skin: Skin is warm and dry. She is not diaphoretic. No pallor.  Psychiatric: She has a normal mood and affect. Her behavior is normal. Judgment and thought content normal.   Diabetic Foot Form - Detailed   Diabetic Foot Exam - detailed Diabetic Foot exam was performed with the following findings:  Yes 12/19/2017  3:44 PM  Visual Foot Exam completed.:  Yes  Pulse Foot Exam completed.:  Yes  Right Dorsalis Pedis:  Present Left Dorsalis Pedis:  Present  Sensory Foot Exam Completed.:  Yes Semmes-Weinstein Monofilament Test R Site 1-Great Toe:  Pos L Site 1-Great Toe:  Pos          Assessment & Plan:   Problem List Items Addressed This Visit      Cardiovascular and Mediastinum   Varicose veins of both lower extremities with pain     Endocrine   Dyslipidemia associated with type 2 diabetes mellitus  (HCC)    Check cholesterol; limit saturated fats; egg yolks; continue statin      Relevant Orders   Lipid panel (Completed)   Diabetes mellitus, type 2 (HCC) - Primary    Check A1c and urine microalbumin:Cr; avoid sugary drinks; healthier diet encouraged; eye referral entered, importance of getting yearly eye exams reviewed      Relevant Orders   Hemoglobin A1C (Completed)   Urine Microalbumin w/creat. ratio (Completed)   Ambulatory referral to Ophthalmology     Musculoskeletal and Integument   Osteopenia of neck of femur   Relevant Orders   DG Bone Density    Other Visit Diagnoses    Medication monitoring encounter       Relevant Orders   COMPLETE METABOLIC PANEL WITH GFR (Completed)   Screening for breast cancer       Relevant Orders   MM 3D SCREEN BREAST BILATERAL       Follow up plan: Return in about 6 months (around 06/21/2018) for follow-up visit with Dr. Sherie Don; Medicare Wellness with [facility] when due.  An after-visit summary was printed and given to the patient at check-out.  Please see the patient instructions which may contain other information and recommendations beyond what is mentioned above in the assessment and plan.  No orders of the defined types were placed in this encounter.   Orders Placed This Encounter  Procedures  . DG Bone Density  . MM 3D SCREEN BREAST BILATERAL  . Hemoglobin A1C  . Urine Microalbumin w/creat. ratio  . COMPLETE METABOLIC PANEL WITH GFR  . Lipid panel  . Ambulatory referral to Ophthalmology

## 2017-12-19 NOTE — Assessment & Plan Note (Signed)
Check A1c and urine microalbumin:Cr; avoid sugary drinks; healthier diet encouraged; eye referral entered, importance of getting yearly eye exams reviewed

## 2017-12-19 NOTE — Assessment & Plan Note (Signed)
Check cholesterol; limit saturated fats; egg yolks; continue statin

## 2017-12-20 ENCOUNTER — Other Ambulatory Visit: Payer: Self-pay | Admitting: Family Medicine

## 2017-12-20 LAB — LIPID PANEL
CHOLESTEROL: 136 mg/dL (ref <200)
HDL: 71 mg/dL (ref >50)
LDL Cholesterol (Calc): 48 mg/dL (calc)
Non-HDL Cholesterol (Calc): 65 mg/dL (calc) (ref <130)
Total CHOL/HDL Ratio: 1.9 (calc) (ref <5.0)
Triglycerides: 89 mg/dL (ref <150)

## 2017-12-20 LAB — MICROALBUMIN / CREATININE URINE RATIO
Creatinine, Urine: 37 mg/dL (ref 20–275)
Microalb Creat Ratio: 8 mcg/mg creat (ref <30)
Microalb, Ur: 0.3 mg/dL

## 2017-12-20 LAB — HEMOGLOBIN A1C
HEMOGLOBIN A1C: 5.7 %{Hb} — AB (ref <5.7)
Mean Plasma Glucose: 117 (calc)
eAG (mmol/L): 6.5 (calc)

## 2017-12-20 LAB — COMPLETE METABOLIC PANEL WITH GFR
AG Ratio: 1.8 (calc) (ref 1.0–2.5)
ALKALINE PHOSPHATASE (APISO): 59 U/L (ref 33–130)
ALT: 10 U/L (ref 6–29)
AST: 18 U/L (ref 10–35)
Albumin: 4.5 g/dL (ref 3.6–5.1)
BILIRUBIN TOTAL: 0.6 mg/dL (ref 0.2–1.2)
BUN: 7 mg/dL (ref 7–25)
CHLORIDE: 105 mmol/L (ref 98–110)
CO2: 30 mmol/L (ref 20–32)
Calcium: 9.2 mg/dL (ref 8.6–10.4)
Creat: 0.81 mg/dL (ref 0.60–0.93)
GFR, Est African American: 84 mL/min/{1.73_m2} (ref >=60)
GFR, Est Non African American: 73 mL/min/{1.73_m2} (ref >=60)
GLUCOSE: 90 mg/dL (ref 65–139)
Globulin: 2.5 g/dL (calc) (ref 1.9–3.7)
Potassium: 3.1 mmol/L — ABNORMAL LOW (ref 3.5–5.3)
SODIUM: 143 mmol/L (ref 135–146)
Total Protein: 7 g/dL (ref 6.1–8.1)

## 2017-12-20 MED ORDER — POTASSIUM CHLORIDE ER 10 MEQ PO TBCR: 10.0000 meq | EXTENDED_RELEASE_TABLET | Freq: Two times a day (BID) | ORAL | 0 refills | Status: DC

## 2017-12-20 NOTE — Progress Notes (Signed)
klor Rx

## 2018-01-11 DIAGNOSIS — M170 Bilateral primary osteoarthritis of knee: Secondary | ICD-10-CM | POA: Diagnosis not present

## 2018-01-13 DIAGNOSIS — H25013 Cortical age-related cataract, bilateral: Secondary | ICD-10-CM | POA: Diagnosis not present

## 2018-01-13 DIAGNOSIS — E089 Diabetes mellitus due to underlying condition without complications: Secondary | ICD-10-CM | POA: Diagnosis not present

## 2018-01-13 DIAGNOSIS — E119 Type 2 diabetes mellitus without complications: Secondary | ICD-10-CM | POA: Diagnosis not present

## 2018-01-13 DIAGNOSIS — H524 Presbyopia: Secondary | ICD-10-CM | POA: Diagnosis not present

## 2018-01-13 DIAGNOSIS — H2513 Age-related nuclear cataract, bilateral: Secondary | ICD-10-CM | POA: Diagnosis not present

## 2018-01-14 LAB — HM DIABETES EYE EXAM

## 2018-01-17 ENCOUNTER — Ambulatory Visit
Admission: RE | Admit: 2018-01-17 | Discharge: 2018-01-17 | Disposition: A | Discharge status: Home / Self Care | Payer: Medicare HMO | Source: Ambulatory Visit | Attending: Family Medicine | Admitting: Family Medicine

## 2018-01-17 DIAGNOSIS — Z1239 Encounter for other screening for malignant neoplasm of breast: Secondary | ICD-10-CM

## 2018-01-17 DIAGNOSIS — Z1231 Encounter for screening mammogram for malignant neoplasm of breast: Secondary | ICD-10-CM | POA: Insufficient documentation

## 2018-01-18 DIAGNOSIS — M170 Bilateral primary osteoarthritis of knee: Secondary | ICD-10-CM | POA: Diagnosis not present

## 2018-01-27 DIAGNOSIS — M170 Bilateral primary osteoarthritis of knee: Secondary | ICD-10-CM | POA: Diagnosis not present

## 2018-01-30 DIAGNOSIS — Z0100 Encounter for examination of eyes and vision without abnormal findings: Secondary | ICD-10-CM | POA: Diagnosis not present

## 2018-01-31 ENCOUNTER — Ambulatory Visit
Admission: RE | Admit: 2018-01-31 | Discharge: 2018-01-31 | Disposition: A | Discharge status: Home / Self Care | Payer: Medicare HMO | Source: Ambulatory Visit | Attending: Family Medicine | Admitting: Family Medicine

## 2018-01-31 DIAGNOSIS — M85859 Other specified disorders of bone density and structure, unspecified thigh: Secondary | ICD-10-CM

## 2018-01-31 DIAGNOSIS — E119 Type 2 diabetes mellitus without complications: Secondary | ICD-10-CM | POA: Insufficient documentation

## 2018-01-31 DIAGNOSIS — Z780 Asymptomatic menopausal state: Secondary | ICD-10-CM | POA: Diagnosis not present

## 2018-01-31 DIAGNOSIS — Z7984 Long term (current) use of oral hypoglycemic drugs: Secondary | ICD-10-CM | POA: Diagnosis not present

## 2018-01-31 DIAGNOSIS — M810 Age-related osteoporosis without current pathological fracture: Secondary | ICD-10-CM | POA: Insufficient documentation

## 2018-02-06 ENCOUNTER — Encounter: Payer: Self-pay | Admitting: Family Medicine

## 2018-02-06 DIAGNOSIS — M810 Age-related osteoporosis without current pathological fracture: Secondary | ICD-10-CM

## 2018-02-06 HISTORY — DX: Age-related osteoporosis without current pathological fracture: M81.0

## 2018-02-09 ENCOUNTER — Ambulatory Visit: Payer: Medicare HMO

## 2018-03-02 ENCOUNTER — Other Ambulatory Visit: Payer: Self-pay | Admitting: Family Medicine

## 2018-03-02 DIAGNOSIS — E119 Type 2 diabetes mellitus without complications: Secondary | ICD-10-CM

## 2018-03-02 DIAGNOSIS — E1169 Type 2 diabetes mellitus with other specified complication: Secondary | ICD-10-CM

## 2018-03-02 DIAGNOSIS — E785 Hyperlipidemia, unspecified: Secondary | ICD-10-CM

## 2018-03-02 NOTE — Telephone Encounter (Signed)
Copied from CRM [id number]. Topic: Quick Communication - Rx Refill/Question >> Mar 02, 2018 12:13 PM Floria RavelingStovall, Shana A wrote: Medication: atorvastatin (LIPITOR) 10 MG tablet [[id number] metFORMIN (GLUCOPHAGE-XR) 500 MG 24 hr tablet [[id number]][[id number]]  Has the patient contacted their pharmacy?  No  (Agent: If no, request that the patient contact the pharmacy for the refill.) (Agent: If yes, when and what did the pharmacy advise?)  Preferred Pharmacy (with phone number or street name): [facility] [address] - [place], [place] - [id number] [phone] (Phone)   Agent: Please be advised that RX refills may take up to 3 business days. We ask that you follow-up with your pharmacy.

## 2018-03-06 ENCOUNTER — Encounter: Payer: Self-pay | Admitting: Family Medicine

## 2018-03-06 ENCOUNTER — Ambulatory Visit (INDEPENDENT_AMBULATORY_CARE_PROVIDER_SITE_OTHER): Payer: Medicare HMO | Admitting: Family Medicine

## 2018-03-06 DIAGNOSIS — E1169 Type 2 diabetes mellitus with other specified complication: Secondary | ICD-10-CM

## 2018-03-06 DIAGNOSIS — E785 Hyperlipidemia, unspecified: Secondary | ICD-10-CM | POA: Diagnosis not present

## 2018-03-06 DIAGNOSIS — E119 Type 2 diabetes mellitus without complications: Secondary | ICD-10-CM

## 2018-03-06 DIAGNOSIS — M1712 Unilateral primary osteoarthritis, left knee: Secondary | ICD-10-CM | POA: Diagnosis not present

## 2018-03-06 DIAGNOSIS — M810 Age-related osteoporosis without current pathological fracture: Secondary | ICD-10-CM

## 2018-03-06 DIAGNOSIS — E782 Mixed hyperlipidemia: Secondary | ICD-10-CM | POA: Diagnosis not present

## 2018-03-06 MED ORDER — ALENDRONATE SODIUM 70 MG PO TABS: 70.0000 mg | ORAL_TABLET | ORAL | 11 refills | Status: DC

## 2018-03-06 NOTE — Assessment & Plan Note (Signed)
Check lipids and A1c in November

## 2018-03-06 NOTE — Assessment & Plan Note (Signed)
Start fosamax; calcium; fall precautions; rescan (DEXA) in one year

## 2018-03-06 NOTE — Progress Notes (Signed)
BP 132/68   Pulse 70   Temp 98.2 F (36.8 C) (Oral)   Resp 14   Ht 5\' 2"  (1.575 m)   Wt 122 lb 14.4 oz (55.7 kg)   SpO2 95%   BMI 22.48 kg/m    Subjective:    Patient ID: Kiara Brady, female    DOB: 1946/07/13, 72 y.o.   MRN: [id number]  HPI: Kiara Brady is a 72 y.o. female  Chief Complaint  Patient presents with  . Follow-up  . Knee Pain    already see's [facility]    HPI Patient is here for f/u  Type 2 diabetes; not checking feet; urine microalb:Cr; not checking sugars; eats a lot of chocolate and ice cream; she just saw her eye doctor in June; goes once a year Lab Results  Component Value Date   HGBA1C 5.7 (H) 12/19/2017    High cholesterol; some ice cream and chocolate; on statin Lab Results  Component Value Date   CHOL 136 12/19/2017   HDL 71 12/19/2017   LDLCALC 48 12/19/2017   TRIG 89 12/19/2017   CHOLHDL 1.9 12/19/2017    She has knee pain and is already seeing an orthopaedist for this ([facility]) She has seen Dr. Martha Clan; he put shots in her knees; last time, the shots worked great for a year; this time, really no help; active, likes to play with dog; had xrays she says; bilateral knee osteoarthritis, note reviewed; 3rd Euflexxa injection given; that didn't help; using topical just OTC  Osteoporosis; she cannot take vitamin D; makes her feel sick; feels like throwing up, throwing up when taking vitamin D; she stopped taking it, has tried a few times and cannot tolerate it; she gets her calcium in chocolate; lots of veggies  Reviewed DEXA report; significant osteoporosis   Depression screen [facility] 2/9 03/06/2018 12/19/2017 08/18/2017 07/04/2017 05/25/2017  Decreased Interest 0 0 0 0 0  Down, Depressed, Hopeless 0 0 0 0 0  PHQ - 2 Score 0 0 0 0 0    Relevant past medical, surgical, family and social history reviewed Past Medical History:  Diagnosis Date  . Diabetes mellitus without complication (HCC)   . Hyperlipidemia   . Osteopenia of  neck of femur 12/19/2017  . Osteoporosis 02/06/2018   July 2019   Past Surgical History:  Procedure Laterality Date  . VARICOSE VEIN SURGERY     Family History  Problem Relation Age of Onset  . Heart disease Mother   . Alzheimer's disease Mother   . Alcohol abuse Father   . Cancer Maternal Grandmother        breast  . Breast cancer Maternal Grandmother    Social History   Tobacco Use  . Smoking status: Former Games developer  . Smokeless tobacco: Never Used  Substance Use Topics  . Alcohol use: No  . Drug use: No    Interim medical history since last visit reviewed. Allergies and medications reviewed  Review of Systems Per HPI unless specifically indicated above     Objective:    BP 132/68   Pulse 70   Temp 98.2 F (36.8 C) (Oral)   Resp 14   Ht 5\' 2"  (1.575 m)   Wt 122 lb 14.4 oz (55.7 kg)   SpO2 95%   BMI 22.48 kg/m   Wt Readings from Last 3 Encounters:  03/06/18 122 lb 14.4 oz (55.7 kg)  12/19/17 118 lb 6.4 oz (53.7 kg)  08/24/17 118 lb 1.6 oz (  53.6 kg)    Physical Exam  Constitutional: She appears well-developed and well-nourished. No distress.  HENT:  Head: Normocephalic and atraumatic.  Eyes: EOM are normal. No scleral icterus.  Neck: No thyromegaly present.  Cardiovascular: Normal rate, regular rhythm and normal heart sounds.  No murmur heard. Pulmonary/Chest: Effort normal and breath sounds normal. No respiratory distress. She has no wheezes.  Abdominal: Soft. Bowel sounds are normal. She exhibits no distension.  Musculoskeletal: She exhibits no edema.       Right knee: She exhibits effusion. She exhibits normal range of motion and no swelling.       Left knee: She exhibits effusion. She exhibits normal range of motion and no swelling.  Neurological: She is alert.  Skin: Skin is warm and dry. She is not diaphoretic. No pallor.  Psychiatric: She has a normal mood and affect. Her behavior is normal. Judgment and thought content normal.   Diabetic Foot  Form - Detailed   Diabetic Foot Exam - detailed Diabetic Foot exam was performed with the following findings:  Yes 03/06/2018  2:31 PM  Visual Foot Exam completed.:  Yes  Pulse Foot Exam completed.:  Yes  Right Dorsalis Pedis:  Present Left Dorsalis Pedis:  Present  Sensory Foot Exam Completed.:  Yes Semmes-Weinstein Monofilament Test R Site 1-Great Toe:  Pos L Site 1-Great Toe:  Pos         Results for orders placed or performed in visit on 01/16/18  HM DIABETES EYE EXAM  Result Value Ref Range   HM Diabetic Eye Exam No Retinopathy No Retinopathy      Assessment & Plan:   Problem List Items Addressed This Visit      Endocrine   Dyslipidemia associated with type 2 diabetes mellitus (HCC)    Check lipids and A1c in November      Diabetes mellitus, type 2 (HCC)    Foot exam by MD today        Musculoskeletal and Integument   Osteoporosis    Start fosamax; calcium; fall precautions; rescan (DEXA) in one year      Relevant Medications   alendronate (FOSAMAX) 70 MG tablet   Degenerative arthritis of left knee    Seeing Dr. Martha ClanKrasinski, ortho; suggested turmeric and tylenol        Other   Hyperlipidemia    Last lipids reviewed; continue same regimen; watch diet          Follow up plan: Return in about 4 months (around 06/22/2018) for twenty minute follow-up with fasting labs.  An after-visit summary was printed and given to the patient at check-out.  Please see the patient instructions which may contain other information and recommendations beyond what is mentioned above in the assessment and plan.  Meds ordered this encounter  Medications  . alendronate (FOSAMAX) 70 MG tablet    Sig: Take 1 tablet (70 mg total) by mouth every 7 (seven) days. Take with a full glass of water on an empty stomach.    Dispense:  4 tablet    Refill:  11    No orders of the defined types were placed in this encounter.

## 2018-03-06 NOTE — Assessment & Plan Note (Signed)
Foot exam by MD today 

## 2018-03-06 NOTE — Assessment & Plan Note (Signed)
Seeing Dr. Martha ClanKrasinski, ortho; suggested turmeric and tylenol

## 2018-03-06 NOTE — Patient Instructions (Addendum)
Try turmeric as a natural anti-inflammatory (for pain and arthritis). It comes in capsules where you buy aspirin and fish oil, but also as a spice where you buy pepper and garlic powder.  Try tylenol for knee pain, follow the package directions  Please do see your eye doctor regularly, and have your eyes examined every year (or more often per his or her recommendation) Check your feet every night and let me know right away of any sores, infections, numbness, etc. Try to limit sweets, white bread, white rice, white potatoes It is okay with me for you to not check your fingerstick blood sugars (per [employer] Best Practices), unless you are interested and feel it would be helpful for you  Try to limit saturated fats in your diet (bologna, hot dogs, barbeque, cheeseburgers, hamburgers, steak, bacon, sausage, cheese, etc.) and get more fresh fruits, vegetables, and whole grains  Start the new medicine for the osteoporosis  Repeat your bone density scan in one year and make sure we are making progress Try to get 1200 mg of calcium daily in your food / drink or take a supplement  Alendronate weekly tablets What is this medicine? ALENDRONATE (a LEN droe nate) slows calcium loss from bones. It helps to make healthy bone and to slow bone loss in people with osteoporosis. It may be used to treat Paget's disease. This medicine may be used for other purposes; ask your health care provider or pharmacist if you have questions. COMMON BRAND NAME(S): Fosamax What should I tell my health care provider before I take this medicine? They need to know if you have any of these conditions: -esophagus, stomach, or intestine problems, like acid-reflux or GERD -dental disease -kidney disease -low blood calcium -low vitamin D -problems swallowing -problems sitting or standing for 30 minutes -an unusual or allergic reaction to alendronate, other medicines, foods, dyes, or  preservatives -pregnant or trying to get pregnant -breast-feeding How should I use this medicine? You must take this medicine exactly as directed or you will lower the amount of medicine you absorb into your body or you may cause yourself harm. Take your dose by mouth first thing in the morning, after you are up for the day. Do not eat or drink anything before you take this medicine. Swallow your medicine with a full glass (6 to 8 fluid ounces) of plain water. Do not take this tablet with any other drink. Do not chew or crush the tablet. After taking this medicine, do not eat breakfast, drink, or take any medicines or vitamins for at least 30 minutes. Stand or sit up for at least 30 minutes after you take this medicine; do not lie down. Take this medicine on the same day every week. Do not take your medicine more often than directed. Talk to your pediatrician regarding the use of this medicine in children. Special care may be needed. Overdosage: If you think you have taken too much of this medicine contact a poison control center or emergency room at once. NOTE: This medicine is only for you. Do not share this medicine with others. What if I miss a dose? If you miss a dose, take the dose on the morning after you remember. Then take your next dose on your regular day of the week. Never take 2 tablets on the same day. Do not take double or extra doses. What may interact with this medicine? -aluminum hydroxide -antacids -aspirin -calcium supplements -drugs for inflammation like ibuprofen, naproxen, and  others -iron supplements -magnesium supplements -vitamins with minerals This list may not describe all possible interactions. Give your health care provider a list of all the medicines, herbs, non-prescription drugs, or dietary supplements you use. Also tell them if you smoke, drink alcohol, or use illegal drugs. Some items may interact with your medicine. What should I watch for while using this  medicine? Visit your doctor or health care professional for regular checks ups. It may be some time before you see benefit from this medicine. Do not stop taking your medication except on your doctor's advice. Your doctor or health care professional may order blood tests and other tests to see how you are doing. You should make sure you get enough calcium and vitamin D while you are taking this medicine, unless your doctor tells you not to. Discuss the foods you eat and the vitamins you take with your health care professional. Some people who take this medicine have severe bone, joint, and/or muscle pain. This medicine may also increase your risk for a broken thigh bone. Tell your doctor right away if you have pain in your upper leg or groin. Tell your doctor if you have any pain that does not go away or that gets worse. This medicine can make you more sensitive to the sun. If you get a rash while taking this medicine, sunlight may cause the rash to get worse. Keep out of the sun. If you cannot avoid being in the sun, wear protective clothing and use sunscreen. Do not use sun lamps or tanning beds/booths. What side effects may I notice from receiving this medicine? Side effects that you should report to your doctor or health care professional as soon as possible: -allergic reactions like skin rash, itching or hives, swelling of the face, lips, or tongue -black or tarry stools -bone, muscle or joint pain -changes in vision -chest pain -heartburn or stomach pain -jaw pain, especially after dental work -pain or trouble when swallowing -redness, blistering, peeling or loosening of the skin, including inside the mouth Side effects that usually do not require medical attention (report to your doctor or health care professional if they continue or are bothersome): -changes in taste -diarrhea or constipation -eye pain or itching -headache -nausea or vomiting -stomach gas or fullness This list may not  describe all possible side effects. Call your doctor for medical advice about side effects. You may report side effects to FDA at 1-800-FDA-1088. Where should I keep my medicine? Keep out of the reach of children. Store at room temperature of 15 and 30 degrees C (59 and 86 degrees F). Throw away any unused medicine after the expiration date. NOTE: This sheet is a summary. It may not cover all possible information. If you have questions about this medicine, talk to your doctor, pharmacist, or health care provider.  2018 Elsevier/Gold Standard (2011-06-03 09:02:42)  Fall Prevention in the Home Falls can cause injuries. They can happen to people of all ages. There are many things you can do to make your home safe and to help prevent falls. What can I do on the outside of my home?  Regularly fix the edges of walkways and driveways and fix any cracks.  Remove anything that might make you trip as you walk through a door, such as a raised step or threshold.  Trim any bushes or trees on the path to your home.  Use bright outdoor lighting.  Clear any walking paths of anything that might make someone trip, such  as rocks or tools.  Regularly check to see if handrails are loose or broken. Make sure that both sides of any steps have handrails.  Any raised decks and porches should have guardrails on the edges.  Have any leaves, snow, or ice cleared regularly.  Use sand or salt on walking paths during winter.  Clean up any spills in your garage right away. This includes oil or grease spills. What can I do in the bathroom?  Use night lights.  Install grab bars by the toilet and in the tub and shower. Do not use towel bars as grab bars.  Use non-skid mats or decals in the tub or shower.  If you need to sit down in the shower, use a plastic, non-slip stool.  Keep the floor dry. Clean up any water that spills on the floor as soon as it happens.  Remove soap buildup in the tub or shower  regularly.  Attach bath mats securely with double-sided non-slip rug tape.  Do not have throw rugs and other things on the floor that can make you trip. What can I do in the bedroom?  Use night lights.  Make sure that you have a light by your bed that is easy to reach.  Do not use any sheets or blankets that are too big for your bed. They should not hang down onto the floor.  Have a firm chair that has side arms. You can use this for support while you get dressed.  Do not have throw rugs and other things on the floor that can make you trip. What can I do in the kitchen?  Clean up any spills right away.  Avoid walking on wet floors.  Keep items that you use a lot in easy-to-reach places.  If you need to reach something above you, use a strong step stool that has a grab bar.  Keep electrical cords out of the way.  Do not use floor polish or wax that makes floors slippery. If you must use wax, use non-skid floor wax.  Do not have throw rugs and other things on the floor that can make you trip. What can I do with my stairs?  Do not leave any items on the stairs.  Make sure that there are handrails on both sides of the stairs and use them. Fix handrails that are broken or loose. Make sure that handrails are as long as the stairways.  Check any carpeting to make sure that it is firmly attached to the stairs. Fix any carpet that is loose or worn.  Avoid having throw rugs at the top or bottom of the stairs. If you do have throw rugs, attach them to the floor with carpet tape.  Make sure that you have a light switch at the top of the stairs and the bottom of the stairs. If you do not have them, ask someone to add them for you. What else can I do to help prevent falls?  Wear shoes that: ? Do not have high heels. ? Have rubber bottoms. ? Are comfortable and fit you well. ? Are closed at the toe. Do not wear sandals.  If you use a stepladder: ? Make sure that it is fully  opened. Do not climb a closed stepladder. ? Make sure that both sides of the stepladder are locked into place. ? Ask someone to hold it for you, if possible.  Clearly mark and make sure that you can see: ? Any grab bars  or handrails. ? First and last steps. ? Where the edge of each step is.  Use tools that help you move around (mobility aids) if they are needed. These include: ? Canes. ? Walkers. ? Scooters. ? Crutches.  Turn on the lights when you go into a dark area. Replace any light bulbs as soon as they burn out.  Set up your furniture so you have a clear path. Avoid moving your furniture around.  If any of your floors are uneven, fix them.  If there are any pets around you, be aware of where they are.  Review your medicines with your doctor. Some medicines can make you feel dizzy. This can increase your chance of falling. Ask your doctor what other things that you can do to help prevent falls. This information is not intended to replace advice given to you by your health care provider. Make sure you discuss any questions you have with your health care provider. Document Released: 05/15/2009 Document Revised: 12/25/2015 Document Reviewed: 08/23/2014 Elsevier Interactive Patient Education  2018 [employer].  Spend 15-20 minutes outside each day to get some vitamin D

## 2018-03-06 NOTE — Assessment & Plan Note (Signed)
Last lipids reviewed; continue same regimen; watch diet

## 2018-04-04 ENCOUNTER — Encounter: Payer: Self-pay | Admitting: Nurse Practitioner

## 2018-04-04 ENCOUNTER — Ambulatory Visit: Payer: Self-pay | Admitting: Family Medicine

## 2018-04-04 ENCOUNTER — Ambulatory Visit (INDEPENDENT_AMBULATORY_CARE_PROVIDER_SITE_OTHER): Payer: Medicare HMO | Admitting: Nurse Practitioner

## 2018-04-04 VITALS — BP 130/70 | HR 60 | Temp 97.6°F | Resp 16 | Ht 62.0 in | Wt 124.9 lb

## 2018-04-04 DIAGNOSIS — L304 Erythema intertrigo: Secondary | ICD-10-CM | POA: Diagnosis not present

## 2018-04-04 DIAGNOSIS — L309 Dermatitis, unspecified: Secondary | ICD-10-CM | POA: Diagnosis not present

## 2018-04-04 MED ORDER — KETOCONAZOLE 2 % EX CREA: 1.0000 "application " | TOPICAL_CREAM | Freq: Every day | CUTANEOUS | 0 refills | Status: DC

## 2018-04-04 MED ORDER — LORATADINE 10 MG PO TABS: 10.0000 mg | ORAL_TABLET | Freq: Every day | ORAL | 0 refills | Status: DC

## 2018-04-04 NOTE — Progress Notes (Signed)
Name: Kiara Brady   MRN: [id number]    DOB: 06/08/1946   Date:04/04/2018       Progress Note  Subjective  Chief Complaint  Chief Complaint  Patient presents with  . Allergic Reaction    rash from Fosamax    HPI  Patient presents with red pruritic rash on folds of abdomen, arms and legs started last week. She has been treating it with warm washes and alcohol and antibiotic hand wipes. Believes it is related to taking second dose of fosmax.   Patient has dog is not on flea or tick medicines, states dog only itches when they go to the park   Patient Active Problem List   Diagnosis Date Noted  . Osteoporosis 02/06/2018  . 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 Degenerative arthritis of left knee 07/04/2017  . Hyperlipidemia 03/23/2016  . Cardiac murmur, previously undiagnosed 01/23/2016  . Heart murmur 09/03/2015  . Left lateral knee pain 06/30/2015  . Pain of left knee and lower leg 06/10/2015  . Dyslipidemia associated with type 2 diabetes mellitus (HCC) 02/26/2015  . Diabetes mellitus, type 2 (HCC) 01/06/2015    Past Medical History:  Diagnosis Date  . Diabetes mellitus without complication (HCC)   . Hyperlipidemia   . Osteopenia of neck of femur 12/19/2017  . Osteoporosis 02/06/2018   July 2019    Past Surgical History:  Procedure Laterality Date  . VARICOSE VEIN SURGERY      Social History   Tobacco Use  . Smoking status: Former Games developer  . Smokeless tobacco: Never Used  Substance Use Topics  . Alcohol use: No     Current Outpatient Medications:  .  ACCU-CHEK AVIVA PLUS test strip, , Disp: , Rfl:  .  ACCU-CHEK SOFTCLIX LANCETS lancets, , Disp: , Rfl:  .  alendronate (FOSAMAX) 70 MG tablet, Take 1 tablet (70 mg total) by mouth every 7 (seven) days. Take with a full glass of water on an empty stomach., Disp: 4 tablet, Rfl: 11 .  atorvastatin (LIPITOR) 10 MG tablet, TAKE 1 TABLET BY MOUTH ONCE DAILY AT  6PM, Disp: 90 tablet, Rfl: 0 .   ibuprofen (ADVIL,MOTRIN) 200 MG tablet, Take 200 mg by mouth every 6 (six) hours as needed., Disp: , Rfl:  .  metFORMIN (GLUCOPHAGE-XR) 500 MG 24 hr tablet, TAKE 1 TABLET BY MOUTH DAILY, Disp: 90 tablet, Rfl: 0  Allergies  Allergen Reactions  . Oxycodone Other (See Comments)  . Penicillin G Hives and Shortness Of Breath  . Penicillins   . Prednisone Cough  . Xanax [Alprazolam]     hyperactivity    Review of Systems  Constitutional: Negative for chills and fever.  Eyes: Negative for blurred vision and double vision.  Respiratory: Negative for shortness of breath.   Cardiovascular: Negative for chest pain and palpitations.  Neurological: Negative for dizziness and headaches.     No other specific complaints in a complete review of systems (except as listed in HPI above).  Objective  Vitals:   04/04/18 1306  BP: 130/70  Pulse: 60  Resp: 16  Temp: 97.6 F (36.4 C)  TempSrc: Oral  SpO2: 98%  Weight: 124 lb 14.4 oz (56.7 kg)  Height: 5\' 2"  (1.575 m)     Body mass index is 22.84 kg/m.  Nursing Note and Vital Signs reviewed.  Physical Exam  Constitutional: She is oriented to person, place, and time. She appears well-developed.  Cardiovascular: Normal rate and regular rhythm.  Pulmonary/Chest: Effort normal and breath sounds normal.  Musculoskeletal: Normal range of motion.  Neurological: She is alert and oriented to person, place, and time.  Skin: Skin is warm and dry. Rash noted.     Psychiatric: She has a normal mood and affect. Her behavior is normal. Judgment and thought content normal.       No results found for this or any previous visit (from the past 48 hour(s)).  Assessment & Plan 1. Intertrigo  I want you to keep this area clean and dry. After showering use the prescription antifungal cream on the red areas. You can use this until that area is no longer red. Afterwards make sure to continue to keep area clean and dry so it does not come back, you can  use drying powder and wear cotton underwear to absorb sweat - ketoconazole (NIZORAL) 2 % cream; Apply 1 application topically daily.  Dispense: 15 g; Refill: 0  2. Dermatitis - I want you to keep skin clean, You can use cold compresses to help with itching. You want to avoid itching skin. I am sending you an oral anti-histamine pill to help reduce itching; please start with half a tablet and can take the other half if still needed. Use the OTC anti-itch cream for specific areas of itching  - You can stop taking the alendronate or fosamax for 2 weeks. We will see you back in 2-4 weeks to ensure your skin condition is improved and re-trial your fosamax. Does not appear to be drug reaction - loratadine (CLARITIN) 10 MG tablet; Take 1 tablet (10 mg total) by mouth daily.  Dispense: 30 tablet; Refill: 0

## 2018-04-04 NOTE — Telephone Encounter (Signed)
Pt called with C/O severe itching for 2 weeks after taking a new medication for osteopenia. Pt describes a blistered rash over arms, legs, and folds of her abdomin.  Pt has been treating with warm washes and alcohol. Pt denies swelling of lips,tongue or throat. Denies any other symptoms. Care advice given per protocol. Appointment for today per protocol.  Reason for Disposition . SEVERE itching (i.e., interferes with sleep, normal activities or school)  Answer Assessment - Initial Assessment Questions 1. APPEARANCE of RASH: "Describe the rash." (e.g., spots, blisters, raised areas, skin peeling, scaly)     Blisters spots 2. SIZE: "How big are the spots?" (e.g., tip of pen, eraser, coin; inches, centimeters)     Dots  3. LOCATION: "Where is the rash located?"     Folds of abdomin, arms, legs 4. COLOR: "What color is the rash?" (Note: It is difficult to assess rash color in people with darker-colored skin. When this situation occurs, simply ask the caller to describe what they see.)     Dark pink 5. ONSET: "When did the rash begin?"     Last week after the second dose of medicine 6. FEVER: "Do you have a fever?" If so, ask: "What is your temperature, how was it measured, and when did it start?"     no 7. ITCHING: "Does the rash itch?" If so, ask: "How bad is the itch?" (Scale 1-10; or mild, moderate, severe)     Severe 9 but 3 when washed 8. CAUSE: "What do you think is causing the rash?"     Medication for osteopenia. 9. MEDICATION FACTORS: "Have you started any new medications within the last 2 weeks?" (e.g., antibiotics)      yes 10. OTHER SYMPTOMS: "Do you have any other symptoms?" (e.g., dizziness, headache, sore throat, joint pain)       no  Protocols used: RASH OR REDNESS - [facility]

## 2018-04-04 NOTE — Telephone Encounter (Signed)
Documentation reviewed. Will see patient in office today.

## 2018-04-04 NOTE — Telephone Encounter (Signed)
Please advise 

## 2018-04-04 NOTE — Patient Instructions (Addendum)
For Abdominal Fold: - I want you to keep this area clean and dry. After showering use the prescription antifungal cream on the red areas. You can use this until that area is no longer red. Afterwards make sure to continue to keep area clean and dry so it does not come back, you can use drying powder and wear cotton underwear to absorb sweat  For itching and skin on arms and legs - I want you to keep skin clean, You can use cold compresses to help with itching. You want to avoid itching skin. I am sending you an oral anti-histamine pill to help reduce itching; please start with half a tablet and can take the other half if still needed. Use the OTC anti-itch cream for specific areas of itching  - You can stop taking the alendronate or fosamax for 2 weeks. We will see you back in 2-4 weeks to ensure your skin condition is improved and re-trial your fosamax.    Intertrigo Intertrigo is skin irritation (inflammation) that happens in warm, moist areas of the body. The irritation can cause a rash and make skin raw and itchy. The rash is usually pink or red. It happens mostly between folds of skin or where skin rubs together, such as:  Toes.  Armpits.  Groin.  Belly.  Breasts.  Buttocks.  This condition is not passed from person to person (is not contagious). Follow these instructions at home:  Keep the affected area clean and dry.  Do not scratch your skin.  Stay cool as much as possible. Use an air conditioner or fan, if you can.  Apply over-the-counter and prescription medicines only as told by your doctor.  If you were prescribed an antibiotic medicine, use it as told by your doctor. Do not stop using the antibiotic even if your condition starts to get better.  Keep all follow-up visits as told by your doctor. This is important. How is this prevented?  Stay at a healthy weight.  Keep your feet dry. This is very important if you have diabetes. Wear cotton or wool socks.  Take  care of and protect the skin in your groin and butt area as told by your doctor.  Do not wear tight clothes. Wear clothes that: ? Are loose. ? Take away moisture from your body.  ? Are made of cotton.  Wear a bra that gives good support, if needed.  Shower and dry yourself fully after being active.  Keep your blood sugar under control if you have diabetes. Contact a doctor if:  Your symptoms do not get better with treatment.  Your symptoms get worse or they spread.  You notice more redness and warmth.  You have a fever. This information is not intended to replace advice given to you by your health care provider. Make sure you discuss any questions you have with your health care provider. Document Released: 08/21/2010 Document Revised: 12/25/2015 Document Reviewed: 01/20/2015 Elsevier Interactive Patient Education  2018 [employer]   Contact Dermatitis Dermatitis is redness, soreness, and swelling (inflammation) of the skin. Contact dermatitis is a reaction to certain substances that touch the skin. You either touched something that irritated your skin, or you have allergies to something you touched. Follow these instructions at home: Skin Care  Moisturize your skin as needed.  Apply cool compresses to the affected areas.  Try taking a bath with: ? Epsom salts. Follow the instructions on the package. You can get these at a pharmacy or grocery  store. ? Baking soda. Pour a small amount into the bath as told by your doctor. ? Colloidal oatmeal. Follow the instructions on the package. You can get this at a pharmacy or grocery store.  Try applying baking soda paste to your skin. Stir water into baking soda until it looks like paste.  Do not scratch your skin.  Bathe less often.  Bathe in lukewarm water. Avoid using hot water. Medicines  Take or apply over-the-counter and prescription medicines only as told by your doctor.  If you were prescribed an antibiotic medicine,  take or apply your antibiotic as told by your doctor. Do not stop taking the antibiotic even if your condition starts to get better. General instructions  Keep all follow-up visits as told by your doctor. This is important.  Avoid the substance that caused your reaction. If you do not know what caused it, keep a journal to try to track what caused it. Write down: ? What you eat. ? What cosmetic products you use. ? What you drink. ? What you wear in the affected area. This includes jewelry.  If you were given a bandage (dressing), take care of it as told by your doctor. This includes when to change and remove it. Contact a doctor if:  You do not get better with treatment.  Your condition gets worse.  You have signs of infection such as: ? Swelling. ? Tenderness. ? Redness. ? Soreness. ? Warmth.  You have a fever.  You have new symptoms. Get help right away if:  You have a very bad headache.  You have neck pain.  Your neck is stiff.  You throw up (vomit).  You feel very sleepy.  You see red streaks coming from the affected area.  Your bone or joint underneath the affected area becomes painful after the skin has healed.  The affected area turns darker.  You have trouble breathing. This information is not intended to replace advice given to you by your health care provider. Make sure you discuss any questions you have with your health care provider. Document Released: 05/16/2009 Document Revised: 12/25/2015 Document Reviewed: 12/04/2014 Elsevier Interactive Patient Education  2018 [employer].

## 2018-04-24 ENCOUNTER — Ambulatory Visit (INDEPENDENT_AMBULATORY_CARE_PROVIDER_SITE_OTHER): Payer: Medicare HMO | Admitting: Family Medicine

## 2018-04-24 ENCOUNTER — Encounter: Payer: Self-pay | Admitting: Family Medicine

## 2018-04-24 ENCOUNTER — Telehealth: Payer: Self-pay | Admitting: Family Medicine

## 2018-04-24 VITALS — BP 120/60 | HR 76 | Temp 98.4°F | Resp 14 | Ht 62.0 in | Wt 124.0 lb

## 2018-04-24 DIAGNOSIS — L309 Dermatitis, unspecified: Secondary | ICD-10-CM

## 2018-04-24 DIAGNOSIS — M810 Age-related osteoporosis without current pathological fracture: Secondary | ICD-10-CM

## 2018-04-24 DIAGNOSIS — J069 Acute upper respiratory infection, unspecified: Secondary | ICD-10-CM

## 2018-04-24 DIAGNOSIS — M85859 Other specified disorders of bone density and structure, unspecified thigh: Secondary | ICD-10-CM

## 2018-04-24 MED ORDER — MAGIC MOUTHWASH W/LIDOCAINE: 5.0000 mL | Freq: Three times a day (TID) | ORAL | 0 refills | Status: DC | PRN

## 2018-04-24 MED ORDER — BENZONATATE 100 MG PO CAPS: 100.0000 mg | ORAL_CAPSULE | Freq: Two times a day (BID) | ORAL | 0 refills | Status: DC | PRN

## 2018-04-24 NOTE — Telephone Encounter (Signed)
-----   Message from Doren CustardEmily E Boyce, FNP sent at 04/24/2018  2:29 PM EDT ----- Regarding: Osteoporosis Medication Management HI Dr. Sherie DonLada,  I saw [name] today and she stated she gets nauseated with Fossamax and does not want to restart. I wanted to defer to you for treatment.   Thank you! Kiara BurtonEmily

## 2018-04-24 NOTE — Patient Instructions (Addendum)
Upper Respiratory Infection, Adult Most upper respiratory infections (URIs) are caused by a virus. A URI affects the nose, throat, and upper air passages. The most common type of URI is often called "the common cold." Follow these instructions at home:  Take medicines only as told by your doctor.  Gargle warm saltwater or take cough drops to comfort your throat as told by your doctor.  Use a warm mist humidifier or inhale steam from a shower to increase air moisture. This may make it easier to breathe.  Drink enough fluid to keep your pee (urine) clear or pale yellow.  Eat soups and other clear broths.  Have a healthy diet.  Rest as needed.  Go back to work when your fever is gone or your doctor says it is okay. ? You may need to stay home longer to avoid giving your URI to others. ? You can also wear a face mask and wash your hands often to prevent spread of the virus.  Use your inhaler more if you have asthma.  Do not use any tobacco products, including cigarettes, chewing tobacco, or electronic cigarettes. If you need help quitting, ask your doctor. Contact a doctor if:  You are getting worse, not better.  Your symptoms are not helped by medicine.  You have chills.  You are getting more short of breath.  You have brown or red mucus.  You have yellow or brown discharge from your nose.  You have pain in your face, especially when you bend forward.  You have a fever.  You have puffy (swollen) neck glands.  You have pain while swallowing.  You have white areas in the back of your throat. Get help right away if:  You have very bad or constant: ? Headache. ? Ear pain. ? Pain in your forehead, behind your eyes, and over your cheekbones (sinus pain). ? Chest pain.  You have long-lasting (chronic) lung disease and any of the following: ? Wheezing. ? Long-lasting cough. ? Coughing up blood. ? A change in your usual mucus.  You have a stiff neck.  You have  changes in your: ? Vision. ? Hearing. ? Thinking. ? Mood. This information is not intended to replace advice given to you by your health care provider. Make sure you discuss any questions you have with your health care provider. Document Released: 01/05/2008 Document Revised: 03/21/2016 Document Reviewed: 10/24/2013 Elsevier Interactive Patient Education  2018 Elsevier Inc.  Cool Mist Vaporizer A cool mist vaporizer is a device that releases a cool mist into the air. If you have a cough or a cold, using a vaporizer may help relieve your symptoms. The mist adds moisture to the air, which may help thin your mucus and make it less sticky. When your mucus is thin and less sticky, it easier for you to breathe and to cough up secretions. Do not use a vaporizer if you are allergic to mold. Follow these instructions at home:  Follow the instructions that come with the vaporizer.  Do not use anything other than distilled water in the vaporizer.  Do not run the vaporizer all of the time. Doing that can cause mold or bacteria to grow in the vaporizer.  Clean the vaporizer after each time that you use it.  Clean and dry the vaporizer well before storing it.  Stop using the vaporizer if your breathing symptoms get worse. This information is not intended to replace advice given to you by your health care provider. Make sure   you discuss any questions you have with your health care provider. Document Released: 04/15/2004 Document Revised: 02/06/2016 Document Reviewed: 10/18/2015 Elsevier Interactive Patient Education  2018 Elsevier Inc.   

## 2018-04-24 NOTE — Telephone Encounter (Signed)
I added fosamax to adverse side effects profile and will contact her tomorrow

## 2018-04-24 NOTE — Progress Notes (Signed)
Name: Kiara Brady   MRN: [id number]    DOB: 08/11/1945   Date:04/24/2018       Progress Note  Subjective  Chief Complaint  Chief Complaint  Patient presents with  . URI    cough, congested, scratchy throat  . Follow-up    HPI  Rash Follow Up: she saw Lanora ManisElizabeth NP for rash.  The rash has completely resolved on arms and groin.  She has stopped the Fossamax, and does not want to restart at this time because it makes her sick on her stomach.   URI: Started about 1 week ago, nasal congestion (resolving), and sore throat (resolving), and now chest congestion over the last few days.  Does endorse some chills without fever. Has had coughing since beginning of illness, cough is productive with white phlegm. Denies ear pain/pressure, sinus pain/pressure, chest pain, or shortness of breath.  Patient Active Problem List   Diagnosis Date Noted  . Osteoporosis 02/06/2018  . 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 Degenerative arthritis of left knee 07/04/2017  . Hyperlipidemia 03/23/2016  . Cardiac murmur, previously undiagnosed 01/23/2016  . Heart murmur 09/03/2015  . Left lateral knee pain 06/30/2015  . Pain of left knee and lower leg 06/10/2015  . Dyslipidemia associated with type 2 diabetes mellitus (HCC) 02/26/2015  . Diabetes mellitus, type 2 (HCC) 01/06/2015    Social History   Tobacco Use  . Smoking status: Former Games developermoker  . Smokeless tobacco: Never Used  Substance Use Topics  . Alcohol use: No     Current Outpatient Medications:  .  ACCU-CHEK AVIVA PLUS test strip, , Disp: , Rfl:  .  ACCU-CHEK SOFTCLIX LANCETS lancets, , Disp: , Rfl:  .  alendronate (FOSAMAX) 70 MG tablet, Take 1 tablet (70 mg total) by mouth every 7 (seven) days. Take with a full glass of water on an empty stomach., Disp: 4 tablet, Rfl: 11 .  atorvastatin (LIPITOR) 10 MG tablet, TAKE 1 TABLET BY MOUTH ONCE DAILY AT  6PM, Disp: 90 tablet, Rfl: 0 .  ibuprofen (ADVIL,MOTRIN) 200  MG tablet, Take 200 mg by mouth every 6 (six) hours as needed., Disp: , Rfl:  .  ketoconazole (NIZORAL) 2 % cream, Apply 1 application topically daily., Disp: 15 g, Rfl: 0 .  loratadine (CLARITIN) 10 MG tablet, Take 1 tablet (10 mg total) by mouth daily., Disp: 30 tablet, Rfl: 0 .  metFORMIN (GLUCOPHAGE-XR) 500 MG 24 hr tablet, TAKE 1 TABLET BY MOUTH DAILY, Disp: 90 tablet, Rfl: 0  Allergies  Allergen Reactions  . Oxycodone Other (See Comments)  . Penicillin G Hives and Shortness Of Breath  . Penicillins   . Prednisone Cough  . Xanax [Alprazolam]     hyperactivity    I personally reviewed active problem list, medication list, allergies, notes from last encounter with the patient/caregiver today.  ROS  Ten systems reviewed and is negative except as mentioned in HPI.  Objective  Vitals:   04/24/18 1412  BP: 120/60  Pulse: 76  Resp: 14  Temp: 98.4 F (36.9 C)  TempSrc: Oral  SpO2: 98%  Weight: 124 lb (56.2 kg)  Height: 5\' 2"  (1.575 m)    Body mass index is 22.68 kg/m.  Nursing Note and Vital Signs reviewed.  Physical Exam  Constitutional: Patient appears well-developed and well-nourished.  No distress.  HEENT: head atraumatic, normocephalic, pupils equal and reactive to light, Bilateral TM's without erythema or effusion, bilateral maxillary and frontal sinuses are  non-tender, neck supple without lymphadenopathy, throat within normal limits - no erythema or exudate, no tonsillar swelling Cardiovascular: Normal rate, regular rhythm and normal heart sounds.  No murmur heard. No BLE edema. Pulmonary/Chest: Effort normal and breath sounds clear bilaterally. No respiratory distress. Does have dry cough during examination. Skin - no rash is present. Psychiatric: Patient has a normal mood and affect. behavior is normal. Judgment and thought content normal.   No results found for this or any previous visit (from the past 72 hour(s)).  Assessment & Plan  1. Upper respiratory  tract infection, unspecified type - Advised does not appear to be bacterial in nature; follow up if not improving. - benzonatate (TESSALON) 100 MG capsule; Take 1 capsule (100 mg total) by mouth 2 (two) times daily as needed for cough.  Dispense: 20 capsule; Refill: 0 - magic mouthwash w/lidocaine SOLN; Take 5 mLs by mouth 3 (three) times daily as needed for mouth pain.  Dispense: 100 mL; Refill: 0  2. Age-related osteoporosis without current pathological fracture - She does not want to restart fossamax today as she feels it caused nausea.  Message is sent to PCP Dr. Sherie Don to address alternative therapy at follow up appointment.  3. Dermatitis Resolved.  -Red flags and when to present for emergency care or RTC including fever >101.25F, chest pain, shortness of breath, new/worsening/un-resolving symptoms,  reviewed with patient at time of visit. Follow up and care instructions discussed and provided in AVS.

## 2018-04-25 ENCOUNTER — Telehealth: Payer: Self-pay | Admitting: Family Medicine

## 2018-04-25 NOTE — Telephone Encounter (Signed)
1/3 - lidocaine, maalox, benadryl

## 2018-04-25 NOTE — Telephone Encounter (Signed)
Please let the patient know that we're sorry to hear that she is not tolerating Fosamax Please do not take that any more REFER to endocrinology for osteoporosis, because there are a few more options available that they can discuss with her and may be able to provide injections just twice a year or some other treatment to help her prevent further bone loss and hopefully strengthen her bones Thank you

## 2018-04-25 NOTE — Telephone Encounter (Signed)
Copied from CRM [id number]. Topic: General - Other >> Apr 25, 2018 12:14 PM Tamela OddiHarris, Lachanda Buczek J wrote: Reason for CRM: [facility] formula for the patient's prescription for magic mouthwash w/lidocaine SOLN before it can be filled.  Please advise.  CB# [phone].

## 2018-04-26 NOTE — Telephone Encounter (Signed)
[facility] notified.

## 2018-04-26 NOTE — Telephone Encounter (Signed)
Left detailed voicemail

## 2018-04-28 NOTE — Telephone Encounter (Signed)
Did you REFER to endo?

## 2018-04-28 NOTE — Telephone Encounter (Signed)
Referral entered  

## 2018-05-04 ENCOUNTER — Ambulatory Visit: Payer: Medicare HMO | Admitting: Family Medicine

## 2018-06-06 ENCOUNTER — Telehealth: Payer: Self-pay | Admitting: Family Medicine

## 2018-06-06 DIAGNOSIS — E119 Type 2 diabetes mellitus without complications: Secondary | ICD-10-CM

## 2018-06-06 DIAGNOSIS — E1169 Type 2 diabetes mellitus with other specified complication: Secondary | ICD-10-CM

## 2018-06-06 DIAGNOSIS — E785 Hyperlipidemia, unspecified: Secondary | ICD-10-CM

## 2018-06-06 MED ORDER — ATORVASTATIN CALCIUM 10 MG PO TABS: ORAL_TABLET | ORAL | 0 refills | Status: DC

## 2018-06-06 MED ORDER — METFORMIN HCL ER 500 MG PO TB24: 500.0000 mg | ORAL_TABLET | Freq: Every day | ORAL | 0 refills | Status: DC

## 2018-06-06 NOTE — Telephone Encounter (Signed)
Copied from CRM [id number]. Topic: Quick Communication - Rx Refill/Question >> Jun 06, 2018  9:47 AM Crist Infante wrote: Medication: atorvastatin (LIPITOR) 10 MG tablet metFORMIN (GLUCOPHAGE-XR) 500 MG 24 hr tablet Pt declined to call the pharmacy, she said they never send the request  [facility] [address] [place] - [address] [phone] (Phone) [phone] (Fax)

## 2018-06-06 NOTE — Telephone Encounter (Signed)
appt coming up later this month Last lipids, sgpt, cr reviewed rxs approved

## 2018-06-09 DIAGNOSIS — R69 Illness, unspecified: Secondary | ICD-10-CM | POA: Diagnosis not present

## 2018-06-22 ENCOUNTER — Encounter: Payer: Self-pay | Admitting: Family Medicine

## 2018-06-22 ENCOUNTER — Ambulatory Visit (INDEPENDENT_AMBULATORY_CARE_PROVIDER_SITE_OTHER): Payer: Medicare HMO | Admitting: Family Medicine

## 2018-06-22 VITALS — BP 130/82 | HR 94 | Temp 98.0°F | Ht 62.0 in | Wt 120.1 lb

## 2018-06-22 DIAGNOSIS — Z5181 Encounter for therapeutic drug level monitoring: Secondary | ICD-10-CM | POA: Diagnosis not present

## 2018-06-22 DIAGNOSIS — E1169 Type 2 diabetes mellitus with other specified complication: Secondary | ICD-10-CM | POA: Diagnosis not present

## 2018-06-22 DIAGNOSIS — E119 Type 2 diabetes mellitus without complications: Secondary | ICD-10-CM

## 2018-06-22 DIAGNOSIS — E785 Hyperlipidemia, unspecified: Secondary | ICD-10-CM | POA: Diagnosis not present

## 2018-06-22 DIAGNOSIS — M1712 Unilateral primary osteoarthritis, left knee: Secondary | ICD-10-CM | POA: Diagnosis not present

## 2018-06-22 DIAGNOSIS — E782 Mixed hyperlipidemia: Secondary | ICD-10-CM

## 2018-06-22 DIAGNOSIS — E876 Hypokalemia: Secondary | ICD-10-CM

## 2018-06-22 MED ORDER — ASPIRIN EC 81 MG PO TBEC: 81.0000 mg | DELAYED_RELEASE_TABLET | Freq: Every day | ORAL | Status: DC

## 2018-06-22 NOTE — Progress Notes (Signed)
BP 130/82   Pulse 94   Temp 98 F (36.7 C) (Oral)   Ht 5\' 2"  (1.575 m)   Wt 120 lb 1.6 oz (54.5 kg)   SpO2 96%   BMI 21.97 kg/m    Subjective:    Patient ID: Kiara HerterAdelina M Sadek, female    DOB: 02/23/1946, 72 y.o.   MRN: [id number]  HPI: Kiara Brady is a 72 y.o. female  Chief Complaint  Patient presents with  . Follow-up    HPI Patient is here for f/u  Type 2 diabetes She is hoping the numbers will be better; she has not really been eating better Not checking FSBS; she does not want to check her fingerstick sugars any more; she stopped that a long time ago anyway; last eye exam was in June; no damage from diabetes  Lab Results  Component Value Date   HGBA1C 5.9 (H) 06/22/2018   She has arthritis in her left knee; uses hot towel or hot water which helps; also uses dollar tree rub; left knee is worse than the right knee  Hypokalemia; eating vegetables; not many fruits; sometimes get cramps in her toes  High cholesterol; not eating many fatty pig meats; likes salmon and chicken  She does not take an aspirin daily; she says that some medicine affected her; not aspirin; aspirin is supposed to be good she says  The 10-year ASCVD risk score Denman George(Goff DC Montez HagemanJr., et al., 2013) is: 23%   Values used to calculate the score:     Age: [age] years     Sex: Female     Is Non-Hispanic African American: No     Diabetic: Yes     Tobacco smoker: No     Systolic Blood Pressure: 130 mmHg     Is BP treated: No     HDL Cholesterol: 70 mg/dL     Total Cholesterol: 154 mg/dL   [facility] screen [facility] 2/9 06/22/2018 04/24/2018 03/06/2018 12/19/2017 08/18/2017  Decreased Interest 0 0 0 0 0  Down, Depressed, Hopeless 0 0 0 0 0  PHQ - 2 Score 0 0 0 0 0  Altered sleeping 0 0 - - -  Tired, decreased energy 0 0 - - -  Change in appetite 0 0 - - -  Feeling bad or failure about yourself  0 0 - - -  Trouble concentrating 0 0 - - -  Moving slowly or fidgety/restless 0 0 - - -  Suicidal thoughts 0 0 - - -   PHQ-9 Score 0 0 - - -  Difficult doing work/chores Not difficult at all Not difficult at all - - -   Fall Risk  06/22/2018 04/24/2018 04/04/2018 03/06/2018 12/19/2017  Falls in the past year? 0 No No No No  Number falls in past yr: 0 - - - -    Relevant past medical, surgical, family and social history reviewed Past Medical History:  Diagnosis Date  . Diabetes mellitus without complication (HCC)   . Hyperlipidemia   . Osteopenia of neck of femur 12/19/2017  . Osteoporosis 02/06/2018   July 2019   Past Surgical History:  Procedure Laterality Date  . VARICOSE VEIN SURGERY     Family History  Problem Relation Age of Onset  . Heart disease Mother   . Alzheimer's disease Mother   . Alcohol abuse Father   . Cancer Maternal Grandmother        breast  . Breast cancer Maternal Grandmother    Social  History   Tobacco Use  . Smoking status: Former Games developer  . Smokeless tobacco: Never Used  Substance Use Topics  . Alcohol use: No  . Drug use: No     [facility] Visit from 06/22/2018 in [facility]  AUDIT-C Score  0    MD note: quit smoking in 2002  Interim medical history since last visit reviewed. Allergies and medications reviewed  Review of Systems Per HPI unless specifically indicated above      Objective:    BP 130/82   Pulse 94   Temp 98 F (36.7 C) (Oral)   Ht 5\' 2"  (1.575 m)   Wt 120 lb 1.6 oz (54.5 kg)   SpO2 96%   BMI 21.97 kg/m   Wt Readings from Last 3 Encounters:  06/22/18 120 lb 1.6 oz (54.5 kg)  04/24/18 124 lb (56.2 kg)  04/04/18 124 lb 14.4 oz (56.7 kg)    Physical Exam  Constitutional: She appears well-developed and well-nourished. No distress.  HENT:  Head: Normocephalic and atraumatic.  Eyes: EOM are normal. No scleral icterus.  Neck: No thyromegaly present.  Cardiovascular: Normal rate, regular rhythm and normal heart sounds.  No murmur heard. Pulmonary/Chest: Effort normal and breath sounds normal. No respiratory distress. She  has no wheezes.  Abdominal: Soft. Bowel sounds are normal. She exhibits no distension.  Musculoskeletal: She exhibits no edema.  Neurological: She is alert.  Skin: Skin is warm and dry. She is not diaphoretic. No pallor.  Psychiatric: She has a normal mood and affect. Her behavior is normal. Judgment and thought content normal.   Diabetic Foot Form - Detailed   Diabetic Foot Exam - detailed Diabetic Foot exam was performed with the following findings:  Yes 06/22/2018  4:28 PM  Visual Foot Exam completed.:  Yes  Pulse Foot Exam completed.:  Yes  Right Dorsalis Pedis:  Present Left Dorsalis Pedis:  Present  Sensory Foot Exam Completed.:  Yes Semmes-Weinstein Monofilament Test R Site 1-Great Toe:  Pos L Site 1-Great Toe:  Pos           Assessment & Plan:   Problem List Items Addressed This Visit      Endocrine   Diabetes mellitus, type 2 (HCC) - Primary   Relevant Medications   aspirin EC 81 MG tablet   Other Relevant Orders   Hemoglobin A1C (Completed)   Dyslipidemia associated with type 2 diabetes mellitus (HCC)    Foot exam by MD today; check a1c every 6 months while controlled; urine micro:alb yearly; eye exam yearly      Relevant Medications   aspirin EC 81 MG tablet   Other Relevant Orders   Lipid panel (Completed)     Musculoskeletal and Integument   Degenerative arthritis of left knee    Supportive care; limit NSAIDs; may try turmeric, tylenol, topicals      Relevant Medications   aspirin EC 81 MG tablet     Other   Hyperlipidemia    Goal LDL less than 70      Relevant Medications   aspirin EC 81 MG tablet   Other Relevant Orders   Lipid panel (Completed)    Other Visit Diagnoses    Medication monitoring encounter       Relevant Orders   COMPLETE METABOLIC PANEL WITH GFR (Completed)   Hypokalemia       Relevant Orders   Magnesium (Completed)       Follow up plan: Return in about 6 months (around  12/21/2018) for follow-up visit with Dr. Sherie Don;  Medicare Wellness visit with Rosanne Sack soon.  An after-visit summary was printed and given to the patient at check-out.  Please see the patient instructions which may contain other information and recommendations beyond what is mentioned above in the assessment and plan.  Meds ordered this encounter  Medications  . aspirin EC 81 MG tablet    Sig: Take 1 tablet (81 mg total) by mouth daily.    Orders Placed This Encounter  Procedures  . Hemoglobin A1C  . Lipid panel  . COMPLETE METABOLIC PANEL WITH GFR  . Magnesium

## 2018-06-22 NOTE — Patient Instructions (Signed)
I do recommend a daily aspirin, unless you are allergic to it  Try to limit saturated fats in your diet (bologna, hot dogs, barbeque, cheeseburgers, hamburgers, steak, bacon, sausage, cheese, etc.) and get more fresh fruits, vegetables, and whole grains  Please do see your eye doctor regularly, and have your eyes examined every year (or more often per his or her recommendation) Check your feet every night and let me know right away of any sores, infections, numbness, etc. Try to limit sweets, white bread, white rice, white potatoes It is okay with me for you to not check your fingerstick blood sugars unless you are interested and feel it would be helpful for you

## 2018-06-23 ENCOUNTER — Other Ambulatory Visit: Payer: Self-pay | Admitting: Family Medicine

## 2018-06-23 LAB — HEMOGLOBIN A1C
HEMOGLOBIN A1C: 5.9 %{Hb} — AB (ref <5.7)
MEAN PLASMA GLUCOSE: 123 (calc)
eAG (mmol/L): 6.8 (calc)

## 2018-06-23 LAB — COMPLETE METABOLIC PANEL WITH GFR
AG Ratio: 1.6 (calc) (ref 1.0–2.5)
ALT: 12 U/L (ref 6–29)
AST: 21 U/L (ref 10–35)
Albumin: 4.4 g/dL (ref 3.6–5.1)
Alkaline phosphatase (APISO): 50 U/L (ref 33–130)
BILIRUBIN TOTAL: 0.8 mg/dL (ref 0.2–1.2)
BUN: 9 mg/dL (ref 7–25)
CALCIUM: 9.4 mg/dL (ref 8.6–10.4)
CHLORIDE: 105 mmol/L (ref 98–110)
CO2: 29 mmol/L (ref 20–32)
Creat: 0.91 mg/dL (ref 0.60–0.93)
GFR, EST AFRICAN AMERICAN: 73 mL/min/{1.73_m2} (ref >=60)
GFR, Est Non African American: 63 mL/min/{1.73_m2} (ref >=60)
GLUCOSE: 86 mg/dL (ref 65–99)
Globulin: 2.7 g/dL (calc) (ref 1.9–3.7)
Potassium: 3.2 mmol/L — ABNORMAL LOW (ref 3.5–5.3)
Sodium: 141 mmol/L (ref 135–146)
TOTAL PROTEIN: 7.1 g/dL (ref 6.1–8.1)

## 2018-06-23 LAB — MAGNESIUM: Magnesium: 2.1 mg/dL (ref 1.5–2.5)

## 2018-06-23 LAB — LIPID PANEL
CHOL/HDL RATIO: 2.2 (calc) (ref <5.0)
Cholesterol: 154 mg/dL (ref <200)
HDL: 70 mg/dL (ref >50)
LDL Cholesterol (Calc): 70 mg/dL (calc)
NON-HDL CHOLESTEROL (CALC): 84 mg/dL (ref <130)
TRIGLYCERIDES: 63 mg/dL (ref <150)

## 2018-06-23 MED ORDER — POTASSIUM CHLORIDE ER 10 MEQ PO TBCR: 10.0000 meq | EXTENDED_RELEASE_TABLET | Freq: Two times a day (BID) | ORAL | 0 refills | Status: DC

## 2018-06-23 NOTE — Progress Notes (Signed)
Short course of KCl

## 2018-06-26 NOTE — Assessment & Plan Note (Signed)
Supportive care; limit NSAIDs; may try turmeric, tylenol, topicals

## 2018-06-26 NOTE — Assessment & Plan Note (Signed)
Goal LDL less than 70 

## 2018-06-26 NOTE — Assessment & Plan Note (Signed)
Foot exam by MD today; check a1c every 6 months while controlled; urine micro:alb yearly; eye exam yearly

## 2018-07-04 ENCOUNTER — Ambulatory Visit (INDEPENDENT_AMBULATORY_CARE_PROVIDER_SITE_OTHER): Payer: Medicare HMO | Admitting: Family Medicine

## 2018-07-04 ENCOUNTER — Encounter: Payer: Self-pay | Admitting: Family Medicine

## 2018-07-04 VITALS — BP 110/68 | HR 71 | Temp 98.0°F | Wt 120.0 lb

## 2018-07-04 DIAGNOSIS — J0100 Acute maxillary sinusitis, unspecified: Secondary | ICD-10-CM

## 2018-07-04 DIAGNOSIS — R05 Cough: Secondary | ICD-10-CM | POA: Diagnosis not present

## 2018-07-04 DIAGNOSIS — R059 Cough, unspecified: Secondary | ICD-10-CM

## 2018-07-04 MED ORDER — AZITHROMYCIN 250 MG PO TABS: ORAL_TABLET | ORAL | 0 refills | Status: DC

## 2018-07-04 NOTE — Progress Notes (Signed)
BP 110/68   Pulse 71   Temp 98 F (36.7 C) (Oral)   Wt 120 lb (54.4 kg)   SpO2 95%   BMI 21.95 kg/m    Subjective:    Patient ID: Kiara Brady, female    DOB: 08-20-1945, 72 y.o.   MRN: [id number]  HPI: Kiara Brady is a 72 y.o. female  Chief Complaint  Patient presents with  . Cough    runny nose, diarrhea, dizzness, sore throat  . Abdominal Pain    RLQ    HPI She is here for a sick visit Two days after last visit, she got sick Coughing, runny nose, diarrhea, dizziness, sore throat; some "controllable vomit"; the throat is "I don't like it all"; throat is "not mild, it's awful, I'm not making this up" High fever, "106 degrees is how it feels" Bought big pills for daytime and nighttime; she researched in the computer for the medicine for all her symptoms She tried vicks and "it got worse 100,000 times" When she coughs, something stabs her in her stomach, making stabbing motion lower abdomen She is asking for five day antibiotic; allergic to prednisone Patient declined foot exam Her knees are still bothering; she says she is disabled for her knees; she saw a doctor for her knees, doctor gave her shot for 3 days; Dr. Martha Clan; she says the shots didn't help  Depression screen [facility] 2/9 06/22/2018 04/24/2018 03/06/2018 12/19/2017 08/18/2017  Decreased Interest 0 0 0 0 0  Down, Depressed, Hopeless 0 0 0 0 0  PHQ - 2 Score 0 0 0 0 0  Altered sleeping 0 0 - - -  Tired, decreased energy 0 0 - - -  Change in appetite 0 0 - - -  Feeling bad or failure about yourself  0 0 - - -  Trouble concentrating 0 0 - - -  Moving slowly or fidgety/restless 0 0 - - -  Suicidal thoughts 0 0 - - -  PHQ-9 Score 0 0 - - -  Difficult doing work/chores Not difficult at all Not difficult at all - - -   Fall Risk  06/22/2018 04/24/2018 04/04/2018 03/06/2018 12/19/2017  Falls in the past year? 0 No No No No  Number falls in past yr: 0 - - - -    Relevant past medical, surgical, family and social  history reviewed Past Medical History:  Diagnosis Date  . Diabetes mellitus without complication (HCC)   . Hyperlipidemia   . Osteopenia of neck of femur 12/19/2017  . Osteoporosis 02/06/2018   July 2019   Past Surgical History:  Procedure Laterality Date  . VARICOSE VEIN SURGERY     Family History  Problem Relation Age of Onset  . Heart disease Mother   . Alzheimer's disease Mother   . Alcohol abuse Father   . Cancer Maternal Grandmother        breast  . Breast cancer Maternal Grandmother    Social History   Tobacco Use  . Smoking status: Former Games developer  . Smokeless tobacco: Never Used  Substance Use Topics  . Alcohol use: No  . Drug use: No     [facility]  AUDIT-C Score  0      Interim medical history since last visit reviewed. Allergies and medications reviewed  Review of Systems Per HPI unless specifically indicated above     Objective:    BP 110/68   Pulse 71  Temp 98 F (36.7 C) (Oral)   Wt 120 lb (54.4 kg)   SpO2 95%   BMI 21.95 kg/m   Wt Readings from Last 3 Encounters:  07/04/18 120 lb (54.4 kg)  06/22/18 120 lb 1.6 oz (54.5 kg)  04/24/18 124 lb (56.2 kg)    Physical Exam  Constitutional: She appears well-developed and well-nourished.  HENT:  Nose: Mucosal edema and rhinorrhea present.  Mouth/Throat: Mucous membranes are normal. No posterior oropharyngeal edema or posterior oropharyngeal erythema.  Eyes: EOM are normal. No scleral icterus.  Cardiovascular: Normal rate and regular rhythm.  Pulmonary/Chest: Effort normal and breath sounds normal. She has no decreased breath sounds. She has no wheezes. She has no rhonchi. She has no rales.  Frequent cough  Musculoskeletal:       Right knee: She exhibits deformity.       Left knee: She exhibits deformity.       Legs: Swelling and deformity of the medial distal knee joints bilaterally  Lymphadenopathy:    She has no cervical adenopathy.    Psychiatric: She has a normal mood and affect. Her behavior is normal.    Results for orders placed or performed in visit on 06/22/18  Hemoglobin A1C  Result Value Ref Range   Hgb A1c MFr Bld 5.9 (H) <5.7 % of total Hgb   Mean Plasma Glucose 123 (calc)   eAG (mmol/L) 6.8 (calc)  Lipid panel  Result Value Ref Range   Cholesterol 154 <200 mg/dL   HDL 70 >09>50 mg/dL   Triglycerides 63 <811<150 mg/dL   LDL Cholesterol (Calc) 70 mg/dL (calc)   Total CHOL/HDL Ratio 2.2 <5.0 (calc)   Non-HDL Cholesterol (Calc) 84 <914<130 mg/dL (calc)  COMPLETE METABOLIC PANEL WITH GFR  Result Value Ref Range   Glucose, Bld 86 65 - 99 mg/dL   BUN 9 7 - 25 mg/dL   Creat 7.820.91 9.560.60 - 2.130.93 mg/dL   GFR, Est Non African American 63 > OR = 60 mL/min/1.5873m2   GFR, Est African American 73 > OR = 60 mL/min/1.6573m2   BUN/Creatinine Ratio NOT APPLICABLE 6 - 22 (calc)   Sodium 141 135 - 146 mmol/L   Potassium 3.2 (L) 3.5 - 5.3 mmol/L   Chloride 105 98 - 110 mmol/L   CO2 29 20 - 32 mmol/L   Calcium 9.4 8.6 - 10.4 mg/dL   Total Protein 7.1 6.1 - 8.1 g/dL   Albumin 4.4 3.6 - 5.1 g/dL   Globulin 2.7 1.9 - 3.7 g/dL (calc)   AG Ratio 1.6 1.0 - 2.5 (calc)   Total Bilirubin 0.8 0.2 - 1.2 mg/dL   Alkaline phosphatase (APISO) 50 33 - 130 U/L   AST 21 10 - 35 U/L   ALT 12 6 - 29 U/L  Magnesium  Result Value Ref Range   Magnesium 2.1 1.5 - 2.5 mg/dL      Assessment & Plan:   Problem List Items Addressed This Visit    None    Visit Diagnoses    Cough    -  Primary   rest, hydration; antibiotics considered; risk of c diff discussed   Acute non-recurrent maxillary sinusitis       antibiotics, rest, hydration, risk of C diff discussed   Relevant Medications   azithromycin (ZITHROMAX) 250 MG tablet       Follow up plan: Return if symptoms worsen or fail to improve.  An after-visit summary was printed and given to the patient at check-out.  Please see  the patient instructions which may contain other information and  recommendations beyond what is mentioned above in the assessment and plan.  Meds ordered this encounter  Medications  . azithromycin (ZITHROMAX) 250 MG tablet    Sig: Two by mouth on day one, then one daily for four more days    Dispense:  6 tablet    Refill:  0    No orders of the defined types were placed in this encounter.

## 2018-07-04 NOTE — Patient Instructions (Addendum)
Please do eat yogurt or kimchi or take a probiotic daily for the next month We want to replace the healthy germs in the gut If you notice foul, watery diarrhea in the next two months, schedule an appointment RIGHT AWAY or go to an urgent care or the emergency room if a holiday or over a weekend Try vitamin C (orange juice if not diabetic or vitamin C tablets) and drink green tea to help your immune system during your illness Get plenty of rest and hydration Call if worse or go to the ER or urgent care if your condition gets much worse

## 2018-07-20 ENCOUNTER — Ambulatory Visit: Payer: Medicare HMO

## 2018-07-27 ENCOUNTER — Ambulatory Visit: Payer: Medicare HMO

## 2018-08-01 ENCOUNTER — Ambulatory Visit (INDEPENDENT_AMBULATORY_CARE_PROVIDER_SITE_OTHER): Payer: Medicare HMO

## 2018-08-01 VITALS — BP 122/62 | HR 63 | Temp 97.9°F | Resp 16 | Ht 62.0 in | Wt 120.4 lb

## 2018-08-01 DIAGNOSIS — Z0000 Encounter for general adult medical examination without abnormal findings: Secondary | ICD-10-CM

## 2018-08-01 DIAGNOSIS — Z1159 Encounter for screening for other viral diseases: Secondary | ICD-10-CM

## 2018-08-01 NOTE — Progress Notes (Signed)
Subjective:   Kiara Brady is a 72 y.o. female who presents for Medicare Annual (Subsequent) preventive examination.  Review of Systems:   Cardiac Risk Factors include: dyslipidemia;diabetes mellitus;advanced age (>2555men, 31>65 women)     Objective:     Vitals: BP 122/62 (BP Location: Left Arm, Patient Position: Sitting, Cuff Size: Normal)   Pulse 63   Temp 97.9 F (36.6 C) (Oral)   Resp 16   Ht 5\' 2"  (1.575 m)   Wt 120 lb 6.4 oz (54.6 kg)   SpO2 93%   BMI 22.02 kg/m   Body mass index is 22.02 kg/m.  Advanced Directives 08/01/2018 05/25/2017 05/17/2017 04/11/2017 03/17/2017 01/10/2017 01/10/2017  Does Patient Have a Medical Advance Directive? No No No No No No No  Would patient like information on creating a medical advance directive? Yes (MAU/Ambulatory/Procedural Areas - Information given) - - - - Yes (ED - Information included in AVS) -    Tobacco Social History   Tobacco Use  Smoking Status Former Smoker  Smokeless Tobacco Never Used     Counseling given: Not Answered   Clinical Intake:  Pre-visit preparation completed: Yes  Pain : 0-10 Pain Score: 6  Pain Type: Acute pain Pain Location: Back Pain Orientation: Lower(lower left back) Pain Descriptors / Indicators: Sharp Pain Onset: More than a month ago Pain Frequency: Intermittent     Nutritional Status: BMI of 19-24  Normal Diabetes: Yes CBG done?: No Did pt. bring in CBG monitor from home?: No   Nutrition Risk Assessment:  Has the patient had any N/V/D within the last 2 months?  No  Does the patient have any non-healing wounds?  No  Has the patient had any unintentional weight loss or weight gain?  No   Diabetes:  Is the patient diabetic?  Yes  If diabetic, was a CBG obtained today?  No  Did the patient bring in their glucometer from home?  No  How often do you monitor your CBG's? Pt does not actively check her blood sugar.   Financial Strains and Diabetes Management:  Are you having any  financial strains with the device, your supplies or your medication? No .  Does the patient want to be seen by Chronic Care Management for management of their diabetes?  No  Would the patient like to be referred to a Nutritionist or for Diabetic Management?  No   Diabetic Exams:  Diabetic Eye Exam: Completed 01/14/18 negative for retinopathy.   Diabetic Foot Exam: Completed 06/22/18.   How often do you need to have someone help you when you read instructions, pamphlets, or other written materials from your doctor or pharmacy?: 1 - Never What is the last grade level you completed in school?: master's degree  Interpreter Needed?: No  Information entered by :: Reather LittlerKasey Alyxander Kollmann LPN  Past Medical History:  Diagnosis Date  . Diabetes mellitus without complication (HCC)   . Hyperlipidemia   . Osteopenia of neck of femur 12/19/2017  . Osteoporosis 02/06/2018   July 2019  . Polio    [place]   Past Surgical History:  Procedure Laterality Date  . VARICOSE VEIN SURGERY     Family History  Problem Relation Age of Onset  . Heart disease Mother   . Alzheimer's disease Mother   . Alcohol abuse Father   . Cancer Maternal Grandmother        breast  . Breast cancer Maternal Grandmother    Social  History   Socioeconomic History  . Marital status: Divorced    Spouse name: Not on file  . Number of children: 2  . Years of education: Not on file  . Highest education level: Master's degree (e.g., MA, MS, MEng, MEd, MSW, MBA)  Occupational History  . Not on file  Social Needs  . Financial resource strain: Not hard at all  . Food insecurity:    Worry: Never true    Inability: Never true  . Transportation needs:    Medical: No    Non-medical: No  Tobacco Use  . Smoking status: Former Games developer  . Smokeless tobacco: Never Used  Substance and Sexual Activity  . Alcohol use: No  . Drug use: No  . Sexual activity: Not Currently  Lifestyle  . Physical activity:      Days per week: 0 days    Minutes per session: 0 min  . Stress: Only a little  Relationships  . Social connections:    Talks on phone: Twice a week    Gets together: Once a week    Attends religious service: Never    Active member of club or organization: No    Attends meetings of clubs or organizations: Never    Relationship status: Divorced  Other Topics Concern  . Not on file  Social History Narrative   Pt lives alone, daughter is in another country and son lives in [place]    Outpatient Encounter Medications as of 08/01/2018  Medication Sig  . atorvastatin (LIPITOR) 10 MG tablet TAKE 1 TABLET BY MOUTH ONCE DAILY AT  6PM  . ibuprofen (ADVIL,MOTRIN) 200 MG tablet Take 200 mg by mouth every 6 (six) hours as needed.  . metFORMIN (GLUCOPHAGE-XR) 500 MG 24 hr tablet Take 1 tablet (500 mg total) by mouth daily.  Marland Kitchen aspirin EC 81 MG tablet Take 1 tablet (81 mg total) by mouth daily. (Patient not taking: Reported on 08/01/2018)  . loratadine (CLARITIN) 10 MG tablet Take 1 tablet (10 mg total) by mouth daily. (Patient not taking: Reported on 06/22/2018)  . potassium chloride (KLOR-CON 10) 10 MEQ tablet Take 1 tablet (10 mEq total) by mouth 2 (two) times daily. (Patient not taking: Reported on 08/01/2018)  . [DISCONTINUED] azithromycin (ZITHROMAX) 250 MG tablet Two by mouth on day one, then one daily for four more days   No facility-administered encounter medications on file as of 08/01/2018.     Activities of Daily Living In your present state of health, do you have any difficulty performing the following activities: 08/01/2018 06/22/2018  Hearing? N N  Vision? N N  Comment wears reading glasses -  Difficulty concentrating or making decisions? N N  Walking or climbing stairs? N N  Dressing or bathing? N N  Doing errands, shopping? N N  Preparing Food and eating ? N -  Using the Toilet? N -  In the past six months, have you accidently leaked urine? Y -  Comment wears pads  for protection -  Do you have problems with loss of bowel control? N -  Managing your Medications? N -  Managing your Finances? N -  Housekeeping or managing your Housekeeping? N -  Some recent data might be hidden    Patient Care Team: [name], Janit Bern, MD as PCP - General (Family Medicine)    Assessment:   This is a routine wellness examination for Kiara Brady.  Exercise Activities and Dietary recommendations Current Exercise Habits: The patient does not participate in  regular exercise at present, Exercise limited by: None identified  Goals    . Exercise 3x per week (30 min per time)     Recommend walking 3 days a week for 20-30 minutes.       Fall Risk Fall Risk  08/01/2018 06/22/2018 04/24/2018 04/04/2018 03/06/2018  Falls in the past year? 0 0 No No No  Number falls in past yr: 0 0 - - -   FALL RISK PREVENTION PERTAINING TO THE HOME:  Any stairs in or around the home WITH handrails? No  Home free of loose throw rugs in walkways, pet beds, electrical cords, etc? Yes  Adequate lighting in your home to reduce risk of falls? Yes   ASSISTIVE DEVICES UTILIZED TO PREVENT FALLS:  Life alert? No  Use of a cane, walker or w/c? Yes  Grab bars in the bathroom? No  Shower chair or bench in shower? Yes  Elevated toilet seat or a handicapped toilet? Yes   DME ORDERS:  DME order needed?  No   TIMED UP AND GO:  Was the test performed? Yes .  Length of time to ambulate 10 feet: 6 sec.   GAIT:  Appearance of gait: Gait stead-fast and without the use of an assistive device.  Education: Fall risk prevention has been discussed.  Intervention(s) required? No    Depression Screen PHQ 2/9 Scores 08/01/2018 06/22/2018 04/24/2018 03/06/2018  PHQ - 2 Score 0 0 0 0  PHQ- 9 Score - 0 0 -     Cognitive Function     6CIT Screen 01/10/2017  What Year? 0 points  What month? 0 points  What time? 0 points  Count back from 20 0 points  Months in reverse 0 points  Repeat phrase 0 points    Total Score 0    Immunization History  Administered Date(s) Administered  . Influenza, High Dose Seasonal PF 06/09/2018  . Influenza-Unspecified 05/06/2017, 06/09/2018  . Pneumococcal Conjugate-13 06/05/2016, 07/01/2017    Qualifies for Shingles Vaccine? Yes . Due for Shingrix. Education has been provided regarding the importance of this vaccine. Pt has been advised to call insurance company to determine out of pocket expense. Advised may also receive vaccine at local pharmacy or Health Dept. Verbalized acceptance and understanding.  Tdap: Although this vaccine is not a covered service during a Wellness Exam, does the patient still wish to receive this vaccine today?  No .  Education has been provided regarding the importance of this vaccine. Advised may receive this vaccine at local pharmacy or Health Dept. Aware to provide a copy of the vaccination record if obtained from local pharmacy or Health Dept. Verbalized acceptance and understanding.  Flu Vaccine: Up to date  Pneumococcal Vaccine:Up to date   Screening Tests Health Maintenance  Topic Date Due  . TETANUS/TDAP  03/07/1965  . PNA vac Low Risk Adult (2 of 2 - PPSV23) 07/01/2018  . Hepatitis C Screening  12/20/2018 (Originally 1945/10/30)  . URINE MICROALBUMIN  12/20/2018  . HEMOGLOBIN A1C  12/21/2018  . OPHTHALMOLOGY EXAM  01/15/2019  . MAMMOGRAM  01/18/2019  . FOOT EXAM  06/23/2019  . COLONOSCOPY  06/03/2021  . INFLUENZA VACCINE  Completed  . DEXA SCAN  Completed    Cancer Screenings:  Colorectal Screening: Completed 06/04/2011. Repeat every 10 years;   Mammogram: Completed 01/17/18. Repeat every year;   Bone Density: Completed 01/31/18. Results reflect  OSTEOPOROSIS. Repeat every 2 years.   Lung Cancer Screening: (Low Dose CT Chest recommended if Age  55-80 years, 30 pack-year currently smoking OR have quit w/in 15years.) does not qualify.    Additional Screening:  Hepatitis C Screening: does qualify;  ordered.  Vision Screening: Recommended annual ophthalmology exams for early detection of glaucoma and other disorders of the eye. Is the patient up to date with their annual eye exam?  Yes  Who is the provider or what is the name of the office in which the pt attends annual eye exams? [facility]  Dental Screening: Recommended annual dental exams for proper oral hygiene  Community Resource Referral:  CRR required this visit?  No      Plan:    I have personally reviewed and addressed the Medicare Annual Wellness questionnaire and have noted the following in the patient's chart:  A. Medical and social history B. Use of alcohol, tobacco or illicit drugs  C. Current medications and supplements D. Functional ability and status E.  Nutritional status F.  Physical activity G. Advance directives H. List of other physicians I.  Hospitalizations, surgeries, and ER visits in previous 12 months J.  Vitals K. Screenings such as hearing and vision if needed, cognitive and depression L. Referrals and appointments   In addition, I have reviewed and discussed with patient certain preventive protocols, quality metrics, and best practice recommendations. A written personalized care plan for preventive services as well as general preventive health recommendations were provided to patient.    Signed,  Reather LittlerKasey Leanora Murin, LPN  Nurse Health Advisor   Nurse Notes: Pt c/o pain in knees due to arthritis but she does not want to go back to Dr. Martha ClanKrasinski because she does not feel like the injections were helpful. Pt would like to discuss this at next appt for possible referral to new ortho provider. She also c/o intermittent sharp pain in middle of lower left side of back that feels like a pinching type of pain and only last for a minute and goes away. She states she has allergy type symptoms when she leaves home and goes shopping or in public. She requested a zpak but she recently had one the  beginning of the month. She does not like OTC meds and plans to return to office if symptoms worsen or persist.

## 2018-08-01 NOTE — Patient Instructions (Signed)
[name] , Thank you for taking time to come for your Medicare Wellness Visit. I appreciate your ongoing commitment to your health goals. Please review the following plan we discussed and let me know if I can assist you in the future.   Screening recommendations/referrals: Colonoscopy: done 06/04/2011 repeat in 2022 Mammogram: done 01/17/18  Bone Density: done 01/31/18 repeat in 2021 Recommended yearly ophthalmology/optometry visit for glaucoma screening and checkup Recommended yearly dental visit for hygiene and checkup  Vaccinations: Influenza vaccine: done 06/09/18 Pneumococcal vaccine: done 07/01/17 Tdap vaccine: due - please contact us if you get a cut or scrape Shingles vaccine: Shingrix discussed. Please contact your pharmacy for coverage information.     Advanced directives: Advance directive discussed with you today. I have provided a copy for you to complete at home and have notarized. Once this is complete please bring a copy in to our office so we can scan it into your chart.  Conditions/risks identified: Recommend increasing physical activity to 90 minutes per week  Next appointment: Please follow up in one year for your Medicare Annual Wellness visit.     Preventive Care 4665 Years and Older, Female Preventive care refers to lifestyle choices and visits with your health care provider that can promote health and wellness. What does preventive care include?  A yearly physical exam. This is also called an annual well check.  Dental exams once or twice a year.  Routine eye exams. Ask your health care provider how often you should have your eyes checked.  Personal lifestyle choices, including:  Daily care of your teeth and gums.  Regular physical activity.  Eating a healthy diet.  Avoiding tobacco and drug use.  Limiting alcohol use.  Practicing safe sex.  Taking low-dose aspirin every day.  Taking vitamin and mineral supplements as recommended by your health care  provider. What happens during an annual well check? The services and screenings done by your health care provider during your annual well check will depend on your age, overall health, lifestyle risk factors, and family history of disease. Counseling  Your health care provider may ask you questions about your:  Alcohol use.  Tobacco use.  Drug use.  Emotional well-being.  Home and relationship well-being.  Sexual activity.  Eating habits.  History of falls.  Memory and ability to understand (cognition).  Work and work Astronomerenvironment.  Reproductive health. Screening  You may have the following tests or measurements:  Height, weight, and BMI.  Blood pressure.  Lipid and cholesterol levels. These may be checked every 5 years, or more frequently if you are over 73 years old.  Skin check.  Lung cancer screening. You may have this screening every year starting at age 73 if you have a 30-pack-year history of smoking and currently smoke or have quit within the past 15 years.  Fecal occult blood test (FOBT) of the stool. You may have this test every year starting at age 73.  Flexible sigmoidoscopy or colonoscopy. You may have a sigmoidoscopy every 5 years or a colonoscopy every 10 years starting at age 73.  Hepatitis C blood test.  Hepatitis B blood test.  Sexually transmitted disease (STD) testing.  Diabetes screening. This is done by checking your blood sugar (glucose) after you have not eaten for a while (fasting). You may have this done every 1-3 years.  Bone density scan. This is done to screen for osteoporosis. You may have this done starting at age 73.  Mammogram. This may be done  every 1-2 years. Talk to your health care provider about how often you should have regular mammograms. Talk with your health care provider about your test results, treatment options, and if necessary, the need for more tests. Vaccines  Your health care provider may recommend certain  vaccines, such as:  Influenza vaccine. This is recommended every year.  Tetanus, diphtheria, and acellular pertussis (Tdap, Td) vaccine. You may need a Td booster every 10 years.  Zoster vaccine. You may need this after age 53.  Pneumococcal 13-valent conjugate (PCV13) vaccine. One dose is recommended after age 23.  Pneumococcal polysaccharide (PPSV23) vaccine. One dose is recommended after age 20. Talk to your health care provider about which screenings and vaccines you need and how often you need them. This information is not intended to replace advice given to you by your health care provider. Make sure you discuss any questions you have with your health care provider. Document Released: 08/15/2015 Document Revised: 04/07/2016 Document Reviewed: 05/20/2015 Elsevier Interactive Patient Education  2017 [place] Prevention in the Home Falls can cause injuries. They can happen to people of all ages. There are many things you can do to make your home safe and to help prevent falls. What can I do on the outside of my home?  Regularly fix the edges of walkways and driveways and fix any cracks.  Remove anything that might make you trip as you walk through a door, such as a raised step or threshold.  Trim any bushes or trees on the path to your home.  Use bright outdoor lighting.  Clear any walking paths of anything that might make someone trip, such as rocks or tools.  Regularly check to see if handrails are loose or broken. Make sure that both sides of any steps have handrails.  Any raised decks and porches should have guardrails on the edges.  Have any leaves, snow, or ice cleared regularly.  Use sand or salt on walking paths during winter.  Clean up any spills in your garage right away. This includes oil or grease spills. What can I do in the bathroom?  Use night lights.  Install grab bars by the toilet and in the tub and shower. Do not use towel bars as grab  bars.  Use non-skid mats or decals in the tub or shower.  If you need to sit down in the shower, use a plastic, non-slip stool.  Keep the floor dry. Clean up any water that spills on the floor as soon as it happens.  Remove soap buildup in the tub or shower regularly.  Attach bath mats securely with double-sided non-slip rug tape.  Do not have throw rugs and other things on the floor that can make you trip. What can I do in the bedroom?  Use night lights.  Make sure that you have a light by your bed that is easy to reach.  Do not use any sheets or blankets that are too big for your bed. They should not hang down onto the floor.  Have a firm chair that has side arms. You can use this for support while you get dressed.  Do not have throw rugs and other things on the floor that can make you trip. What can I do in the kitchen?  Clean up any spills right away.  Avoid walking on wet floors.  Keep items that you use a lot in easy-to-reach places.  If you need to reach something above you, use a  strong step stool that has a grab bar.  Keep electrical cords out of the way.  Do not use floor polish or wax that makes floors slippery. If you must use wax, use non-skid floor wax.  Do not have throw rugs and other things on the floor that can make you trip. What can I do with my stairs?  Do not leave any items on the stairs.  Make sure that there are handrails on both sides of the stairs and use them. Fix handrails that are broken or loose. Make sure that handrails are as long as the stairways.  Check any carpeting to make sure that it is firmly attached to the stairs. Fix any carpet that is loose or worn.  Avoid having throw rugs at the top or bottom of the stairs. If you do have throw rugs, attach them to the floor with carpet tape.  Make sure that you have a light switch at the top of the stairs and the bottom of the stairs. If you do not have them, ask someone to add them for  you. What else can I do to help prevent falls?  Wear shoes that:  Do not have high heels.  Have rubber bottoms.  Are comfortable and fit you well.  Are closed at the toe. Do not wear sandals.  If you use a stepladder:  Make sure that it is fully opened. Do not climb a closed stepladder.  Make sure that both sides of the stepladder are locked into place.  Ask someone to hold it for you, if possible.  Clearly mark and make sure that you can see:  Any grab bars or handrails.  First and last steps.  Where the edge of each step is.  Use tools that help you move around (mobility aids) if they are needed. These include:  Canes.  Walkers.  Scooters.  Crutches.  Turn on the lights when you go into a dark area. Replace any light bulbs as soon as they burn out.  Set up your furniture so you have a clear path. Avoid moving your furniture around.  If any of your floors are uneven, fix them.  If there are any pets around you, be aware of where they are.  Review your medicines with your doctor. Some medicines can make you feel dizzy. This can increase your chance of falling. Ask your doctor what other things that you can do to help prevent falls. This information is not intended to replace advice given to you by your health care provider. Make sure you discuss any questions you have with your health care provider. Document Released: 05/15/2009 Document Revised: 12/25/2015 Document Reviewed: 08/23/2014 Elsevier Interactive Patient Education  2017 [employer].

## 2018-08-24 ENCOUNTER — Emergency Department
Admission: EM | Admit: 2018-08-24 | Discharge: 2018-08-24 | Disposition: A | Discharge status: Home / Self Care | Payer: Worker's Compensation | Attending: Emergency Medicine | Admitting: Emergency Medicine

## 2018-08-24 ENCOUNTER — Other Ambulatory Visit: Payer: Self-pay

## 2018-08-24 DIAGNOSIS — Z5321 Procedure and treatment not carried out due to patient leaving prior to being seen by health care provider: Secondary | ICD-10-CM | POA: Diagnosis not present

## 2018-08-24 DIAGNOSIS — M79601 Pain in right arm: Secondary | ICD-10-CM | POA: Insufficient documentation

## 2018-08-24 NOTE — ED Triage Notes (Signed)
Pt reports that while at work she was bitten by a child - she reports that the area bleed - no active bleeding at this time

## 2018-08-24 NOTE — ED Notes (Signed)
Spoke with Kiara Brady  States that the pt is to be at [facility]   Informed pt that she was in wrong place

## 2018-09-04 ENCOUNTER — Ambulatory Visit (INDEPENDENT_AMBULATORY_CARE_PROVIDER_SITE_OTHER): Payer: Medicare HMO | Admitting: Nurse Practitioner

## 2018-09-04 ENCOUNTER — Encounter: Payer: Self-pay | Admitting: Nurse Practitioner

## 2018-09-04 VITALS — BP 128/64 | HR 70 | Temp 98.5°F | Resp 12 | Ht 62.0 in | Wt 120.4 lb

## 2018-09-04 DIAGNOSIS — R05 Cough: Secondary | ICD-10-CM | POA: Diagnosis not present

## 2018-09-04 DIAGNOSIS — J3489 Other specified disorders of nose and nasal sinuses: Secondary | ICD-10-CM

## 2018-09-04 DIAGNOSIS — J069 Acute upper respiratory infection, unspecified: Secondary | ICD-10-CM | POA: Diagnosis not present

## 2018-09-04 DIAGNOSIS — R059 Cough, unspecified: Secondary | ICD-10-CM

## 2018-09-04 MED ORDER — LEVOCETIRIZINE DIHYDROCHLORIDE 5 MG PO TABS: 5.0000 mg | ORAL_TABLET | Freq: Every evening | ORAL | 0 refills | Status: DC

## 2018-09-04 MED ORDER — BENZONATATE 100 MG PO CAPS: 100.0000 mg | ORAL_CAPSULE | Freq: Two times a day (BID) | ORAL | 0 refills | Status: DC | PRN

## 2018-09-04 NOTE — Patient Instructions (Addendum)
- Take xyzal 5mg  daily for the next week and then can continue if needed for runny nose  - Take tessalon perls 100mg  three times a day as needed for coughing - Please get chest xray if symptoms are not improved with medications, or you if you develop shortness of breath. - Drinking plenty of water, resting, eating nutritious food, plenty of vitamin C. Honey-lemon teas.  - If this does not help your symptoms in a week please call or message [place].   Allergic Rhinitis, Adult Allergic rhinitis is a reaction to allergens in the air. Allergens are tiny specks (particles) in the air that cause your body to have an allergic reaction. This condition cannot be passed from person to person (is not contagious). Allergic rhinitis cannot be cured, but it can be controlled. There are two types of allergic rhinitis:  Seasonal. This type is also called hay fever. It happens only during certain times of the year.  Perennial. This type can happen at any time of the year. What are the causes? This condition may be caused by:  Pollen from grasses, trees, and weeds.  House dust mites.  Pet dander.  Mold. What are the signs or symptoms? Symptoms of this condition include:  Sneezing.  Runny or stuffy nose (nasal congestion).  A lot of mucus in the back of the throat (postnasal drip).  Itchy nose.  Tearing of the eyes.  Trouble sleeping.  Being sleepy during day. How is this treated? There is no cure for this condition. You should avoid things that trigger your symptoms (allergens). Treatment can help to relieve symptoms. This may include:  Medicines that block allergy symptoms, such as antihistamines. These may be given as a shot, nasal spray, or pill.  Shots that are given until your body becomes less sensitive to the allergen (desensitization).  Stronger medicines, if all other treatments have not worked. Follow these instructions at home: Avoiding allergens   Find out what you are  allergic to. Common allergens include smoke, dust, and pollen.  Avoid them if you can. These are some of the things that you can do to avoid allergens: ? Replace carpet with wood, tile, or vinyl flooring. Carpet can trap dander and dust. ? Clean any mold found in the home. ? Do not smoke. Do not allow smoking in your home. ? Change your heating and air conditioning filter at least once a month. ? During allergy season:  Keep windows closed as much as you can. If possible, use air conditioning when there is a lot of pollen in the air.  Use a special filter for allergies with your furnace and air conditioner.  Plan outdoor activities when pollen counts are lowest. This is usually during the early morning or evening hours.  If you do go outdoors when pollen count is high, wear a special mask for people with allergies.  When you come indoors, take a shower and change your clothes before sitting on furniture or bedding. General instructions  Do not use fans in your home.  Do not hang clothes outside to dry.  Wear sunglasses to keep pollen out of your eyes.  Wash your hands right away after you touch household pets.  Take over-the-counter and prescription medicines only as told by your doctor.  Keep all follow-up visits as told by your doctor. This is important. Contact a doctor if:  You have a fever.  You have a cough that does not go away (is persistent).  You start to  make whistling sounds when you breathe (wheeze).  Your symptoms do not get better with treatment.  You have thick fluid coming from your nose.  You start to have nosebleeds. Get help right away if:  Your tongue or your lips are swollen.  You have trouble breathing.  You feel dizzy or you feel like you are going to pass out (faint).  You have cold sweats. Summary  Allergic rhinitis is a reaction to allergens in the air.  This condition may be caused by allergens. These include pollen, dust mites, pet  dander, and mold.  Symptoms include a runny, itchy nose, sneezing, or tearing eyes. You may also have trouble sleeping or feel sleepy during the day.  Treatment includes taking medicines and avoiding allergens. You may also get shots or take stronger medicines.  Get help if you have a fever or a cough that does not stop. Get help right away if you are short of breath. This information is not intended to replace advice given to you by your health care provider. Make sure you discuss any questions you have with your health care provider. Document Released: 11/18/2010 Document Revised: 02/07/2018 Document Reviewed: 02/07/2018 Elsevier Interactive Patient Education  2019 [employer]

## 2018-09-04 NOTE — Progress Notes (Signed)
Name: Kiara Brady   MRN: [id number]    DOB: 26-Apr-1946   Date:09/04/2018       Progress Note  Subjective  Chief Complaint  Chief Complaint  Patient presents with  . Allergic Rhinitis     patient stated that she is allergic to everything and she doesn't feel well. feels like her eyes are burning, some sneezing. patient stated that the Zpack works for her.  . Chest grinding    patient stated that she hears a grinding in her chest    HPI  Patient endorses 2 weeks of strong cough- yellow  Sputum. States having runny nose, mild sore throat from coughing. States when she lays down hears wheezing in her chest.  States she takes vinegar showers and ginger root soup- [place] remedy. Patient states in the past she has tried other antibiotics and medications over-the-counter and they have made her very drowsy and only azithromycin works for her.  She was recently on azithromycin at the end of December.  Denies fevers, chills, shortness of breath, facial pain.  Patient Active Problem List   Diagnosis Date Noted  . Osteoporosis 02/06/2018  . 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 Degenerative arthritis of left knee 07/04/2017  . Hyperlipidemia 03/23/2016  . Cardiac murmur, previously undiagnosed 01/23/2016  . Heart murmur 09/03/2015  . Left lateral knee pain 06/30/2015  . Pain of left knee and lower leg 06/10/2015  . Dyslipidemia associated with type 2 diabetes mellitus (HCC) 02/26/2015  . Diabetes mellitus, type 2 (HCC) 01/06/2015    Past Medical History:  Diagnosis Date  . Diabetes mellitus without complication (HCC)   . Hyperlipidemia   . Osteopenia of neck of femur 12/19/2017  . Osteoporosis 02/06/2018   July 2019  . Polio    [place]    Past Surgical History:  Procedure Laterality Date  . VARICOSE VEIN SURGERY      Social History   Tobacco Use  . Smoking status: Former Games developer  . Smokeless tobacco: Never Used   Substance Use Topics  . Alcohol use: No     Current Outpatient Medications:  .  emtricitabine-tenofovir (TRUVADA) 200-300 MG tablet, Take by mouth., Disp: , Rfl:  .  raltegravir (ISENTRESS) 400 MG tablet, Take by mouth., Disp: , Rfl:  .  aspirin EC 81 MG tablet, Take 1 tablet (81 mg total) by mouth daily. (Patient not taking: Reported on 08/01/2018), Disp: , Rfl:  .  atorvastatin (LIPITOR) 10 MG tablet, TAKE 1 TABLET BY MOUTH ONCE DAILY AT  6PM, Disp: 90 tablet, Rfl: 0 .  ibuprofen (ADVIL,MOTRIN) 200 MG tablet, Take 200 mg by mouth every 6 (six) hours as needed., Disp: , Rfl:  .  loratadine (CLARITIN) 10 MG tablet, Take 1 tablet (10 mg total) by mouth daily. (Patient not taking: Reported on 06/22/2018), Disp: 30 tablet, Rfl: 0 .  metFORMIN (GLUCOPHAGE-XR) 500 MG 24 hr tablet, Take 1 tablet (500 mg total) by mouth daily., Disp: 90 tablet, Rfl: 0 .  potassium chloride (KLOR-CON 10) 10 MEQ tablet, Take 1 tablet (10 mEq total) by mouth 2 (two) times daily. (Patient not taking: Reported on 08/01/2018), Disp: 6 tablet, Rfl: 0  Allergies  Allergen Reactions  . Oxycodone Other (See Comments)  . Penicillin G Hives and Shortness Of Breath  . Penicillins   . Prednisone Cough  . Xanax [Alprazolam]     hyperactivity  . Fosamax [Alendronate Sodium] Nausea Only  ROS   No other specific complaints in a complete review of systems (except as listed in HPI above).  Objective  Vitals:   09/04/18 1601  BP: 128/64  Pulse: 70  Resp: 12  Temp: 98.5 F (36.9 C)  TempSrc: Oral  SpO2: 95%  Weight: 120 lb 6.4 oz (54.6 kg)  Height: 5\' 2"  (1.575 m)     Body mass index is 22.02 kg/m.  Nursing Note and Vital Signs reviewed.  Physical Exam Constitutional:      Appearance: Normal appearance.  HENT:     Head: Normocephalic and atraumatic.     Right Ear: Hearing, tympanic membrane, ear canal and external ear normal.     Left Ear: Hearing, tympanic membrane, ear canal and external ear  normal.     Nose: Rhinorrhea present.     Right Sinus: No maxillary sinus tenderness or frontal sinus tenderness.     Left Sinus: No maxillary sinus tenderness or frontal sinus tenderness.     Mouth/Throat:     Mouth: Mucous membranes are moist.     Pharynx: Uvula midline. No oropharyngeal exudate or posterior oropharyngeal erythema.  Eyes:     General:        Right eye: No discharge.        Left eye: No discharge.     Conjunctiva/sclera: Conjunctivae normal.  Neck:     Musculoskeletal: Normal range of motion.  Cardiovascular:     Rate and Rhythm: Normal rate.  Pulmonary:     Effort: Pulmonary effort is normal.     Breath sounds: Normal breath sounds.  Lymphadenopathy:     Cervical: No cervical adenopathy.  Skin:    General: Skin is warm and dry.     Findings: No rash.  Neurological:     Mental Status: She is alert.  Psychiatric:        Judgment: Judgment normal.       No results found for this or any previous visit (from the past 48 hour(s)).  Assessment & Plan  1. Cough - DG Chest 2 View; Future - benzonatate (TESSALON) 100 MG capsule; Take 1 capsule (100 mg total) by mouth 2 (two) times daily as needed for cough.  Dispense: 20 capsule; Refill: 0  2. Rhinorrhea - levocetirizine (XYZAL) 5 MG tablet; Take 1 tablet (5 mg total) by mouth every evening.  Dispense: 30 tablet; Refill: 0  3. Viral URI Patient requesting Z-Pak, no indication for this at the time this time discussed evidence-based practice with patient and antibiotic resistance.  Discussed with patient if OTC medications not provide any relief can complete chest x-ray for subacute cough to evaluate for other causes.  In office patient has dry cough, lungs clear bilaterally throughout.  Discussed signs of secondary infection and bacterial infections and indications for antibiotics.   -Red flags and when to present for emergency care or RTC including fever >101.47F, chest pain, shortness of breath,  new/worsening/un-resolving symptoms, reviewed with patient at time of visit. Follow up and care instructions discussed and provided in AVS.

## 2018-09-11 ENCOUNTER — Ambulatory Visit: Payer: Self-pay

## 2018-09-11 NOTE — Telephone Encounter (Signed)
Patient stated that she is not taking that medication anymore "it knocked me out and hurt my body" and i'm not doing no more test.   "all I want is the antibiotic - azithromycin totally clears me up."

## 2018-09-11 NOTE — Telephone Encounter (Signed)
Pt called to say she took XYZAL for the first time. She states that she needed it for allergies. She says it knocked her out and when she woke up she could not walk and her bones hurt forehead was hot and mouth was dry. She says she has a productive  Cough. She coughs up clear white sticky mucus.  When she goes out she sneezes a lot.  No runny nose or fever. She states she will not take any more.  She says the only thing that she wants to take is Azithromycin. Pt read care advice and told that this message would be routed to the office for response.  Reason for Disposition . Caller has NON-URGENT medication question about med that PCP prescribed and triager unable to answer question  Answer Assessment - Initial Assessment Questions 1. SYMPTOMS: "Do you have any symptoms?"     alergy symptoms 2. SEVERITY: If symptoms are present, ask "Are they mild, moderate or severe?"     Tired,sore mouth, hard to walk bones hurting, after take Xyzal,  Protocols used: MEDICATION QUESTION CALL-A-AH

## 2018-09-11 NOTE — Telephone Encounter (Signed)
Please have patient complete chest x-ray ordered, so we can properly evaluate her chronic cough.

## 2018-09-12 NOTE — Telephone Encounter (Signed)
Azithromycin is an antibiotic used to treat bacterial infection. She needs to get an xray or be re-assessed with bacterial indication to treat with a z-pack. Discussed this with her PCP who is agrees.

## 2018-09-12 NOTE — Telephone Encounter (Signed)
I tried to contact this patient to review Kiara Brady's message but there was no answer. A brief message was left on her voicemail with that information on there and I asked her to give our office a call if she had any additional questions.

## 2018-09-15 DIAGNOSIS — Z760 Encounter for issue of repeat prescription: Secondary | ICD-10-CM | POA: Diagnosis not present

## 2018-09-15 DIAGNOSIS — J029 Acute pharyngitis, unspecified: Secondary | ICD-10-CM | POA: Diagnosis not present

## 2018-10-17 ENCOUNTER — Other Ambulatory Visit: Payer: Self-pay | Admitting: Family Medicine

## 2018-10-17 DIAGNOSIS — E119 Type 2 diabetes mellitus without complications: Secondary | ICD-10-CM

## 2018-10-17 DIAGNOSIS — E1169 Type 2 diabetes mellitus with other specified complication: Secondary | ICD-10-CM

## 2018-10-17 DIAGNOSIS — E785 Hyperlipidemia, unspecified: Principal | ICD-10-CM

## 2018-10-17 MED ORDER — ATORVASTATIN CALCIUM 10 MG PO TABS
ORAL_TABLET | ORAL | 0 refills | Status: DC
Start: 2018-10-17 — End: 2019-01-15

## 2018-10-17 MED ORDER — METFORMIN HCL ER 500 MG PO TB24: 500.0000 mg | ORAL_TABLET | Freq: Every day | ORAL | 0 refills | Status: DC

## 2018-10-17 NOTE — Telephone Encounter (Signed)
Copied from CRM [id number]. Topic: Quick Communication - Rx Refill/Question >> Oct 17, 2018  3:11 PM Jaquita Rector A wrote: Medication: atorvastatin (LIPITOR) 10 MG tablet, metFORMIN (GLUCOPHAGE-XR) 500 MG 24 hr tablet, potassium chloride (KLOR-CON 10) 10 MEQ tablet  Has the patient contacted their pharmacy? Yes.   (Agent: If no, request that the patient contact the pharmacy for the refill.) (Agent: If yes, when and what did the pharmacy advise?)  Preferred Pharmacy (with phone number or street name): [facility] [address] [place] - [address] [phone] (Phone) [phone] (Fax)    Agent: Please be advised that RX refills may take up to 3 business days. We ask that you follow-up with your pharmacy.

## 2018-10-17 NOTE — Telephone Encounter (Signed)
KCl was just short-term Denied Lab Results  Component Value Date   ALT 12 06/22/2018   Lab Results  Component Value Date   CHOL 154 06/22/2018   HDL 70 06/22/2018   LDLCALC 70 06/22/2018   TRIG 63 06/22/2018   CHOLHDL 2.2 06/22/2018   Lab Results  Component Value Date   CREATININE 0.91 06/22/2018

## 2018-11-09 ENCOUNTER — Ambulatory Visit: Payer: Self-pay | Admitting: *Deleted

## 2018-11-09 ENCOUNTER — Telehealth (INDEPENDENT_AMBULATORY_CARE_PROVIDER_SITE_OTHER): Payer: Medicare HMO | Admitting: Family Medicine

## 2018-11-09 DIAGNOSIS — K0889 Other specified disorders of teeth and supporting structures: Secondary | ICD-10-CM

## 2018-11-09 NOTE — Progress Notes (Signed)
Virtual Visit via Telephone/Video Note   Note received earlier today from CMA; note sent back requesting a visit; she left voicemail with patient  Note said no facial swelling; no localized swelling at the area No comment about fever  I tried to connect with the patient via: telephone to do a telehealth visit on my own Call started: 6:44 pm Call terminated: 6:46 pm  Total length of call: 2 minutes, detailed message left   I explained in detailed message that we are trying to reach her to set up an appointment to talk with her, review symptoms, multiple allergies, etc.  I explained that we need to talk to her, set her up for an appointment (telehealth is okay during this time of COVID-19 pandemic)  If symptoms worsen, she develops fever, facial swelling, etc., then she needs to go to UC or ER  I asked her to please call the office to schedule a visit tomorrow morning

## 2018-11-09 NOTE — Telephone Encounter (Signed)
Pt reports tooth decayed and loose. States upper molar, right side. States has been loose for 1 week. Also reports a "Pus pocket." Denies swelling at area. States "It is ready to fall out." States painful.  Attempted to see dentist, states "They are all closed." Pt initially calling to request antibiotic be called in "When tooth does come out." Pt called during practices lunch break.  Pt declines virtual appt, States "I don't know how." Assured pt pratice would assist her if such appt needed." Pt states again she would like antibiotic. Also reports "Many allergies to medications." Please advise: [phone] Reason for Disposition . Brown cavity visible in the painful tooth  Answer Assessment - Initial Assessment Questions 1. LOCATION: "Which tooth is hurting?"  (e.g., right-side/left-side, upper/lower, front/back)     Upper right molar 2. ONSET: "When did the toothhe start?"  (e.g., hours, days)      1 week ago 3. SEVERITY: "How bad is the toothache?"  (Scale 1-10; mild, moderate or severe)   - MILD (1-3): doesn't interfere with chewing    - MODERATE (4-7): interferes with chewing, interferes with normal activities, awakens from sleep     - SEVERE (8-10): unable to eat, unable to do any normal activities, excruciating pain        moderate 4. SWELLING: "Is there any visible swelling of your face?"     no 5. OTHER SYMPTOMS: "Do you have any other symptoms?" (e.g., fever)     Tooth is loose, "Pus "present  Protocols used: TOOTHACHE-A-AH

## 2018-11-09 NOTE — Telephone Encounter (Signed)
Left detailed voicemail

## 2018-11-09 NOTE — Telephone Encounter (Signed)
We don't just call in antibiotics She will need an e-visit or telehealth visit

## 2018-12-11 ENCOUNTER — Ambulatory Visit: Payer: Medicare HMO | Admitting: Family Medicine

## 2018-12-14 ENCOUNTER — Ambulatory Visit: Payer: Medicare HMO | Admitting: Family Medicine

## 2019-01-01 ENCOUNTER — Telehealth: Payer: Self-pay | Admitting: Family Medicine

## 2019-01-01 ENCOUNTER — Other Ambulatory Visit: Payer: Self-pay | Admitting: Family Medicine

## 2019-01-01 DIAGNOSIS — Z1239 Encounter for other screening for malignant neoplasm of breast: Secondary | ICD-10-CM

## 2019-01-01 NOTE — Telephone Encounter (Signed)
Patient has not had mammogram since 2019.   There are no findings suspicious for malignancy. Routine screening was recommended annually. Due to pandemic routine screenings may be postponed.

## 2019-01-01 NOTE — Telephone Encounter (Signed)
Pt needs a call back to discuss her mammogram.

## 2019-01-08 ENCOUNTER — Other Ambulatory Visit: Payer: Self-pay | Admitting: Family Medicine

## 2019-01-10 ENCOUNTER — Other Ambulatory Visit: Payer: Self-pay | Admitting: Family Medicine

## 2019-01-10 DIAGNOSIS — E119 Type 2 diabetes mellitus without complications: Secondary | ICD-10-CM

## 2019-01-10 DIAGNOSIS — E1169 Type 2 diabetes mellitus with other specified complication: Secondary | ICD-10-CM

## 2019-01-10 NOTE — Telephone Encounter (Signed)
Pt called to follow up on request for referral being sent over to [facility] for her mammogram. She stated they have not received it yet. She would like a follow up.

## 2019-01-10 NOTE — Telephone Encounter (Signed)
Copied from [place] [id number]. Topic: Quick Communication - Rx Refill/Question >> Jan 10, 2019  3:23 PM [place], [place] D wrote: Medication: metFORMIN (GLUCOPHAGE-XR) 500 MG 24 hr tablet/atorvastatin (LIPITOR) 10 MG tablet   Has the patient contacted their pharmacy? Yes (Agent: If no, request that the patient contact the pharmacy for the refill.) (Agent: If yes, when and what did the pharmacy advise?)  Preferred Pharmacy (with phone number or street name): [facility] [address] [place] - [place] [phone] (Phone) [phone] (Fax)    Agent: Please be advised that RX refills may take up to 3 business days. We ask that you follow-up with your pharmacy.

## 2019-01-11 NOTE — Telephone Encounter (Signed)
New referral entered 

## 2019-01-11 NOTE — Addendum Note (Signed)
Addended by: Sibyl Parr on: 01/11/2019 08:28 AM   Modules accepted: Orders

## 2019-01-15 MED ORDER — ATORVASTATIN CALCIUM 10 MG PO TABS: ORAL_TABLET | ORAL | 0 refills | Status: DC

## 2019-01-15 MED ORDER — METFORMIN HCL ER 500 MG PO TB24: 500.0000 mg | ORAL_TABLET | Freq: Every day | ORAL | 0 refills | Status: DC

## 2019-01-17 ENCOUNTER — Ambulatory Visit: Payer: Medicare HMO | Admitting: Nurse Practitioner

## 2019-01-22 ENCOUNTER — Other Ambulatory Visit: Payer: Self-pay

## 2019-01-22 ENCOUNTER — Ambulatory Visit
Admission: RE | Admit: 2019-01-22 | Discharge: 2019-01-22 | Disposition: A | Discharge status: Home / Self Care | Payer: Medicare HMO | Source: Ambulatory Visit | Attending: Nurse Practitioner | Admitting: Nurse Practitioner

## 2019-01-22 DIAGNOSIS — Z1231 Encounter for screening mammogram for malignant neoplasm of breast: Secondary | ICD-10-CM | POA: Diagnosis not present

## 2019-01-22 DIAGNOSIS — Z1239 Encounter for other screening for malignant neoplasm of breast: Secondary | ICD-10-CM | POA: Diagnosis present

## 2019-01-26 ENCOUNTER — Telehealth: Payer: Self-pay

## 2019-01-26 MED ORDER — METFORMIN HCL 500 MG PO TABS: 500.0000 mg | ORAL_TABLET | Freq: Every day | ORAL | 1 refills | Status: DC

## 2019-01-26 NOTE — Telephone Encounter (Signed)
Recall was only for certain batches of metformin XR sending regular metformin to take with breakfast. Some nausea, diarrhea is expected for first few weeks of transition and then should resolve

## 2019-01-26 NOTE — Telephone Encounter (Signed)
Copied from [place] [id number]. Topic: General - Other >> Jan 26, 2019 10:06 AM Leward Quan A wrote: Reason for CRM: Patient called to say that she received a letter in reference to metFORMIN (GLUCOPHAGE-XR) 500 MG 24 hr tablet and need to know what to do. Per patient she stopped taking this medication about 2 weeks ago. Patient say she have not been checking her blood sugar either because she fears pricking her finger and she does not have a maching anyway. Patient would like a call back with some instructions. Please call Ph# [phone] per patient if no answer please leave a detailed message or text her and leave a number for her to call back.

## 2019-01-26 NOTE — Telephone Encounter (Signed)
Left detailed VM.  

## 2019-02-14 ENCOUNTER — Telehealth: Payer: Self-pay

## 2019-02-14 NOTE — Telephone Encounter (Signed)
Lvm to schedule appt.

## 2019-02-14 NOTE — Telephone Encounter (Signed)
I do not see any x-rays ordered. If she is having knee pain needs appointment. We do not routinely test for covid-19. if she is having symptoms or positive exposure she will need a virtual visit

## 2019-02-14 NOTE — Telephone Encounter (Signed)
Copied from [place] [id number]. Topic: General - Inquiry >> Feb 13, 2019  3:09 PM Kiara Brady wrote: Reason for CRM: Pt stated she needs a referral to get xrays for her knees so that Medicare will pay for it. Pt would also like to be tested for Covid-19 Please advise.

## 2019-02-27 ENCOUNTER — Encounter: Payer: Self-pay | Admitting: Nurse Practitioner

## 2019-02-27 ENCOUNTER — Ambulatory Visit (INDEPENDENT_AMBULATORY_CARE_PROVIDER_SITE_OTHER): Payer: Medicare HMO | Admitting: Nurse Practitioner

## 2019-02-27 ENCOUNTER — Other Ambulatory Visit: Payer: Self-pay

## 2019-02-27 VITALS — BP 136/84 | HR 65 | Temp 96.1°F | Resp 14 | Ht 62.0 in | Wt 133.9 lb

## 2019-02-27 DIAGNOSIS — E1169 Type 2 diabetes mellitus with other specified complication: Secondary | ICD-10-CM | POA: Diagnosis not present

## 2019-02-27 DIAGNOSIS — M810 Age-related osteoporosis without current pathological fracture: Secondary | ICD-10-CM

## 2019-02-27 DIAGNOSIS — R69 Illness, unspecified: Secondary | ICD-10-CM | POA: Diagnosis not present

## 2019-02-27 DIAGNOSIS — G8929 Other chronic pain: Secondary | ICD-10-CM

## 2019-02-27 DIAGNOSIS — Z1159 Encounter for screening for other viral diseases: Secondary | ICD-10-CM | POA: Diagnosis not present

## 2019-02-27 DIAGNOSIS — E785 Hyperlipidemia, unspecified: Secondary | ICD-10-CM

## 2019-02-27 DIAGNOSIS — Z114 Encounter for screening for human immunodeficiency virus [HIV]: Secondary | ICD-10-CM

## 2019-02-27 DIAGNOSIS — M25561 Pain in right knee: Secondary | ICD-10-CM | POA: Diagnosis not present

## 2019-02-27 DIAGNOSIS — M25562 Pain in left knee: Secondary | ICD-10-CM

## 2019-02-27 DIAGNOSIS — Z5181 Encounter for therapeutic drug level monitoring: Secondary | ICD-10-CM

## 2019-02-27 MED ORDER — ATORVASTATIN CALCIUM 10 MG PO TABS: ORAL_TABLET | ORAL | 1 refills | Status: DC

## 2019-02-27 MED ORDER — DICLOFENAC SODIUM 1 % TD GEL: 2.0000 g | Freq: Two times a day (BID) | TRANSDERMAL | 2 refills | Status: DC | PRN

## 2019-02-27 MED ORDER — METFORMIN HCL 500 MG PO TABS: 500.0000 mg | ORAL_TABLET | Freq: Every day | ORAL | 1 refills | Status: DC

## 2019-02-27 NOTE — Progress Notes (Signed)
Name: Kiara Brady   MRN: [id number]    DOB: 01/25/1946   Date:02/27/2019       Progress Note  Subjective  Chief Complaint  Chief Complaint  Patient presents with  . Knee Pain    bilateral    HPI  Patient endorses bilateral knee pain that has been going on for three years and has progressively gotten worse. States has had joint injections in the past with temporary relief. Pain is worse with sitting for a long time. She saw orthopedic Dr. Martha ClanKrasinski- states they did x-rays there but I am unable to locate these in media.States she saw Dr. Katrinka BlazingSmith- orthopedic in the past. She is requesting new imaging.     MRI of left knee 2017 Large radial tear through the root of the posterior horn of the medial meniscus. The body of the medial meniscus is extruded peripherally.  Marrow edema about the medial compartment is worst in the femoral condyle and most consistent with stress change.  Osteoarthritis about the knee appears worst in patellofemoral and medial compartments where it is advanced.  Baker's cyst. 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  Hyperlipidemia Patient rx atorvastatin 10mg  daily. Takes medications as prescribed with no missed doses a month.  Diet: eats lots of vegetables every day, eats fried foods sometimes- only eats fish occasionally.  Denies myalgias Lab Results  Component Value Date   CHOL 154 06/22/2018   HDL 70 06/22/2018   LDLCALC 70 06/22/2018   TRIG 63 06/22/2018   CHOLHDL 2.2 06/22/2018    Diabetes Mellitus Patient is rx metformin 500mg  daily. Takes medications as prescribed with no missed doses a month.  Endorses some polydipsia but thinks its because she is active and it is so hot outside.  Takes statin Refuses ARB states she is already taking too many medications.  Denies polyphagia, polyuria.  Lab Results  Component Value Date   HGBA1C 5.9 (H) 06/22/2018     PHQ2/9: Depression  screen [facility] 2/9 02/27/2019 09/04/2018 08/01/2018 06/22/2018 04/24/2018  Decreased Interest 0 0 0 0 0  Down, Depressed, Hopeless 1 0 0 0 0  PHQ - 2 Score 1 0 0 0 0  Altered sleeping 0 - - 0 0  Tired, decreased energy 0 - - 0 0  Change in appetite 0 - - 0 0  Feeling bad or failure about yourself  0 - - 0 0  Trouble concentrating 0 - - 0 0  Moving slowly or fidgety/restless 0 - - 0 0  Suicidal thoughts 0 - - 0 0  PHQ-9 Score 1 - - 0 0  Difficult doing work/chores Not difficult at all - - Not difficult at all Not difficult at all  Some recent data might be hidden     PHQ reviewed. Negative  Patient Active Problem List   Diagnosis Date Noted  . Osteoporosis 02/06/2018  . 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 Degenerative arthritis of left knee 07/04/2017  . Hyperlipidemia 03/23/2016  . Cardiac murmur, previously undiagnosed 01/23/2016  . Heart murmur 09/03/2015  . Dyslipidemia associated with type 2 diabetes mellitus (HCC) 02/26/2015  . Diabetes mellitus, type 2 (HCC) 01/06/2015    Past Medical History:  Diagnosis Date  . Diabetes mellitus without complication (HCC)   . Hyperlipidemia   . Osteopenia of neck of femur 12/19/2017  . Osteoporosis 02/06/2018   July 2019  . Polio    [place]    Past Surgical History:  Procedure Laterality Date  . VARICOSE VEIN SURGERY      Social History   Tobacco Use  . Smoking status: Former Games developermoker  . Smokeless tobacco: Never Used  Substance Use Topics  . Alcohol use: No     Current Outpatient Medications:  .  atorvastatin (LIPITOR) 10 MG tablet, TAKE 1 TABLET BY MOUTH ONCE DAILY AT  6PM, Disp: 30 tablet, Rfl: 0 .  metFORMIN (GLUCOPHAGE) 500 MG tablet, Take 1 tablet (500 mg total) by mouth daily with breakfast., Disp: 90 tablet, Rfl: 1 .  benzonatate (TESSALON) 100 MG capsule, Take 1 capsule (100 mg total) by mouth 2 (two) times daily as needed for cough. (Patient not taking:  Reported on 02/27/2019), Disp: 20 capsule, Rfl: 0 .  emtricitabine-tenofovir (TRUVADA) 200-300 MG tablet, Take by mouth., Disp: , Rfl:  .  ibuprofen (ADVIL,MOTRIN) 200 MG tablet, Take 200 mg by mouth every 6 (six) hours as needed., Disp: , Rfl:  .  levocetirizine (XYZAL) 5 MG tablet, Take 1 tablet (5 mg total) by mouth every evening. (Patient not taking: Reported on 02/27/2019), Disp: 30 tablet, Rfl: 0 .  raltegravir (ISENTRESS) 400 MG tablet, Take by mouth., Disp: , Rfl:   Allergies  Allergen Reactions  . Oxycodone Other (See Comments)  . Penicillin G Hives and Shortness Of Breath  . Penicillins   . Prednisone Cough  . Xanax [Alprazolam]     hyperactivity  . Fosamax [Alendronate Sodium] Nausea Only    ROS    No other specific complaints in a complete review of systems (except as listed in HPI above).  Objective  Vitals:   02/27/19 1501  BP: 136/84  Pulse: 65  Resp: 14  Temp: (!) 96.1 F (35.6 C)  TempSrc: Oral  SpO2: 99%  Weight: 133 lb 14.4 oz (60.7 kg)  Height: 5\' 2"  (1.575 m)     Body mass index is 24.49 kg/m.  Nursing Note and Vital Signs reviewed.  Physical Exam Constitutional:      Appearance: Normal appearance. She is well-developed.  HENT:     Head: Normocephalic and atraumatic.     Right Ear: Hearing normal.     Left Ear: Hearing normal.  Eyes:     Conjunctiva/sclera: Conjunctivae normal.  Cardiovascular:     Rate and Rhythm: Normal rate and regular rhythm.     Heart sounds: Murmur present.  Pulmonary:     Effort: Pulmonary effort is normal.     Breath sounds: Normal breath sounds.  Musculoskeletal:     Right knee: She exhibits decreased range of motion. She exhibits no effusion, no ecchymosis and no deformity. Tenderness found.     Left knee: She exhibits decreased range of motion. She exhibits no ecchymosis, no deformity, no laceration and no erythema. Tenderness found.  Skin:    General: Skin is warm and dry.     Findings: Erythema (on left  arm- patient noted to be scratching- reccomend hydrocortisone cream) present.  Neurological:     Mental Status: She is alert and oriented to person, place, and time.  Psychiatric:        Speech: Speech normal.        Behavior: Behavior normal. Behavior is cooperative.        Thought Content: Thought content normal.        Judgment: Judgment normal.        No results found for this or any previous  visit (from the past 48 hour(s)).  Assessment & Plan  1. Chronic pain of both knees - diclofenac sodium (VOLTAREN) 1 % GEL; Apply 2 g topically 2 (two) times daily as needed.  Dispense: 50 g; Refill: 2 - Ambulatory referral to Orthopedic Surgery  2. Dyslipidemia associated with type 2 diabetes mellitus (HCC) - HgB A1c - Lipid Profile - Urine Microalbumin w/creat. ratio - atorvastatin (LIPITOR) 10 MG tablet; TAKE 1 TABLET BY MOUTH ONCE DAILY AT  6PM  Dispense: 90 tablet; Refill: 1 - metFORMIN (GLUCOPHAGE) 500 MG tablet; Take 1 tablet (500 mg total) by mouth daily with breakfast.  Dispense: 90 tablet; Refill: 1  3. Age-related osteoporosis without current pathological fracture Supplementation, weight bearing exercise- declines medication   4. Encounter for hepatitis C screening test for low risk patient - Hepatitis C Antibody  5. Screening for HIV (human immunodeficiency virus) - HIV antibody (with reflex)  6. Medication monitoring encounter - COMPLETE METABOLIC PANEL WITH GFR    -Red flags and when to present for emergency care or RTC including fever >101.68F, chest pain, shortness of breath, new/worsening/un-resolving symptoms,  reviewed with patient at time of visit. Follow up and care instructions discussed and provided in AVS. -Reviewed Health Maintenance: patient states wants to discuss pneumonia vaccine with insurance first

## 2019-02-28 ENCOUNTER — Telehealth: Payer: Self-pay

## 2019-02-28 LAB — MICROALBUMIN / CREATININE URINE RATIO
Creatinine, Urine: 15 mg/dL — ABNORMAL LOW (ref 20–275)
Microalb Creat Ratio: 27 mcg/mg creat (ref <30)
Microalb, Ur: 0.4 mg/dL

## 2019-02-28 LAB — COMPLETE METABOLIC PANEL WITH GFR
AG Ratio: 1.7 (calc) (ref 1.0–2.5)
ALT: 14 U/L (ref 6–29)
AST: 19 U/L (ref 10–35)
Albumin: 4.6 g/dL (ref 3.6–5.1)
Alkaline phosphatase (APISO): 56 U/L (ref 37–153)
BUN/Creatinine Ratio: 9 (calc) (ref 6–22)
BUN: 9 mg/dL (ref 7–25)
CO2: 28 mmol/L (ref 20–32)
Calcium: 9.6 mg/dL (ref 8.6–10.4)
Chloride: 104 mmol/L (ref 98–110)
Creat: 0.95 mg/dL — ABNORMAL HIGH (ref 0.60–0.93)
GFR, Est African American: 69 mL/min/{1.73_m2} (ref >=60)
GFR, Est Non African American: 59 mL/min/{1.73_m2} — ABNORMAL LOW (ref >=60)
Globulin: 2.7 g/dL (calc) (ref 1.9–3.7)
Glucose, Bld: 118 mg/dL — ABNORMAL HIGH (ref 65–99)
Potassium: 3.5 mmol/L (ref 3.5–5.3)
Sodium: 140 mmol/L (ref 135–146)
Total Bilirubin: 0.7 mg/dL (ref 0.2–1.2)
Total Protein: 7.3 g/dL (ref 6.1–8.1)

## 2019-02-28 LAB — HEPATITIS C ANTIBODY
Hepatitis C Ab: NONREACTIVE
SIGNAL TO CUT-OFF: 0.11 (ref <1.00)

## 2019-02-28 LAB — LIPID PANEL
Cholesterol: 169 mg/dL (ref <200)
HDL: 73 mg/dL (ref >=50)
LDL Cholesterol (Calc): 72 mg/dL (calc)
Non-HDL Cholesterol (Calc): 96 mg/dL (calc) (ref <130)
Total CHOL/HDL Ratio: 2.3 (calc) (ref <5.0)
Triglycerides: 165 mg/dL — ABNORMAL HIGH (ref <150)

## 2019-02-28 LAB — HIV ANTIBODY (ROUTINE TESTING W REFLEX): HIV 1&2 Ab, 4th Generation: NONREACTIVE

## 2019-02-28 LAB — HEMOGLOBIN A1C
Hgb A1c MFr Bld: 6.1 % of total Hgb — ABNORMAL HIGH (ref <5.7)
Mean Plasma Glucose: 128 (calc)
eAG (mmol/L): 7.1 (calc)

## 2019-02-28 NOTE — Telephone Encounter (Signed)
Referral has been changed to Dr. Marry Guan.

## 2019-04-17 DIAGNOSIS — R69 Illness, unspecified: Secondary | ICD-10-CM | POA: Diagnosis not present

## 2019-04-23 ENCOUNTER — Ambulatory Visit: Payer: Medicare HMO | Admitting: Family Medicine

## 2019-05-10 DIAGNOSIS — R69 Illness, unspecified: Secondary | ICD-10-CM | POA: Diagnosis not present

## 2019-05-16 ENCOUNTER — Encounter: Payer: Self-pay | Admitting: Family Medicine

## 2019-05-16 ENCOUNTER — Ambulatory Visit (INDEPENDENT_AMBULATORY_CARE_PROVIDER_SITE_OTHER): Payer: Medicare HMO | Admitting: Family Medicine

## 2019-05-16 DIAGNOSIS — Z5329 Procedure and treatment not carried out because of patient's decision for other reasons: Secondary | ICD-10-CM

## 2019-05-17 ENCOUNTER — Ambulatory Visit
Admission: RE | Admit: 2019-05-17 | Discharge: 2019-05-17 | Disposition: A | Discharge status: Home / Self Care | Payer: Medicare HMO | Source: Ambulatory Visit | Attending: Family Medicine | Admitting: Family Medicine

## 2019-05-17 ENCOUNTER — Encounter: Payer: Self-pay | Admitting: Family Medicine

## 2019-05-17 ENCOUNTER — Other Ambulatory Visit: Payer: Self-pay

## 2019-05-17 ENCOUNTER — Ambulatory Visit (INDEPENDENT_AMBULATORY_CARE_PROVIDER_SITE_OTHER): Payer: Medicare HMO | Admitting: Family Medicine

## 2019-05-17 DIAGNOSIS — M79645 Pain in left finger(s): Secondary | ICD-10-CM

## 2019-05-17 DIAGNOSIS — M19042 Primary osteoarthritis, left hand: Secondary | ICD-10-CM | POA: Diagnosis not present

## 2019-05-17 NOTE — Progress Notes (Signed)
Name: MELESA LECY   MRN: [id number]    DOB: 05/05/1946   Date:05/17/2019       Progress Note  Subjective  Chief Complaint  Chief Complaint  Patient presents with  . Hand Pain    left thumb    I connected with  [name] on 05/17/19 at 11:20 AM EDT by telephone and verified that I am speaking with the correct person using two identifiers.   I discussed the limitations, risks, security and privacy concerns of performing an evaluation and management service by telephone and the availability of in person appointments. Staff also discussed with the patient that there may be a patient responsible charge related to this service. Patient Location: Home Provider Location: Office Additional Individuals present: None  HPI  Pt presents for concern for left arm is very sensitive; left thumb is so sensitive that it causes her pain when anything touches it. She has been putting cotton balls and tape on the thumb to protect and splint it. She is not sure when her pain started.  Denies swelling or redness to the area.  Does note there is some skin breakdown due to allergy to tape that she applied initially.  Endorses limited AROM, states the palmar aspect of the thumb is very painful with flexion.   Patient Active Problem List   Diagnosis Date Noted  . Osteoporosis 02/06/2018  . 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 Degenerative arthritis of left knee 07/04/2017  . Hyperlipidemia 03/23/2016  . Cardiac murmur, previously undiagnosed 01/23/2016  . Heart murmur 09/03/2015  . Dyslipidemia associated with type 2 diabetes mellitus (HCC) 02/26/2015  . Diabetes mellitus, type 2 (HCC) 01/06/2015    Social History   Tobacco Use  . Smoking status: Former Games developer  . Smokeless tobacco: Never Used  Substance Use Topics  . Alcohol use: No     Current Outpatient Medications:  .  atorvastatin (LIPITOR) 10 MG tablet, TAKE 1 TABLET BY MOUTH ONCE DAILY AT  6PM, Disp:  90 tablet, Rfl: 1 .  diclofenac sodium (VOLTAREN) 1 % GEL, Apply 2 g topically 2 (two) times daily as needed., Disp: 50 g, Rfl: 2 .  ibuprofen (ADVIL,MOTRIN) 200 MG tablet, Take 200 mg by mouth every 6 (six) hours as needed., Disp: , Rfl:  .  metFORMIN (GLUCOPHAGE) 500 MG tablet, Take 1 tablet (500 mg total) by mouth daily with breakfast., Disp: 90 tablet, Rfl: 1  Allergies  Allergen Reactions  . Oxycodone Other (See Comments)  . Penicillin G Hives and Shortness Of Breath  . Penicillins   . Prednisone Cough  . Xanax [Alprazolam]     hyperactivity  . Fosamax [Alendronate Sodium] Nausea Only    I personally reviewed active problem list, medication list, allergies, notes from last encounter, lab results with the patient/caregiver today.  ROS  Ten systems reviewed and is negative except as mentioned in HPI  Objective  Virtual encounter, vitals not obtained.  There is no height or weight on file to calculate BMI.  Nursing Note and Vital Signs reviewed.  Physical Exam  Pulmonary/Chest: Effort normal. No respiratory distress. Speaking in complete sentences Neurological: Pt is alert and oriented to person, place, and time. Speech is normal Psychiatric: Patient has a normal mood and affect. behavior is normal. Judgment and thought content normal.  No results found for this or any previous visit (from the past 72 hour(s)).  Assessment & Plan  1. Thumb pain, left - DG Hand  Complete Left; Future  -Red flags and when to present for emergency care or RTC including fever >101.2F, chest pain, shortness of breath, new/worsening/un-resolving symptoms, reviewed with patient at time of visit. Follow up and care instructions discussed and provided in AVS. - I discussed the assessment and treatment plan with the patient. The patient was provided an opportunity to ask questions and all were answered. The patient agreed with the plan and demonstrated an understanding of the instructions.  - The  patient was advised to call back or seek an in-person evaluation if the symptoms worsen or if the condition fails to improve as anticipated.  I provided  minutes of non-face-to-face time during this encounter.  Hubbard Hartshorn, FNP

## 2019-05-17 NOTE — Progress Notes (Signed)
   Complete physical exam  Patient: Kiara Brady   DOB: 05/22/2000   73 y.o. Female  MRN: [id number]  Subjective:    No chief complaint on file.   Kiara Brady is a 73 y.o. female who presents today for a complete physical exam. She reports consuming a {diet types:17450} diet. {types:19826} She generally feels {DESC; WELL/FAIRLY WELL/POORLY:18703}. She reports sleeping {DESC; WELL/FAIRLY WELL/POORLY:18703}. She {does/does not:200015} have additional problems to discuss today.    Most recent fall risk assessment:    01/27/2022   10:42 AM  Fall Risk   Falls in the past year? 0  Number falls in past yr: 0  Injury with Fall? 0  Risk for fall due to : No Fall Risks  Follow up Falls evaluation completed     Most recent depression screenings:    01/27/2022   10:42 AM 12/18/2020   10:46 AM  PHQ 2/9 Scores  PHQ - 2 Score 0 0  PHQ- 9 Score 5     {VISON DENTAL STD PSA (Optional):27386}  {History (Optional):23778}  Patient Care Team: Jessup, Joy, NP as PCP - General (Nurse Practitioner)   Outpatient Medications Prior to Visit  Medication Sig   fluticasone (FLONASE) 50 MCG/ACT nasal spray Place 2 sprays into both nostrils in the morning and at bedtime. After 7 days, reduce to once daily.   norgestimate-ethinyl estradiol (SPRINTEC 28) 0.25-35 MG-MCG tablet Take 1 tablet by mouth daily.   Nystatin POWD Apply liberally to affected area 2 times per day   spironolactone (ALDACTONE) 100 MG tablet Take 1 tablet (100 mg total) by mouth daily.   No facility-administered medications prior to visit.    ROS        Objective:     There were no vitals taken for this visit. {Vitals History (Optional):23777}  Physical Exam   No results found for any visits on 03/04/22. {Show previous labs (optional):23779}    Assessment & Plan:    Routine Health Maintenance and Physical Exam  Immunization History  Administered Date(s) Administered   DTaP 08/05/1999, 10/01/1999,  12/10/1999, 08/25/2000, 03/10/2004   Hepatitis A 01/05/2008, 01/10/2009   Hepatitis B 05/23/1999, 06/30/1999, 12/10/1999   HiB (PRP-OMP) 08/05/1999, 10/01/1999, 12/10/1999, 08/25/2000   IPV 08/05/1999, 10/01/1999, 05/30/2000, 03/10/2004   Influenza,inj,Quad PF,6+ Mos 04/12/2014   Influenza-Unspecified 07/12/2012   MMR 05/30/2001, 03/10/2004   Meningococcal Polysaccharide 01/10/2012   Pneumococcal Conjugate-13 08/25/2000   Pneumococcal-Unspecified 12/10/1999, 02/23/2000   Tdap 01/10/2012   Varicella 05/30/2000, 01/05/2008    Health Maintenance  Topic Date Due   HIV Screening  Never done   Hepatitis C Screening  Never done   INFLUENZA VACCINE  03/02/2022   PAP-Cervical Cytology Screening  03/04/2022 (Originally 05/21/2020)   PAP SMEAR-Modifier  03/04/2022 (Originally 05/21/2020)   TETANUS/TDAP  03/04/2022 (Originally 01/09/2022)   HPV VACCINES  Discontinued   COVID-19 Vaccine  Discontinued    Discussed health benefits of physical activity, and encouraged her to engage in regular exercise appropriate for her age and condition.  Problem List Items Addressed This Visit   None Visit Diagnoses     Annual physical exam    -  Primary   Cervical cancer screening       Need for Tdap vaccination          No follow-ups on file.     Joy Jessup, NP   

## 2019-07-20 ENCOUNTER — Telehealth: Payer: Self-pay | Admitting: Family Medicine

## 2019-07-20 NOTE — Telephone Encounter (Signed)
Pt states that her medication is poisoning her body.  States that she stopped all medications and feels better.  Pt states that she wants to know if she can transfer her care to a MD at the practice.  States that she isn't comfortable with a  NP seeing her. Pt states if she can't see a MD at the office she will transfer elsewhere so she can have a MD. Pt can be reached at [phone].

## 2019-07-20 NOTE — Telephone Encounter (Signed)
Please return pt call she would like to discuss if she need to take the medication tonight. She doesn't feel well after she takes it but feels fine before taking it. She would also like to discuss the Covid-19 shot. Afraid to go around ppl. Requesting return call today

## 2019-07-20 NOTE — Telephone Encounter (Signed)
Pt called told right now covid shot not ready for patients.  Also told pt that if provider prescribed a mediation then they fill like she needs it.  Told pt we have new MD starting in jan and she could transfer her care to him and discuss all her issues.  She will call back at beginning of year to schedule.

## 2019-08-06 NOTE — Telephone Encounter (Signed)
Patient is calling for advise ask she feeling "weird". While taking   atorvastatin and metformin in. Patient reports pain in her heart and pain in her toe. Please advice.  Patient is wanting to see her medical doctor. Please advise.  CB- [phone]

## 2019-08-06 NOTE — Telephone Encounter (Signed)
Pt says that she missed a call from the office and would like to have call turned. Not showing a note in chart.   Please assist.

## 2019-08-06 NOTE — Telephone Encounter (Signed)
Called patient to triage for chest pain.  She states it was side effect of chol med. And has since changed the time of day she was taking and has helped.  Pt will call back to schedule appt with new Dr. Once sch. Opens up.

## 2019-08-27 ENCOUNTER — Ambulatory Visit (INDEPENDENT_AMBULATORY_CARE_PROVIDER_SITE_OTHER): Payer: Medicare HMO | Admitting: Family Medicine

## 2019-08-27 ENCOUNTER — Ambulatory Visit
Admission: RE | Admit: 2019-08-27 | Discharge: 2019-08-27 | Disposition: A | Discharge status: Home / Self Care | Payer: Medicare HMO | Source: Ambulatory Visit | Attending: Family Medicine | Admitting: Family Medicine

## 2019-08-27 ENCOUNTER — Other Ambulatory Visit: Payer: Self-pay

## 2019-08-27 ENCOUNTER — Encounter: Payer: Self-pay | Admitting: Family Medicine

## 2019-08-27 ENCOUNTER — Ambulatory Visit: Payer: Self-pay

## 2019-08-27 VITALS — BP 144/86 | HR 92 | Temp 98.9°F | Resp 14 | Ht 60.0 in | Wt 139.1 lb

## 2019-08-27 DIAGNOSIS — M79641 Pain in right hand: Secondary | ICD-10-CM | POA: Diagnosis not present

## 2019-08-27 DIAGNOSIS — S6991XA Unspecified injury of right wrist, hand and finger(s), initial encounter: Secondary | ICD-10-CM | POA: Insufficient documentation

## 2019-08-27 DIAGNOSIS — S62610A Displaced fracture of proximal phalanx of right index finger, initial encounter for closed fracture: Secondary | ICD-10-CM | POA: Diagnosis not present

## 2019-08-27 NOTE — Progress Notes (Signed)
Patient ID: Kiara Brady, female    DOB: Feb 08, 1946, 74 y.o.   MRN: [id number]  PCP: Arnetha Courser, MD  Chief Complaint  Patient presents with  . Hand Pain    right hand fingers, was sleeping on couch rolled off and hit hand.  Fingers and hand brusied and painful    Subjective:   Kiara Brady is a 74 y.o. female, presents to clinic with CC of the following:  HPI  Patient presents with right hand injury pain and swelling.  Sudden onset occurred last Wednesday, 5 days ago, she states she was sleeping on her couch and her large dog often jumps up on the couch with her she is not sure what happened but she had her arms folded across her chest and she believes she got pushed off the couch falling onto her hands crushing them underneath her body.  She has swelling bruising and pain to the middle of her hand both volar and dorsal aspects from the mid metacarpals to proximal phalanx of 2 3 and 4.  She had wrapped her hand and some paper towels and duct tape to try and immobilize it she states that she cannot move her fingers or hand very well, she can open doors or grab anything and she is right-hand dominant.  She states the swelling and bruising are getting better and she has been applying ice to it.  She denies any deformity, numbness, tingling.  Patient Active Problem List   Diagnosis Date Noted  . Osteoporosis 02/06/2018  . 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 Degenerative arthritis of left knee 07/04/2017  . Hyperlipidemia 03/23/2016  . Cardiac murmur, previously undiagnosed 01/23/2016  . Heart murmur 09/03/2015  . Dyslipidemia associated with type 2 diabetes mellitus ([place]) 02/26/2015  . Diabetes mellitus, type 2 ([place]) 01/06/2015     Current Outpatient Medications:  .  atorvastatin (LIPITOR) 10 MG tablet, TAKE 1 TABLET BY MOUTH ONCE DAILY AT  6PM, Disp: 90 tablet, Rfl: 1 .  metFORMIN (GLUCOPHAGE) 500 MG tablet, Take 1 tablet (500 mg total) by mouth  daily with breakfast., Disp: 90 tablet, Rfl: 1 .  diclofenac sodium (VOLTAREN) 1 % GEL, Apply 2 g topically 2 (two) times daily as needed. (Patient not taking: Reported on 08/27/2019), Disp: 50 g, Rfl: 2 .  ibuprofen (ADVIL,MOTRIN) 200 MG tablet, Take 200 mg by mouth every 6 (six) hours as needed., Disp: , Rfl:    Allergies  Allergen Reactions  . Oxycodone Other (See Comments)  . Penicillin G Hives and Shortness Of Breath  . Penicillins   . Prednisone Cough  . Xanax [Alprazolam]     hyperactivity  . Fosamax [Alendronate Sodium] Nausea Only     Family History  Problem Relation Age of Onset  . Heart disease Mother   . Alzheimer's disease Mother   . Heart attack Mother   . Alcohol abuse Father   . Cancer Maternal Grandmother        breast  . Breast cancer Maternal Grandmother   . Diabetes Sister      Social History   Socioeconomic History  . Marital status: Divorced    Spouse name: Not on file  . Number of children: 2  . Years of education: Not on file  . Highest education level: Master's degree (e.g., MA, MS, MEng, MEd, MSW, MBA)  Occupational History  . Not on file  Tobacco Use  . Smoking status: Former Research scientist (life sciences)  .  Smokeless tobacco: Never Used  Substance and Sexual Activity  . Alcohol use: No  . Drug use: No  . Sexual activity: Not Currently  Other Topics Concern  . Not on file  Social History Narrative   Pt lives alone, daughter is in another country and son lives in [place]   Social Determinants of Health   Financial Resource Strain:   . Difficulty of Paying Living Expenses: Not on file  Food Insecurity:   . Worried About Programme researcher, broadcasting/film/video in the Last Year: Not on file  . Ran Out of Food in the Last Year: Not on file  Transportation Needs:   . Lack of Transportation (Medical): Not on file  . Lack of Transportation (Non-Medical): Not on file  Physical Activity:   . Days of Exercise per Week: Not on file  . Minutes of Exercise per Session: Not on  file  Stress:   . Feeling of Stress : Not on file  Social Connections:   . Frequency of Communication with Friends and Family: Not on file  . Frequency of Social Gatherings with Friends and Family: Not on file  . Attends Religious Services: Not on file  . Active Member of Clubs or Organizations: Not on file  . Attends Banker Meetings: Not on file  . Marital Status: Not on file  Intimate Partner Violence:   . Fear of Current or Ex-Partner: Not on file  . Emotionally Abused: Not on file  . Physically Abused: Not on file  . Sexually Abused: Not on file    Chart Review Today: I personally reviewed active problem list, medication list, allergies, family history, social history, health maintenance, notes from last encounter, lab results, imaging with the patient/caregiver today.   Review of Systems  Constitutional: Negative.   HENT: Negative.   Eyes: Negative.   Respiratory: Negative.   Cardiovascular: Negative.   Gastrointestinal: Negative.   Endocrine: Negative.   Genitourinary: Negative.   Musculoskeletal: Negative.   Skin: Negative.   Allergic/Immunologic: Negative.   Neurological: Negative.   Hematological: Negative.   Psychiatric/Behavioral: Negative.   All other systems reviewed and are negative.      Objective:   Vitals:   08/27/19 1403  BP: (!) 144/86  Pulse: 92  Resp: 14  Temp: 98.9 F (37.2 C)  SpO2: 97%  Weight: 139 lb 1.6 oz (63.1 kg)  Height: 5' (1.524 m)    Body mass index is 27.17 kg/m.  Physical Exam Vitals and nursing note reviewed.  Constitutional:      Appearance: She is well-developed.     Interventions: Face mask in place.  HENT:     Head: Normocephalic and atraumatic.     Right Ear: External ear normal.     Left Ear: External ear normal.  Eyes:     General:        Right eye: No discharge.        Left eye: No discharge.     Conjunctiva/sclera: Conjunctivae normal.  Neck:     Trachea: No tracheal deviation.    Cardiovascular:     Rate and Rhythm: Normal rate and regular rhythm.  Pulmonary:     Effort: Pulmonary effort is normal. No respiratory distress.     Breath sounds: No stridor.  Musculoskeletal:     Right hand: Swelling, tenderness and bony tenderness present. No deformity or lacerations. Decreased range of motion. Normal sensation. Normal capillary refill. Normal pulse.     Comments: Swelling and bruising  bony and soft tissue tenderness to palpation to right hand both on volar and dorsal aspect from mid second third and fourth metacarpal through metacarpal phalangeal joints to proximal phalanx of 2 3 and 4.   Skin:    General: Skin is warm and dry.     Findings: No rash.  Neurological:     Mental Status: She is alert.     Motor: No abnormal muscle tone.     Coordination: Coordination normal.  Psychiatric:        Behavior: Behavior normal. Behavior is cooperative.     Right hand palm  Right hand dorsal aspect Right 4th finger deformity chronic, only swelling and bruising new  Results for orders placed or performed in visit on 02/27/19  HgB A1c  Result Value Ref Range   Hgb A1c MFr Bld 6.1 (H) <5.7 % of total Hgb   Mean Plasma Glucose 128 (calc)   eAG (mmol/L) 7.1 (calc)  Lipid Profile  Result Value Ref Range   Cholesterol 169 <200 mg/dL   HDL 73 > OR = 50 mg/dL   Triglycerides 789 (H) <150 mg/dL   LDL Cholesterol (Calc) 72 mg/dL (calc)   Total CHOL/HDL Ratio 2.3 <5.0 (calc)   Non-HDL Cholesterol (Calc) 96 <381 mg/dL (calc)  Urine Microalbumin w/creat. ratio  Result Value Ref Range   Creatinine, Urine 15 (L) 20 - 275 mg/dL   Microalb, Ur 0.4 mg/dL   Microalb Creat Ratio 27 <30 mcg/mg creat  Hepatitis C Antibody  Result Value Ref Range   Hepatitis C Ab NON-REACTIVE NON-REACTI   SIGNAL TO CUT-OFF 0.11 <1.00  COMPLETE METABOLIC PANEL WITH GFR  Result Value Ref Range   Glucose, Bld 118 (H) 65 - 99 mg/dL   BUN 9 7 - 25 mg/dL   Creat 0.17 (H) 5.10 - 0.93 mg/dL   GFR,  Est Non African American 59 (L) > OR = 60 mL/min/1.57m2   GFR, Est African American 69 > OR = 60 mL/min/1.16m2   BUN/Creatinine Ratio 9 6 - 22 (calc)   Sodium 140 135 - 146 mmol/L   Potassium 3.5 3.5 - 5.3 mmol/L   Chloride 104 98 - 110 mmol/L   CO2 28 20 - 32 mmol/L   Calcium 9.6 8.6 - 10.4 mg/dL   Total Protein 7.3 6.1 - 8.1 g/dL   Albumin 4.6 3.6 - 5.1 g/dL   Globulin 2.7 1.9 - 3.7 g/dL (calc)   AG Ratio 1.7 1.0 - 2.5 (calc)   Total Bilirubin 0.7 0.2 - 1.2 mg/dL   Alkaline phosphatase (APISO) 56 37 - 153 U/L   AST 19 10 - 35 U/L   ALT 14 6 - 29 U/L  HIV Antibody (routine testing w rflx)  Result Value Ref Range   HIV 1&2 Ab, 4th Generation NON-REACTIVE NON-REACTI        Assessment & Plan:      ICD-10-CM   1. Hand injury, right, initial encounter  S69.91XA DG Hand Complete Right  2. Right hand pain  M79.641 DG Hand Complete Right    Right mid hand and finger injury 5 days ago bruising swelling decreased range of motion to right to 3 and 4 metacarpal through proximal phalanx -decreased range of motion, bony tenderness to palpation, patient states swelling and bruising is improving, no gross acute deformity, normal sensation to light touch to all fingers, good capillary refill, will obtain x-rays and refer to hand specialist as indicated otherwise will likely need to get a volar splint or  apply ulnar gutter splint   Xray pending   [name], PA-C 08/27/19 2:23 PM

## 2019-08-27 NOTE — Patient Instructions (Signed)
RICE Therapy for Routine Care of Injuries Many injuries can be cared for with rest, ice, compression, and elevation (RICE therapy). This includes:  Resting the injured part.  Putting ice on the injury.  Putting pressure (compression) on the injury.  Raising the injured part (elevation). Using RICE therapy can help to lessen pain and swelling. Supplies needed:  Ice.  Plastic bag.  Towel.  Elastic bandage.  Pillow or pillows to raise (elevate) your injured body part. How to care for your injury with RICE therapy Rest Limit your normal activities, and try not to use the injured part of your body. You can go back to your normal activities when your doctor says it is okay to do them and you feel okay. Ask your doctor if you should do exercises to help your injury get better. Ice Put ice on the injured area. Do not put ice on your bare skin.  Put ice in a plastic bag.  Place a towel between your skin and the bag.  Leave the ice on for 20 minutes, 2-3 times a day. Use ice on as many days as told by your doctor.  Compression Compression means putting pressure on the injured area. This can be done with an elastic bandage. If an elastic bandage has been put on your injury:  Do not wrap the bandage too tight. Wrap the bandage more loosely if part of your body away from the bandage is blue, swollen, cold, painful, or loses feeling (gets numb).  Take off the bandage and put it on again. Do this every 3-4 hours or as told by your doctor.  See your doctor if the bandage seems to make your problems worse.  Elevation Elevation means keeping the injured area raised. If you can, raise the injured area above your heart or the center of your chest. Contact a doctor if:  You keep having pain and swelling.  Your symptoms get worse. Get help right away if:  You have sudden bad pain at your injury or lower than your injury.  You have redness or more swelling around your injury.  You  have tingling or numbness at your injury or lower than your injury, and it does not go away when you take off the bandage. Summary  Many injuries can be cared for using rest, ice, compression, and elevation (RICE therapy).  You can go back to your normal activities when you feel okay and your doctor says it is okay.  Put ice on the injured area as told by your doctor.  Get help if your symptoms get worse or if you keep having pain and swelling. This information is not intended to replace advice given to you by your health care provider. Make sure you discuss any questions you have with your health care provider. Document Revised: 04/08/2017 Document Reviewed: 04/08/2017 Elsevier Patient Education  [employer].   Hand Contusion A hand contusion is a deep bruise to the hand. Deep bruises can happen when an injury causes bleeding under the skin. The skin over the bruise may be red and then turn blue, purple, or yellow. A minor injury may cause a deep bruise that is painless. A very bad injury may cause a deep bruise that stays painful and swollen for a few weeks. What are the causes?  A hard hit or direct force to your hand, such as having a heavy object fall on your hand. What are the signs or symptoms?  A swollen hand.  Pain and  tenderness in your hand.  Discoloration of your hand. The area may have redness. Then, it may turn: ? Blue. ? Purple. ? Yellow. How is this treated?  Doing RICE therapy. This includes: ? Rest. ? Ice. ? Pressure (compression). ? Raising (elevating) the injured area.  Using an elastic wrap to support your hand.  Taking over-the-counter medicines to control pain. Follow these instructions at home: RICE therapy  Rest the injured area.  If told, put ice on the injured area. ? Put ice in a plastic bag. ? Place a towel between your skin and the bag. ? Leave the ice on for 20 minutes, 2-3 times a day.  If told, put light pressure on the  injured area using an elastic wrap. ? Make sure the wrap is not too tight. ? Take the wrap off and put it back on more loosely if your fingers:  Get numb.  Turn cold.  Turn blue. ? Remove and put the wrap back on as told by your doctor.  Raise (elevate) the injured area above the level of your heart while you are sitting or lying down. General instructions  Take over-the-counter and prescription medicines only as told by your doctor.  Protect your hand from getting more injured.  Keep all follow-up visits as told by your doctor. This is important. Contact a health care provider if:  Your symptoms do not get better after several days of treatment.  Your hand or fingers have more: ? Redness. ? Swelling. ? Pain.  You have trouble moving the injured area.  Medicine does not help your swelling or pain. Get help right away if:  You have very bad pain.  Your hand or fingers are numb.  Your hand or fingers turn: ? Very light (pale). ? Blue. ? Cold.  You cannot move your hand or wrist.  Your hand feels warm when you touch it. Summary  A hand contusion is a deep bruise to the hand.  Deep bruises can happen when an injury causes bleeding under the skin.  This injury is treated with rest, ice, pressure (compression), and elevation. This information is not intended to replace advice given to you by your health care provider. Make sure you discuss any questions you have with your health care provider. Document Revised: 05/17/2018 Document Reviewed: 05/17/2018 Elsevier Patient Education  2020 [employer].

## 2019-08-27 NOTE — Telephone Encounter (Signed)
Pt. Reports she fell last Wednesday, "off of my cough and I hurt my right hand." Used ice. Hand is swollen and bruised. "Hurts to use it." Appointment made for today.  Reason for Disposition . Can't use injured hand normally (e.g., make a fist, open fully, hold a glass of water)  Answer Assessment - Initial Assessment Questions 1. MECHANISM: "How did the injury happen?"     Fell off couch 2. ONSET: "When did the injury happen?" (Minutes or hours ago)      Last Wednesday 3. APPEARANCE of INJURY: "What does the injury look like?"      Hand swollen 4. SEVERITY: "Can you use the hand normally?" "Can you bend your fingers into a ball and then fully open them?"     No 5. SIZE: For cuts, bruises, or swelling, ask: "How large is it?" (e.g., inches or centimeters;  entire hand or wrist)      No cuts 6. PAIN: "Is there pain?" If so, ask: "How bad is the pain?"  (Scale 1-10; or mild, moderate, severe)     9 7. TETANUS: For any breaks in the skin, ask: "When was the last tetanus booster?"     Unsure 8. OTHER SYMPTOMS: "Do you have any other symptoms?"      Bruised 9. PREGNANCY: "Is there any chance you are pregnant?" "When was your last menstrual period?"     No  Protocols used: HAND AND WRIST INJURY-A-AH

## 2019-08-28 ENCOUNTER — Other Ambulatory Visit: Payer: Self-pay | Admitting: Family Medicine

## 2019-08-28 ENCOUNTER — Telehealth: Payer: Self-pay | Admitting: Family Medicine

## 2019-08-28 DIAGNOSIS — S62640A Nondisplaced fracture of proximal phalanx of right index finger, initial encounter for closed fracture: Secondary | ICD-10-CM

## 2019-08-28 DIAGNOSIS — S62642A Nondisplaced fracture of proximal phalanx of right middle finger, initial encounter for closed fracture: Secondary | ICD-10-CM

## 2019-08-28 NOTE — Telephone Encounter (Signed)
Copied from CRM [id number]. Topic: General - Other >> Aug 28, 2019 11:55 AM Tamela Oddi wrote: Reason for CRM: Patient is returning a call to the nurse regarding her x-ray.  Please call back to discuss at [phone]

## 2019-08-28 NOTE — Telephone Encounter (Signed)
Pt notified, will call her back with hand referral

## 2019-08-29 ENCOUNTER — Other Ambulatory Visit: Payer: Self-pay

## 2019-08-29 DIAGNOSIS — E1169 Type 2 diabetes mellitus with other specified complication: Secondary | ICD-10-CM

## 2019-08-29 MED ORDER — ATORVASTATIN CALCIUM 10 MG PO TABS: ORAL_TABLET | ORAL | 0 refills | Status: DC

## 2019-08-31 ENCOUNTER — Other Ambulatory Visit: Payer: Self-pay

## 2019-08-31 ENCOUNTER — Encounter: Payer: Self-pay | Admitting: Family Medicine

## 2019-08-31 ENCOUNTER — Ambulatory Visit (INDEPENDENT_AMBULATORY_CARE_PROVIDER_SITE_OTHER): Payer: Medicare HMO | Admitting: Family Medicine

## 2019-08-31 ENCOUNTER — Other Ambulatory Visit: Payer: Self-pay | Admitting: Family Medicine

## 2019-08-31 VITALS — BP 122/72 | HR 76 | Temp 97.3°F | Resp 20 | Ht 62.0 in | Wt 137.7 lb

## 2019-08-31 DIAGNOSIS — E785 Hyperlipidemia, unspecified: Secondary | ICD-10-CM

## 2019-08-31 DIAGNOSIS — E119 Type 2 diabetes mellitus without complications: Secondary | ICD-10-CM

## 2019-08-31 DIAGNOSIS — E1169 Type 2 diabetes mellitus with other specified complication: Secondary | ICD-10-CM

## 2019-08-31 DIAGNOSIS — M810 Age-related osteoporosis without current pathological fracture: Secondary | ICD-10-CM

## 2019-08-31 DIAGNOSIS — E782 Mixed hyperlipidemia: Secondary | ICD-10-CM | POA: Diagnosis not present

## 2019-08-31 DIAGNOSIS — Z5181 Encounter for therapeutic drug level monitoring: Secondary | ICD-10-CM

## 2019-08-31 MED ORDER — METFORMIN HCL 500 MG PO TABS: 500.0000 mg | ORAL_TABLET | Freq: Every day | ORAL | 1 refills | Status: DC

## 2019-08-31 MED ORDER — ATORVASTATIN CALCIUM 10 MG PO TABS: 10.0000 mg | ORAL_TABLET | Freq: Every day | ORAL | 3 refills | Status: DC

## 2019-08-31 NOTE — Patient Instructions (Addendum)
Your blood sugars and diabetes have been really well controlled, continue to take your medicines and try and continue to have a healthy diet.    You will get a call from the eye doctor to check up on your eyes

## 2019-08-31 NOTE — Progress Notes (Signed)
Name: Kiara Brady   MRN: [id number]    DOB: 1946/05/19   Date:08/31/2019       Progress Note  Chief Complaint  Patient presents with  . Follow-up  . Diabetes  . Hyperlipidemia     Subjective:   Kiara Brady is a 74 y.o. female, presents to clinic for routine follow up on the conditions listed above. Diabetes Mellitus Type II: Currently managing with metformin 500 mg BID Pt notes intermittent med compliance -she was having some GI symptoms so she went down to 1 dose daily for a while but now is taking twice daily again No hypoglycemic episodes - not checking blood sugar at all Denies: Polyuria, polydipsia, polyphagia, vision changes, or neuropathy  Recent pertinent labs: Lab Results  Component Value Date   HGBA1C 6.1 (H) 02/27/2019   HGBA1C 5.9 (H) 06/22/2018   HGBA1C 5.7 (H) 12/19/2017      Component Value Date/Time   NA 140 02/27/2019 1530   NA 144 09/08/2015 1053   K 3.5 02/27/2019 1530   CL 104 02/27/2019 1530   CO2 28 02/27/2019 1530   GLUCOSE 118 (H) 02/27/2019 1530   BUN 9 02/27/2019 1530   BUN 10 09/08/2015 1053   CREATININE 0.95 (H) 02/27/2019 1530   CALCIUM 9.6 02/27/2019 1530   PROT 7.3 02/27/2019 1530   PROT 7.3 09/08/2015 1053   ALBUMIN 3.9 09/28/2016 1015   ALBUMIN 4.4 09/08/2015 1053   AST 19 02/27/2019 1530   ALT 14 02/27/2019 1530   ALKPHOS 60 09/28/2016 1015   BILITOT 0.7 02/27/2019 1530   BILITOT 0.6 09/08/2015 1053   GFRNONAA 59 (L) 02/27/2019 1530   GFRAA 69 02/27/2019 1530    Current diet: in general, a "healthy" diet   vegetable and rice, tries to avoid sweets but she likes chocolate Current exercise: none  Not UTD on DM foot exam and eye exam - doing and referring today ACEI/ARB: No - she doesn't know if she used to be on BP med, no allergies noted Statin: Yes  Hyperlipidemia: Current Medication Regimen:  Atorvastatin 10 mg Last Lipids: Lab Results  Component Value Date   CHOL 169 02/27/2019   HDL 73 02/27/2019   LDLCALC 72 02/27/2019   TRIG 165 (H) 02/27/2019   CHOLHDL 2.3 02/27/2019   - Current Diet:see above -mainly eats seafood, fish and chicken with vegetables and rice rarely red meats - Denies: Chest pain, shortness of breath, myalgias. - Documented aortic atherosclerosis? No - Risk factors for atherosclerosis: diabetes mellitus and hypercholesterolemia  Patient's right hand and finger pain that she was seen for earlier this week, there were multiple fractures.  We arranged for an immediate orthopedic appointment and patient states that she went but she did not want to see a PA because she had already seen a PA and she rescheduled for next week to see the physician.  She continues to have her hand wrapped napkins and duct tape and states that it is getting better with improved swelling and bruising but she still cannot move her fingers at the MCP joint.  I explained that she needs to go as soon as she can to get the orthopedic evaluation and assessment and have her hand/finger immobilized.  She was surprised and continue to show me how much she could move it and how she could sip of her zipper if she used her pinky in her thumb, she stated that cold water helped it feel better while she was doing the dishes.  She states she has an appointment next Tuesday     Patient Active Problem List   Diagnosis Date Noted  . Osteoporosis 02/06/2018  . 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 Degenerative arthritis of left knee 07/04/2017  . Hyperlipidemia 03/23/2016  . Cardiac murmur, previously undiagnosed 01/23/2016  . Heart murmur 09/03/2015  . Dyslipidemia associated with type 2 diabetes mellitus (HCC) 02/26/2015  . Diabetes mellitus, type 2 (HCC) 01/06/2015    Past Surgical History:  Procedure Laterality Date  . VARICOSE VEIN SURGERY      Family History  Problem Relation Age of Onset  . Heart disease Mother   . Alzheimer's disease Mother   . Heart attack Mother   .  Alcohol abuse Father   . Cancer Maternal Grandmother        breast  . Breast cancer Maternal Grandmother   . Diabetes Sister     Social History   Socioeconomic History  . Marital status: Divorced    Spouse name: Not on file  . Number of children: 2  . Years of education: Not on file  . Highest education level: Master's degree (e.g., MA, MS, MEng, MEd, MSW, MBA)  Occupational History  . Not on file  Tobacco Use  . Smoking status: Former Games developer  . Smokeless tobacco: Never Used  Substance and Sexual Activity  . Alcohol use: No  . Drug use: No  . Sexual activity: Not Currently  Other Topics Concern  . Not on file  Social History Narrative   Pt lives alone, daughter is in another country and son lives in [place]   Social Determinants of Health   Financial Resource Strain:   . Difficulty of Paying Living Expenses: Not on file  Food Insecurity:   . Worried About Programme researcher, broadcasting/film/video in the Last Year: Not on file  . Ran Out of Food in the Last Year: Not on file  Transportation Needs:   . Lack of Transportation (Medical): Not on file  . Lack of Transportation (Non-Medical): Not on file  Physical Activity:   . Days of Exercise per Week: Not on file  . Minutes of Exercise per Session: Not on file  Stress:   . Feeling of Stress : Not on file  Social Connections:   . Frequency of Communication with Friends and Family: Not on file  . Frequency of Social Gatherings with Friends and Family: Not on file  . Attends Religious Services: Not on file  . Active Member of Clubs or Organizations: Not on file  . Attends Banker Meetings: Not on file  . Marital Status: Not on file  Intimate Partner Violence:   . Fear of Current or Ex-Partner: Not on file  . Emotionally Abused: Not on file  . Physically Abused: Not on file  . Sexually Abused: Not on file     Current Outpatient Medications:  .  atorvastatin (LIPITOR) 10 MG tablet, TAKE 1 TABLET BY MOUTH ONCE DAILY AT   6PM, Disp: 30 tablet, Rfl: 0 .  metFORMIN (GLUCOPHAGE) 500 MG tablet, Take 1 tablet (500 mg total) by mouth daily with breakfast., Disp: 90 tablet, Rfl: 1 .  ibuprofen (ADVIL,MOTRIN) 200 MG tablet, Take 200 mg by mouth every 6 (six) hours as needed., Disp: , Rfl:   Allergies  Allergen Reactions  . Oxycodone Other (See Comments)  . Penicillin G Hives and Shortness Of Breath  . Penicillins   . Prednisone Cough  . Xanax [  Alprazolam]     hyperactivity  . Fosamax [Alendronate Sodium] Nausea Only    Chart Review Today: I personally reviewed active problem list, medication list, allergies, family history, social history, health maintenance, notes from last encounter, lab results, imaging with the patient/caregiver today.  Review of Systems  Constitutional: Negative.   HENT: Negative.   Eyes: Negative.   Respiratory: Negative.   Cardiovascular: Negative.   Gastrointestinal: Negative.   Endocrine: Negative.   Genitourinary: Negative.   Musculoskeletal: Negative.   Skin: Negative.   Allergic/Immunologic: Negative.   Neurological: Negative.   Hematological: Negative.   Psychiatric/Behavioral: Negative.   All other systems reviewed and are negative.    Objective:    Vitals:   08/31/19 1500  BP: 122/72  Pulse: 76  Resp: 20  Temp: (!) 97.3 F (36.3 C)  TempSrc: Temporal  SpO2: 96%  Weight: 137 lb 11.2 oz (62.5 kg)  Height: 5\' 2"  (1.575 m)    Body mass index is 25.19 kg/m.  Physical Exam Vitals and nursing note reviewed.  Constitutional:      General: She is not in acute distress.    Appearance: Normal appearance. She is well-developed. She is not ill-appearing, toxic-appearing or diaphoretic.     Interventions: Face mask in place.  HENT:     Head: Normocephalic and atraumatic.     Right Ear: External ear normal.     Left Ear: External ear normal.  Eyes:     General: Lids are normal. No scleral icterus.       Right eye: No discharge.        Left eye: No discharge.      Conjunctiva/sclera: Conjunctivae normal.  Neck:     Trachea: Phonation normal. No tracheal deviation.  Cardiovascular:     Rate and Rhythm: Normal rate and regular rhythm.     Pulses: Normal pulses.          Radial pulses are 2+ on the right side and 2+ on the left side.       Posterior tibial pulses are 2+ on the right side and 2+ on the left side.     Heart sounds: Normal heart sounds. No murmur. No friction rub. No gallop.   Pulmonary:     Effort: Pulmonary effort is normal. No respiratory distress.     Breath sounds: Normal breath sounds. No stridor. No wheezing, rhonchi or rales.  Chest:     Chest wall: No tenderness.  Abdominal:     General: Bowel sounds are normal. There is no distension.     Palpations: Abdomen is soft.     Tenderness: There is no abdominal tenderness. There is no guarding or rebound.  Musculoskeletal:        General: Swelling and tenderness present.     Cervical back: Normal range of motion and neck supple.  Lymphadenopathy:     Cervical: No cervical adenopathy.  Skin:    General: Skin is warm and dry.     Capillary Refill: Capillary refill takes less than 2 seconds.     Coloration: Skin is not jaundiced or pale.     Findings: No rash.  Neurological:     Mental Status: She is alert and oriented to person, place, and time.     Motor: No abnormal muscle tone.     Gait: Gait normal.  Psychiatric:        Mood and Affect: Mood normal.        Speech: Speech normal.  Behavior: Behavior normal.       Diabetic Foot Exam: Diabetic Foot Exam - Simple   Simple Foot Form Diabetic Foot exam was performed with the following findings: Yes 08/31/2019  3:28 PM  Visual Inspection Sensation Testing Pulse Check Comments     PHQ2/9: Depression screen [facility] 2/9 08/31/2019 08/27/2019 05/17/2019 02/27/2019 09/04/2018  Decreased Interest 0 0 0 0 0  Down, Depressed, Hopeless 0 0 0 1 0  PHQ - 2 Score 0 0 0 1 0  Altered sleeping 0 0 0 0 -  Tired, decreased  energy 0 0 0 0 -  Change in appetite 0 0 0 0 -  Feeling bad or failure about yourself  0 0 0 0 -  Trouble concentrating 3 0 0 0 -  Moving slowly or fidgety/restless 0 0 0 0 -  Suicidal thoughts 0 0 0 0 -  PHQ-9 Score 3 0 0 1 -  Difficult doing work/chores Somewhat difficult Not difficult at all Not difficult at all Not difficult at all -  Some recent data might be hidden    phq 9 is neg, less than 4, reviewed today  Fall Risk: Fall Risk  08/31/2019 08/27/2019 05/17/2019 02/27/2019 09/04/2018  Falls in the past year? 1 0 0 0 0  Number falls in past yr: 1 0 0 0 0  Injury with Fall? 1 0 0 0 0  Follow up - - Falls evaluation completed - -    Functional Status Survey: Is the patient deaf or have difficulty hearing?: No Does the patient have difficulty seeing, even when wearing glasses/contacts?: No Does the patient have difficulty concentrating, remembering, or making decisions?: Yes(sometimes trouble with remembering) Does the patient have difficulty walking or climbing stairs?: Yes(knees hurt) Does the patient have difficulty dressing or bathing?: No Does the patient have difficulty doing errands alone such as visiting a doctor's office or shopping?: No   Assessment & Plan:     ICD-10-CM   1. Type 2 diabetes mellitus without complication, without long-term current use of insulin (HCC)  E11.9 metFORMIN (GLUCOPHAGE) 500 MG tablet    atorvastatin (LIPITOR) 10 MG tablet   hx of well controlled dm, recheck labs, 6 month f/up unless lab indicated worsening/uncontrolled dm  2. Dyslipidemia associated with type 2 diabetes mellitus (HCC)  E11.69 metFORMIN (GLUCOPHAGE) 500 MG tablet   E78.5 atorvastatin (LIPITOR) 10 MG tablet   on statin, no myalgias, recheck labs     Return in about 6 months (around 02/28/2020) for Routine follow-up for DM, HLD.   [name], PA-C 08/31/19 3:27 PM

## 2019-09-01 LAB — CBC WITH DIFFERENTIAL/PLATELET
Absolute Monocytes: 561 cells/uL (ref 200–950)
Basophils Absolute: 29 cells/uL (ref 0–200)
Basophils Relative: 0.3 %
Eosinophils Absolute: 190 cells/uL (ref 15–500)
Eosinophils Relative: 2 %
HCT: 39.4 % (ref 35.0–45.0)
Hemoglobin: 13.4 g/dL (ref 11.7–15.5)
Lymphs Abs: 3059 cells/uL (ref 850–3900)
MCH: 31.4 pg (ref 27.0–33.0)
MCHC: 34 g/dL (ref 32.0–36.0)
MCV: 92.3 fL (ref 80.0–100.0)
MPV: 9.9 fL (ref 7.5–12.5)
Monocytes Relative: 5.9 %
Neutro Abs: 5662 cells/uL (ref 1500–7800)
Neutrophils Relative %: 59.6 %
Platelets: 365 10*3/uL (ref 140–400)
RBC: 4.27 10*6/uL (ref 3.80–5.10)
RDW: 11.8 % (ref 11.0–15.0)
Total Lymphocyte: 32.2 %
WBC: 9.5 10*3/uL (ref 3.8–10.8)

## 2019-09-01 LAB — LIPID PANEL
Cholesterol: 152 mg/dL (ref <200)
HDL: 65 mg/dL (ref >=50)
LDL Cholesterol (Calc): 64 mg/dL (calc)
Non-HDL Cholesterol (Calc): 87 mg/dL (calc) (ref <130)
Total CHOL/HDL Ratio: 2.3 (calc) (ref <5.0)
Triglycerides: 142 mg/dL (ref <150)

## 2019-09-01 LAB — COMPLETE METABOLIC PANEL WITH GFR
AG Ratio: 1.5 (calc) (ref 1.0–2.5)
ALT: 20 U/L (ref 6–29)
AST: 21 U/L (ref 10–35)
Albumin: 4.6 g/dL (ref 3.6–5.1)
Alkaline phosphatase (APISO): 89 U/L (ref 37–153)
BUN/Creatinine Ratio: 9 (calc) (ref 6–22)
BUN: 8 mg/dL (ref 7–25)
CO2: 30 mmol/L (ref 20–32)
Calcium: 10.3 mg/dL (ref 8.6–10.4)
Chloride: 103 mmol/L (ref 98–110)
Creat: 0.94 mg/dL — ABNORMAL HIGH (ref 0.60–0.93)
GFR, Est African American: 69 mL/min/{1.73_m2} (ref >=60)
GFR, Est Non African American: 60 mL/min/{1.73_m2} (ref >=60)
Globulin: 3.1 g/dL (calc) (ref 1.9–3.7)
Glucose, Bld: 119 mg/dL — ABNORMAL HIGH (ref 65–99)
Potassium: 3.6 mmol/L (ref 3.5–5.3)
Sodium: 143 mmol/L (ref 135–146)
Total Bilirubin: 0.6 mg/dL (ref 0.2–1.2)
Total Protein: 7.7 g/dL (ref 6.1–8.1)

## 2019-09-01 LAB — HEMOGLOBIN A1C
Hgb A1c MFr Bld: 6.8 % of total Hgb — ABNORMAL HIGH (ref <5.7)
Mean Plasma Glucose: 148 (calc)
eAG (mmol/L): 8.2 (calc)

## 2019-09-01 LAB — VITAMIN D 25 HYDROXY (VIT D DEFICIENCY, FRACTURES): Vit D, 25-Hydroxy: 20 ng/mL — ABNORMAL LOW (ref 30–100)

## 2019-09-04 DIAGNOSIS — M65322 Trigger finger, left index finger: Secondary | ICD-10-CM | POA: Diagnosis not present

## 2019-09-04 DIAGNOSIS — M65312 Trigger thumb, left thumb: Secondary | ICD-10-CM | POA: Diagnosis not present

## 2019-09-04 DIAGNOSIS — S62600D Fracture of unspecified phalanx of right index finger, subsequent encounter for fracture with routine healing: Secondary | ICD-10-CM | POA: Diagnosis not present

## 2019-09-04 MED ORDER — VITAMIN D (ERGOCALCIFEROL) 1.25 MG (50000 UNIT) PO CAPS: 50000.0000 [IU] | ORAL_CAPSULE | ORAL | 0 refills | Status: DC

## 2019-09-25 DIAGNOSIS — M65312 Trigger thumb, left thumb: Secondary | ICD-10-CM | POA: Diagnosis not present

## 2019-09-25 DIAGNOSIS — S62600D Fracture of unspecified phalanx of right index finger, subsequent encounter for fracture with routine healing: Secondary | ICD-10-CM | POA: Diagnosis not present

## 2019-11-09 ENCOUNTER — Ambulatory Visit: Payer: Self-pay | Admitting: *Deleted

## 2019-11-09 NOTE — Telephone Encounter (Signed)
Patient is calling to report LLQ abdominal pain that comes and goes and is relieved by moving bowels. Patient states he does have any other symptoms except for fatigue and she does not think that is related. Patient states she has had these symptoms for over 1 month. Call to office- no opening with provider patient wants to see. She is insistent office request note for provider review and call back for appointment.Patient advised to go to ED for increased pain- longer lasting pain, changes in bowels , fever. Patient states she understands. Reason for Disposition . Age > 60 years  Answer Assessment - Initial Assessment Questions 1. LOCATION: "Where does it hurt?"      Abdominal pain- LLQ 2. RADIATION: "Does the pain shoot anywhere else?" (e.g., chest, back)     No radiation 3. ONSET: "When did the pain begin?" (e.g., minutes, hours or days ago)      1 month 4. SUDDEN: "Gradual or sudden onset?"     sudden 5. PATTERN "Does the pain come and go, or is it constant?"    - If constant: "Is it getting better, staying the same, or worsening?"      (Note: Constant means the pain never goes away completely; most serious pain is constant and it progresses)     - If intermittent: "How long does it last?" "Do you have pain now?"     (Note: Intermittent means the pain goes away completely between bouts)     Comes and goes 6. SEVERITY: "How bad is the pain?"  (e.g., Scale 1-10; mild, moderate, or severe)   - MILD (1-3): doesn't interfere with normal activities, abdomen soft and not tender to touch    - MODERATE (4-7): interferes with normal activities or awakens from sleep, tender to touch    - SEVERE (8-10): excruciating pain, doubled over, unable to do any normal activities      5-6 7. RECURRENT SYMPTOM: "Have you ever had this type of abdominal pain before?" If so, ask: "When was the last time?" and "What happened that time?"      no 8. CAUSE: "What do you think is causing the abdominal pain?"  Eating habits 9. RELIEVING/AGGRAVATING FACTORS: "What makes it better or worse?" (e.g., movement, antacids, bowel movement)     Having bowel movement 10. OTHER SYMPTOMS: "Has there been any vomiting, diarrhea, constipation, or urine problems?"       no 11. PREGNANCY: "Is there any chance you are pregnant?" "When was your last menstrual period?"       n/a  Protocols used: ABDOMINAL PAIN - [facility]

## 2019-11-09 NOTE — Telephone Encounter (Signed)
Called patient to inform her to go to [facility]. Patient did not answer and it stated mailbox was full .

## 2019-11-14 NOTE — Progress Notes (Signed)
Patient ID: Kiara Brady, female    DOB: 03-Jul-1946, 74 y.o.   MRN: [id number]  PCP: [name], PA-C  Chief Complaint  Patient presents with  . Follow-up  . Knee Pain    Bilateral  . Shoulder Pain    Bilateral  . Abdominal Pain    lower abdomen pain before bowel movements and after it stops    Subjective:   Kiara Brady is a 74 y.o. female, presents to clinic with CC of the following:  Chief Complaint  Patient presents with  . Follow-up  . Knee Pain    Bilateral  . Shoulder Pain    Bilateral  . Abdominal Pain    lower abdomen pain before bowel movements and after it stops    HPI:  Patient is a 74 year old female patient of [name] whose last routine follow-up was in January 2021 for her DM, hyperlipidemia and concern for hand fractures noted at that visit.  She did subsequently see orthopedics for further management.    She called on 11/09/2019 with the following message form that call reviewed: Patient is calling to report LLQ abdominal pain that comes and goes and is relieved by moving bowels. Patient states he does have any other symptoms except for fatigue and she does not think that is related. Patient states she has had these symptoms for over 1 month. Call to office- no opening with provider patient wants to see. She is insistent office request note for provider review and call back for appointment.Patient advised to go to ED for increased pain- longer lasting pain, changes in bowels , fever. Patient states she understands.   She follows up today for the above and with complaints of bilateral knee pain and shoulder pain.  She noted she wanted a colonoscopy and an MRI, and called her insurance company, and they stated she would need to be seen by a physician.  She notes the pain in her abdomen is only after she is very active and her muscles hurt such as after mowing her lawn, which she does little parts of it over 3 days, or doing household work.  When her  muscles hurt, she feels pain in her lower abdomen, across the beltline area, and more left-sided, although occasionally can be across to the right side.  She states after she has a bowel movement, the pain goes away.  She denied any recent change in bowel habits, with no black stools or bleeding, denied constipation/diarrhea concerns, no recent fevers.  She questioned if this was related to her Metformin.  She does not have that pain presently.  She has seen orthopedist in the past for her knee pain, had injections, which worked well initially, although over time, they were less helpful.  Had x-rays done and were consistent with bilateral knee osteoarthritis.  She had Euflexxa injections given without much help noted.  She notes she feels the pain on the inside of her knees, where her larger bones are sticking out some, and sometimes problematic when she is walking.  They do not get stuck.  She occasionally tries to apply cold, sometimes heat, and is careful with trying to take any medications.  She was on Fosamax for osteoporosis, but could not take it due to nausea.  Also could not tolerate vitamin D products.  Her last bone scan was reviewed and was consistent with osteoporosis.  She did not have a trauma before this increase occurred.  The shoulder pains are  not necessarily directly at the shoulders, and she described as more muscle aches surrounding the shoulders and involving the trap muscle complexes bilaterally as well.  This seems to be more problematic after she is doing her activities like household work and mowing the lawn, and no symptoms down the arms or dropping things.  Patient Active Problem List   Diagnosis Date Noted  . Osteoporosis 02/06/2018  . 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 Degenerative arthritis of left knee 07/04/2017  . Hyperlipidemia 03/23/2016  . Cardiac murmur, previously undiagnosed 01/23/2016  . Heart murmur 09/03/2015  . Dyslipidemia  associated with type 2 diabetes mellitus (HCC) 02/26/2015  . Diabetes mellitus, type 2 (HCC) 01/06/2015      Current Outpatient Medications:  .  atorvastatin (LIPITOR) 10 MG tablet, Take 1 tablet (10 mg total) by mouth at bedtime., Disp: 90 tablet, Rfl: 3 .  ibuprofen (ADVIL,MOTRIN) 200 MG tablet, Take 200 mg by mouth every 6 (six) hours as needed., Disp: , Rfl:  .  metFORMIN (GLUCOPHAGE) 500 MG tablet, Take 1 tablet (500 mg total) by mouth daily with breakfast., Disp: 90 tablet, Rfl: 1 .  Vitamin D, Ergocalciferol, (DRISDOL) 1.25 MG (50000 UNIT) CAPS capsule, Take 1 capsule (50,000 Units total) by mouth every 7 (seven) days. x12 weeks., Disp: 12 capsule, Rfl: 0   Allergies  Allergen Reactions  . Oxycodone Other (See Comments)  . Penicillin G Hives and Shortness Of Breath  . Penicillins   . Prednisone Cough  . Xanax [Alprazolam]     hyperactivity  . Fosamax [Alendronate Sodium] Nausea Only     Past Surgical History:  Procedure Laterality Date  . VARICOSE VEIN SURGERY       Family History  Problem Relation Age of Onset  . Heart disease Mother   . Alzheimer's disease Mother   . Heart attack Mother   . Alcohol abuse Father   . Cancer Maternal Grandmother        breast  . Breast cancer Maternal Grandmother   . Diabetes Sister      Social History   Tobacco Use  . Smoking status: Former Games developer  . Smokeless tobacco: Never Used  Substance Use Topics  . Alcohol use: No    With staff assistance, above reviewed with the patient today.  ROS: As per HPI, otherwise no specific complaints on a limited and focused system review   No results found for this or any previous visit (from the past 72 hour(s)).   PHQ2/9: Depression screen [facility] 2/9 11/15/2019 08/31/2019 08/27/2019 05/17/2019 02/27/2019  Decreased Interest 0 0 0 0 0  Down, Depressed, Hopeless 0 0 0 0 1  PHQ - 2 Score 0 0 0 0 1  Altered sleeping 0 0 0 0 0  Tired, decreased energy 2 0 0 0 0  Change in appetite 0 0  0 0 0  Feeling bad or failure about yourself  0 0 0 0 0  Trouble concentrating 0 3 0 0 0  Moving slowly or fidgety/restless 0 0 0 0 0  Suicidal thoughts 0 0 0 0 0  PHQ-9 Score 2 3 0 0 1  Difficult doing work/chores Not difficult at all Somewhat difficult Not difficult at all Not difficult at all Not difficult at all  Some recent data might be hidden   PHQ-2/9 Result is neg  Fall Risk: Fall Risk  11/15/2019 08/31/2019 08/27/2019 05/17/2019 02/27/2019  Falls in the past year? 1 1 0 0 0  Number falls in past yr: 0 1 0 0 0  Injury with Fall? 1 1 0 0 0  Follow up - - - Falls evaluation completed -      Objective:   Vitals:   11/15/19 1415  BP: 108/78  Pulse: 80  Resp: 16  Temp: 97.9 F (36.6 C)  TempSrc: Temporal  SpO2: 97%  Weight: 134 lb 12.8 oz (61.1 kg)  Height: 5\' 2"  (1.575 m)    Body mass index is 24.66 kg/m.  Physical Exam   NAD, masked HEENT - [place]/AT, sclera anicteric, conj - non-inj'ed, Neck - supple, range of motion was adequate. Car - RRR  Abd - soft, NT diffusely, no rebound or guarding, ND, BS+,  no masses, no obvious hepatosplenomegaly Back - no CVA tenderness Ext - no LE edema,  Shoulders -more detailed exam limited today, have adequate range of motion of her shoulders  L Knee - ROM mildly limited at the extreme of flexion due to discomfort medially,could fully extend,   Mild crepitus on ROM testing  Questioned a mild joint effusion, not marked  No bruising  NT suprapatella bursa region  Tender diffusely on the medial portion of the knee, below the kneecap and more distal towards the proximal tibia region, no focal medial joint line tenderness.  No lateral joint line tenderness with palpation  Med and lat collaterals intact on testing, no pain with testing  Lachman neg  McMurrays causes some mild discomfort  No pain posterior,  R Knee - ROM not markedly limited, with some hesitancy at the extreme of flexion more than marked pain limiting, could fully  extend  No marked crepitus on ROM testing  No effusion,   No bruising  NT suprapatella bursa region  Tender diffusely on the medial portion of the knee, below the kneecap and more distal towards the proximal tibia region, no focal medial joint line tenderness (just like left knee)  No lateral joint line tenderness with palpation  Med and lat collaterals intact on testing, no pain with testing  Lachman neg  McMurrays causes some mild discomfort  No pain posterior,    Neuro/psychiatric - affect was not flat, appropriate with conversation with may questions asked today  Alert and oriented  Speech  normal   Results for orders placed or performed in visit on 08/31/19  CBC with Differential/Platelet  Result Value Ref Range   WBC 9.5 3.8 - 10.8 Thousand/uL   RBC 4.27 3.80 - 5.10 Million/uL   Hemoglobin 13.4 11.7 - 15.5 g/dL   HCT 39.4 35.0 - 45.0 %   MCV 92.3 80.0 - 100.0 fL   MCH 31.4 27.0 - 33.0 pg   MCHC 34.0 32.0 - 36.0 g/dL   RDW 11.8 11.0 - 15.0 %   Platelets 365 140 - 400 Thousand/uL   MPV 9.9 7.5 - 12.5 fL   Neutro Abs 5,662 1,500 - 7,800 cells/uL   Lymphs Abs 3,059 850 - 3,900 cells/uL   Absolute Monocytes 561 200 - 950 cells/uL   Eosinophils Absolute 190 15 - 500 cells/uL   Basophils Absolute 29 0 - 200 cells/uL   Neutrophils Relative % 59.6 %   Total Lymphocyte 32.2 %   Monocytes Relative 5.9 %   Eosinophils Relative 2.0 %   Basophils Relative 0.3 %  COMPLETE METABOLIC PANEL WITH GFR  Result Value Ref Range   Glucose, Bld 119 (H) 65 - 99 mg/dL   BUN 8 7 - 25 mg/dL   Creat 0.94 (H)  0.60 - 0.93 mg/dL   GFR, Est Non African American 60 > OR = 60 mL/min/1.6673m2   GFR, Est African American 69 > OR = 60 mL/min/1.10473m2   BUN/Creatinine Ratio 9 6 - 22 (calc)   Sodium 143 135 - 146 mmol/L   Potassium 3.6 3.5 - 5.3 mmol/L   Chloride 103 98 - 110 mmol/L   CO2 30 20 - 32 mmol/L   Calcium 10.3 8.6 - 10.4 mg/dL   Total Protein 7.7 6.1 - 8.1 g/dL   Albumin 4.6 3.6 - 5.1 g/dL    Globulin 3.1 1.9 - 3.7 g/dL (calc)   AG Ratio 1.5 1.0 - 2.5 (calc)   Total Bilirubin 0.6 0.2 - 1.2 mg/dL   Alkaline phosphatase (APISO) 89 37 - 153 U/L   AST 21 10 - 35 U/L   ALT 20 6 - 29 U/L  Lipid panel  Result Value Ref Range   Cholesterol 152 <200 mg/dL   HDL 65 > OR = 50 mg/dL   Triglycerides 098142 <119<150 mg/dL   LDL Cholesterol (Calc) 64 mg/dL (calc)   Total CHOL/HDL Ratio 2.3 <5.0 (calc)   Non-HDL Cholesterol (Calc) 87 <147<130 mg/dL (calc)  Hemoglobin W2NA1c  Result Value Ref Range   Hgb A1c MFr Bld 6.8 (H) <5.7 % of total Hgb   Mean Plasma Glucose 148 (calc)   eAG (mmol/L) 8.2 (calc)  VITAMIN D 25 Hydroxy (Vit-D Deficiency, Fractures)  Result Value Ref Range   Vit D, 25-Hydroxy 20 (L) 30 - 100 ng/mL       Assessment & Plan:   1. Primary osteoarthritis of left knee 2. Chronic pain of both knees Discussed with patient the likely source of her knee pains, osteoarthritis, increased with the activities that she does on a routine basis.  Discussed seeing orthopedics again, and she noted she did not want to see [facility] again, does not want to have surgery as was felt to be a next step if the injections not helping, and [place] in [place] that she would like to see that advertises nonsurgical relief methods, she noted "plexogenic" treatment. I noted I would be happy to refer to a different orthopedist in [place], and she wanted to call on her own the doctor mentioned above that she is aware of presently before seeing orthopedics. Did recommend liberal use of ice, especially at nighttime, not applying heat to her knee joints, and did not add further medications as she notes she is allergic to a lot of medicines and not tolerant of many medications and hesitant to add. Also advocated to lessen activities that are problematic, trying to get help with someone mowing the lawn for her would be helpful especially.  3. Lower abdominal pain Her exam today was very benign, and that was  reassuring. May have a spastic colon as the source, and reassuring is relieved with bowel movements, and seems to only occur after activities.  Cannot exclude an ischemic colitis concern, noting that history, although the pain is not all that severe, there is no bleeding, and seems less likely. Offered a Bentyl product or Levsin product to try to help, using as needed, and she was very hesitant to add a medication and did not want to use presently. Also, modifying activities was again recommended and felt best for her presently. Do not feel rushing to a colonoscopy is needed at this point, and she noted her last was in 2012, with a 10-year follow-up recommended. If her symptoms are not remaining  controlled, and are increasing at all, other symptoms in association developing, may ask her gastroenterologist for input, which may include another scoping procedure as part of that work-up, although await her response over time.  4. Chronic pain of both shoulders Do feel more muscle related, with activity modifications recommended. We will follow-up if more concerns arise with her shoulders as discussed  5. Osteoporosis She noted she had not tolerated a lot of treatments for this, and do feel this is another reason to try to modify activity some to lessen risk of potential falls and fractures in the future.       Jamelle Haring, MD 11/15/19 2:30 PM

## 2019-11-15 ENCOUNTER — Ambulatory Visit (INDEPENDENT_AMBULATORY_CARE_PROVIDER_SITE_OTHER): Payer: Medicare HMO | Admitting: Internal Medicine

## 2019-11-15 ENCOUNTER — Encounter: Payer: Self-pay | Admitting: Internal Medicine

## 2019-11-15 ENCOUNTER — Other Ambulatory Visit: Payer: Self-pay

## 2019-11-15 VITALS — BP 108/78 | HR 80 | Temp 97.9°F | Resp 16 | Ht 62.0 in | Wt 134.8 lb

## 2019-11-15 DIAGNOSIS — M25561 Pain in right knee: Secondary | ICD-10-CM | POA: Diagnosis not present

## 2019-11-15 DIAGNOSIS — M25511 Pain in right shoulder: Secondary | ICD-10-CM

## 2019-11-15 DIAGNOSIS — M25512 Pain in left shoulder: Secondary | ICD-10-CM | POA: Diagnosis not present

## 2019-11-15 DIAGNOSIS — M1712 Unilateral primary osteoarthritis, left knee: Secondary | ICD-10-CM

## 2019-11-15 DIAGNOSIS — M25562 Pain in left knee: Secondary | ICD-10-CM | POA: Diagnosis not present

## 2019-11-15 DIAGNOSIS — R1030 Lower abdominal pain, unspecified: Secondary | ICD-10-CM | POA: Diagnosis not present

## 2019-11-15 DIAGNOSIS — G8929 Other chronic pain: Secondary | ICD-10-CM

## 2019-11-15 NOTE — Patient Instructions (Signed)

## 2019-11-26 ENCOUNTER — Ambulatory Visit: Payer: Self-pay

## 2019-11-26 NOTE — Telephone Encounter (Signed)
Patient called stating that she has chest pain.  She feels that it started with her weekly Vitamin D.  She states that it is in her chest under her breast. She states that the pain go to her shoulders both sides and causes her to feel SOB.  She states she is very weak and tired. Per protocol patient was told to go to ER for evaluation. Care advice was read to patient. Patient states she does not have pain now. Patient was again advised that her symptoms Chest pain, shoulder pain weakness dizziness when standing were symptoms that should be evaluated in the ER.  She verbalized understanding.  Reason for Disposition . Pain also in shoulder(s) or arm(s) or jaw  Answer Assessment - Initial Assessment Questions 1. LOCATION: "Where does it hurt?"       Chest and arm shoulder both shoulder weakness 2. RADIATION: "Does the pain go anywhere else?" (e.g., into neck, jaw, arms, back)     neck 3. ONSET: "When did the chest pain begin?" (Minutes, hours or days)      When taking Vitamin D 4. PATTERN "Does the pain come and go, or has it been constant since it started?"  "Does it get worse with exertion?"      Worse when sitting and tired 5. DURATION: "How long does it last" (e.g., seconds, minutes, hours)     everyday 6. SEVERITY: "How bad is the pain?"  (e.g., Scale 1-10; mild, moderate, or severe)    - MILD (1-3): doesn't interfere with normal activities     - MODERATE (4-7): interferes with normal activities or awakens from sleep    - SEVERE (8-10): excruciating pain, unable to do any normal activities       Moderate 7. CARDIAC RISK FACTORS: "Do you have any history of heart problems or risk factors for heart disease?" (e.g., angina, prior heart attack; diabetes, high blood pressure, high cholesterol, smoker, or strong family history of heart disease)     Diabetic, cholesterol,  8. PULMONARY RISK FACTORS: "Do you have any history of lung disease?"  (e.g., blood clots in lung, asthma, emphysema, birth  control pills)     Blood clot when younger 22. CAUSE: "What do you think is causing the chest pain?"    Unsure 10. OTHER SYMPTOMS: "Do you have any other symptoms?" (e.g., dizziness, nausea, vomiting, sweating, fever, difficulty breathing, cough)      Weak, dizzy when stands first time, forces cough to breath 11. PREGNANCY: "Is there any chance you are pregnant?" "When was your last menstrual period?"       N/A  Protocols used: CHEST PAIN-A-AH

## 2019-11-28 DIAGNOSIS — M25562 Pain in left knee: Secondary | ICD-10-CM | POA: Diagnosis not present

## 2019-11-28 DIAGNOSIS — M25561 Pain in right knee: Secondary | ICD-10-CM | POA: Diagnosis not present

## 2019-11-28 DIAGNOSIS — M170 Bilateral primary osteoarthritis of knee: Secondary | ICD-10-CM | POA: Diagnosis not present

## 2019-12-10 ENCOUNTER — Telehealth: Payer: Self-pay | Admitting: Family Medicine

## 2019-12-10 NOTE — Telephone Encounter (Signed)
Left message for patient to schedule Annual Wellness Visit.  Please schedule with Nurse Health Advisor Reather Littler, RN.   Patient mailbox was full

## 2019-12-11 DIAGNOSIS — M170 Bilateral primary osteoarthritis of knee: Secondary | ICD-10-CM | POA: Diagnosis not present

## 2020-01-15 ENCOUNTER — Telehealth: Payer: Self-pay | Admitting: Family Medicine

## 2020-01-15 NOTE — Telephone Encounter (Signed)
Left message for patient to call back and schedule Medicare Annual Wellness Visit (AWV) either virtually or in office.  Last AWV was 08/01/2018; please schedule at anytime with [facility] Health Advisor.

## 2020-02-07 ENCOUNTER — Telehealth: Payer: Self-pay | Admitting: Family Medicine

## 2020-02-07 NOTE — Chronic Care Management (AMB) (Signed)
  Chronic Care Management   Note  02/07/2020 Name: [name] MRN: [id number] DOB: 1946/02/23  Kiara Brady is a 74 y.o. year old female who is a primary care patient of Delsa Grana, [place]. I reached out to Kiara Brady by phone today in response to a referral sent by Ms. Bronson Ing Shaheen's health plan.     Ms. Shinault was given information about Chronic Care Management services today including:  1. CCM service includes personalized support from designated clinical staff supervised by her physician, including individualized plan of care and coordination with other care providers 2. 24/7 contact phone numbers for assistance for urgent and routine care needs. 3. Service will only be billed when office clinical staff spend 20 minutes or more in a month to coordinate care. 4. Only one practitioner may furnish and bill the service in a calendar month. 5. The patient may stop CCM services at any time (effective at the end of the month) by phone call to the office staff. 6. The patient will be responsible for cost sharing (co-pay) of up to 20% of the service fee (after annual deductible is met).  Patient did not agree to enrollment in care management services and does not wish to consider at this time.  Follow up plan: The care management team is available to follow up with the patient after provider conversation with the patient regarding recommendation for care management engagement and subsequent re-referral to the care management team.   Noreene Larsson, [address], [place], [place] [address] Direct Dial: [phone] [email or web address] Website: [email or web address]

## 2020-02-14 ENCOUNTER — Telehealth: Payer: Self-pay | Admitting: Family Medicine

## 2020-02-14 NOTE — Telephone Encounter (Signed)
Patient is requesting to get permission from PCP to get a mammogram.

## 2020-02-15 ENCOUNTER — Other Ambulatory Visit: Payer: Self-pay | Admitting: Emergency Medicine

## 2020-02-15 DIAGNOSIS — Z1231 Encounter for screening mammogram for malignant neoplasm of breast: Secondary | ICD-10-CM

## 2020-02-15 NOTE — Telephone Encounter (Signed)
Order put in system waiting on signature then patient can call and schedule

## 2020-02-26 NOTE — Progress Notes (Signed)
Patient ID: Kiara Brady, female    DOB: Oct 14, 1945, 74 y.o.   MRN: [id number]  PCP: [name], PA-C  Chief Complaint  Patient presents with  . Follow-up    Subjective:   Kiara Brady is a 74 y.o. female, presents to clinic with CC of the following:  Chief Complaint  Patient presents with  . Follow-up    HPI:  Patient is a 74 year old female patient of [name] Her visit was in January 2021. I last saw the patient 11/15/2019 for orthopedic complaints Presents today for follow-up. In general has been feeling "old", still with knee pain and chronic shoulder pains.  Diabetes Mellitus Type II: Med regimen - metformin 500 mg once a day, she notes she has never been on more than once a day in her past Not check sugars at home No hypoglycemic episodes symptomatically Denies: + Polyuria (but always drinks a lot of water), polydipsia, polyphagia, vision changes, or neuropathy  Recent pertinent labs: Lab Results  Component Value Date   HGBA1C 6.8 (H) 08/31/2019   HGBA1C 6.1 (H) 02/27/2019   HGBA1C 5.9 (H) 06/22/2018   Lab Results  Component Value Date   MICROALBUR 0.4 02/27/2019   LDLCALC 64 08/31/2019   CREATININE 0.94 (H) 08/31/2019    Current diet: in general, a "healthy" diet   Current exercise: none  eye exam - not recently, had appt but not go ACEI/ARB: no Statin: Yes  Hyperlipidemia: Current Medication Regimen:  Atorvastatin 10 mg Last Lipids: Lab Results  Component Value Date   CHOL 152 08/31/2019   HDL 65 08/31/2019   LDLCALC 64 08/31/2019   TRIG 142 08/31/2019   CHOLHDL 2.3 08/31/2019    Denies increased myalgias with the exception of the chronic upper back pains, and unclear if this may be related to the statin.  She has not had a temporal relationship when this was started.  Do feel there are marked benefits of this medicine being diabetic.  If not responding to recommendations, may try to lessen or stop the medicine and assess  response.  Osteoarthritis knee- She has seen orthopedist in the past for her knee pain, had injections, which worked well initially, although over time, they were less helpful.  Had x-rays done and were consistent with bilateral knee osteoarthritis.  She had Euflexxa injections given without much help noted.  She notes she feels the pain on the inside of her knees, where her larger bones are sticking out some, and sometimes problematic when she is walking.  They do not get stuck.    scheduled for surgery in June (TKR of left knee)  She was on Fosamax for osteoporosis, but could not take it due to nausea.  Also could not tolerate vitamin D products.  Her last bone scan was reviewed and was consistent with osteoporosis.     Chronic shoulder pain-as noted on last visit, it is not pain directly at the shoulders, more muscle aches in the upper back towards the shoulders, and the trap muscle region.  Continues to have no pains down the arms, not dropping things.  States can be worse in the morning when she first gets up, although then as she starts to get a little more active.  She thinks she may need an MRI.   Lives by self, thinking of going to [place] in the next few months.   Patient Active Problem List   Diagnosis Date Noted  . Lower abdominal pain 11/15/2019  .  Osteoporosis 02/06/2018  . 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 Degenerative arthritis of left knee 07/04/2017  . Hyperlipidemia 03/23/2016  . Cardiac murmur, previously undiagnosed 01/23/2016  . Heart murmur 09/03/2015  . Chronic pain of both knees 06/30/2015  . Dyslipidemia associated with type 2 diabetes mellitus (HCC) 02/26/2015  . Diabetes mellitus, type 2 (HCC) 01/06/2015      Current Outpatient Medications:  .  metFORMIN (GLUCOPHAGE) 500 MG tablet, Take 1 tablet (500 mg total) by mouth daily with breakfast., Disp: 90 tablet, Rfl: 1 .  atorvastatin (LIPITOR) 10 MG tablet, Take 1 tablet (10 mg  total) by mouth at bedtime., Disp: 90 tablet, Rfl: 3   Allergies  Allergen Reactions  . Oxycodone Other (See Comments)  . Penicillin G Hives and Shortness Of Breath  . Penicillins   . Prednisone Cough  . Vitamin D Analogs     Chest pain  . Xanax [Alprazolam]     hyperactivity  . Fosamax [Alendronate Sodium] Nausea Only     Past Surgical History:  Procedure Laterality Date  . VARICOSE VEIN SURGERY       Family History  Problem Relation Age of Onset  . Heart disease Mother   . Alzheimer's disease Mother   . Heart attack Mother   . Alcohol abuse Father   . Cancer Maternal Grandmother        breast  . Breast cancer Maternal Grandmother   . Diabetes Sister      Social History   Tobacco Use  . Smoking status: Former Games developer  . Smokeless tobacco: Never Used  Substance Use Topics  . Alcohol use: No    With staff assistance, above reviewed with the patient today.  ROS: As per HPI, otherwise no specific complaints on a limited and focused system review   No results found for this or any previous visit (from the past 72 hour(s)).   PHQ2/9: Depression screen [facility] 2/9 02/27/2020 11/15/2019 08/31/2019 08/27/2019 05/17/2019  Decreased Interest 0 0 0 0 0  Down, Depressed, Hopeless 1 0 0 0 0  PHQ - 2 Score 1 0 0 0 0  Altered sleeping 0 0 0 0 0  Tired, decreased energy 1 2 0 0 0  Change in appetite 0 0 0 0 0  Feeling bad or failure about yourself  0 0 0 0 0  Trouble concentrating 0 0 3 0 0  Moving slowly or fidgety/restless 0 0 0 0 0  Suicidal thoughts 0 0 0 0 0  PHQ-9 Score 2 2 3  0 0  Difficult doing work/chores Not difficult at all Not difficult at all Somewhat difficult Not difficult at all Not difficult at all  Some recent data might be hidden   PHQ-2/9 Result reviewed  Fall Risk: Fall Risk  02/27/2020 11/15/2019 08/31/2019 08/27/2019 05/17/2019  Falls in the past year? 1 1 1  0 0  Number falls in past yr: 1 0 1 0 0  Injury with Fall? 1 1 1  0 0  Follow up - - - -  Falls evaluation completed      Objective:   Vitals:   02/27/20 1504  BP: 114/72  Pulse: 72  Resp: 16  Temp: 97.8 F (36.6 C)  TempSrc: Axillary  SpO2: 97%  Weight: 133 lb 14.4 oz (60.7 kg)  Height: 5\' 2"  (1.575 m)    Body mass index is 24.49 kg/m.  Physical Exam   NAD, masked HEENT - /AT, sclera anicteric, PERRL, EOMI, conj -  non-inj'ed,  pharynx clear Neck - supple, no adenopathy, carotids 2+ and = without bruits bilat Car - RRR without m/g/r Pulm- RR and effort normal at rest, CTA without wheeze or rales Abd - soft, NT diffusely, ND,  Back - no CVA tenderness, mildly tender in the upper trap muscle complexes bilaterally extending up towards the neck area, nontender over the cervical or thoracic spine. Ext - no LE edema, no radicular signs in the upper extremity, mild chronic knee deformities noted bilaterally, adequate range of motion. Neuro/psychiatric - affect was not flat, appropriate with conversation  Alert, able to arise from the chair and walk to the exam table and climb up the step onto the exam table without assistance.  Grossly non-focal   Speech normal  Results for orders placed or performed in visit on 08/31/19  CBC with Differential/Platelet  Result Value Ref Range   WBC 9.5 3.8 - 10.8 Thousand/uL   RBC 4.27 3.80 - 5.10 Million/uL   Hemoglobin 13.4 11.7 - 15.5 g/dL   HCT 53.639.4 35 - 45 %   MCV 92.3 80.0 - 100.0 fL   MCH 31.4 27.0 - 33.0 pg   MCHC 34.0 32.0 - 36.0 g/dL   RDW 64.411.8 03.411.0 - 74.215.0 %   Platelets 365 140 - 400 Thousand/uL   MPV 9.9 7.5 - 12.5 fL   Neutro Abs 5,662 1,500 - 7,800 cells/uL   Lymphs Abs 3,059 850 - 3,900 cells/uL   Absolute Monocytes 561 200 - 950 cells/uL   Eosinophils Absolute 190 15 - 500 cells/uL   Basophils Absolute 29 0 - 200 cells/uL   Neutrophils Relative % 59.6 %   Total Lymphocyte 32.2 %   Monocytes Relative 5.9 %   Eosinophils Relative 2.0 %   Basophils Relative 0.3 %  COMPLETE METABOLIC PANEL WITH GFR  Result  Value Ref Range   Glucose, Bld 119 (H) 65 - 99 mg/dL   BUN 8 7 - 25 mg/dL   Creat 5.950.94 (H) 6.380.60 - 0.93 mg/dL   GFR, Est Non African American 60 > OR = 60 mL/min/1.2373m2   GFR, Est African American 69 > OR = 60 mL/min/1.4073m2   BUN/Creatinine Ratio 9 6 - 22 (calc)   Sodium 143 135 - 146 mmol/L   Potassium 3.6 3.5 - 5.3 mmol/L   Chloride 103 98 - 110 mmol/L   CO2 30 20 - 32 mmol/L   Calcium 10.3 8.6 - 10.4 mg/dL   Total Protein 7.7 6.1 - 8.1 g/dL   Albumin 4.6 3.6 - 5.1 g/dL   Globulin 3.1 1.9 - 3.7 g/dL (calc)   AG Ratio 1.5 1.0 - 2.5 (calc)   Total Bilirubin 0.6 0.2 - 1.2 mg/dL   Alkaline phosphatase (APISO) 89 37 - 153 U/L   AST 21 10 - 35 U/L   ALT 20 6 - 29 U/L  Lipid panel  Result Value Ref Range   Cholesterol 152 <200 mg/dL   HDL 65 > OR = 50 mg/dL   Triglycerides 756142 <433<150 mg/dL   LDL Cholesterol (Calc) 64 mg/dL (calc)   Total CHOL/HDL Ratio 2.3 <5.0 (calc)   Non-HDL Cholesterol (Calc) 87 <295<130 mg/dL (calc)  Hemoglobin [id number]  Result Value Ref Range   Hgb A1c MFr Bld 6.8 (H) <5.7 % of total Hgb   Mean Plasma Glucose 148 (calc)   eAG (mmol/L) 8.2 (calc)  VITAMIN D 25 Hydroxy (Vit-D Deficiency, Fractures)  Result Value Ref Range   Vit D, 25-Hydroxy 20 (L) 30 - 100  ng/mL   Last labs reviewed    Assessment & Plan:   1. Type 2 diabetes mellitus without complication, without long-term current use of insulin (HCC) Her last A1c was slightly higher at 6.8, and discussed the need to potentially increase her Metformin dose. We will recheck an A1c today and await that result. Also check a urine for microalbumin.  Also a BMP. Continue with diet modifications to help as well as she has been trying. - Hemoglobin A1c - Microalbumin / creatinine urine ratio - BASIC METABOLIC PANEL WITH GFR  2. Primary osteoarthritis of left knee Has been followed by orthopedics, and has a surgery scheduled for June.  She notes she is not sure she is going to follow through with this, and thinks she  wants to relocate to [place] and have further follow-up after she is there Still recommended cold therapy to help She last visit talked about seeing a doctor in [place] advertised nonsurgical relief methods and she has not followed up with that to date.  3. Chronic pain of both shoulders Educated, Do feel more muscle related, with activity modifications recommended. Also recommended warm compresses to the area and do some gentle stretching and flexibility exercises to help as well. Do not feel an MRI would be helpful nor indicated at this time. To follow-up if more concerns arise with her shoulders/upper back as discussed  4. Mixed hyperlipidemia Last lipid panel was good. Question if the statin may be a source to some of her myalgias, although as noted above, I do think it is very beneficial given her diabetes. May try to reduce or stop the statin over time pending her status.  Certainly will be an option if her symptoms are not improving or increasing. - BASIC METABOLIC PANEL WITH GFR  5. Age-related osteoporosis without current pathological fracture She noted she had not tolerated a lot of treatments for this, and do feel this is another reason to try to modify activity some to lessen risk of potential falls and fractures in the future.   Await lab tests ordered, and she noted she may be relocating to [place] in the next 2 to 3 months, but still felt best to tentatively schedule follow-up in 6 months, follow-up sooner as needed.       Jamelle Haring, MD 02/27/20 3:21 PM

## 2020-02-27 ENCOUNTER — Encounter: Payer: Self-pay | Admitting: Internal Medicine

## 2020-02-27 ENCOUNTER — Ambulatory Visit (INDEPENDENT_AMBULATORY_CARE_PROVIDER_SITE_OTHER): Payer: Medicare HMO | Admitting: Internal Medicine

## 2020-02-27 ENCOUNTER — Other Ambulatory Visit: Payer: Self-pay

## 2020-02-27 VITALS — BP 114/72 | HR 72 | Temp 97.8°F | Resp 16 | Ht 62.0 in | Wt 133.9 lb

## 2020-02-27 DIAGNOSIS — M810 Age-related osteoporosis without current pathological fracture: Secondary | ICD-10-CM

## 2020-02-27 DIAGNOSIS — G8929 Other chronic pain: Secondary | ICD-10-CM

## 2020-02-27 DIAGNOSIS — E119 Type 2 diabetes mellitus without complications: Secondary | ICD-10-CM | POA: Diagnosis not present

## 2020-02-27 DIAGNOSIS — M25511 Pain in right shoulder: Secondary | ICD-10-CM

## 2020-02-27 DIAGNOSIS — M1712 Unilateral primary osteoarthritis, left knee: Secondary | ICD-10-CM

## 2020-02-27 DIAGNOSIS — M25512 Pain in left shoulder: Secondary | ICD-10-CM | POA: Diagnosis not present

## 2020-02-27 DIAGNOSIS — E782 Mixed hyperlipidemia: Secondary | ICD-10-CM

## 2020-02-28 LAB — BASIC METABOLIC PANEL WITH GFR
BUN: 7 mg/dL (ref 7–25)
CO2: 26 mmol/L (ref 20–32)
Calcium: 9.3 mg/dL (ref 8.6–10.4)
Chloride: 106 mmol/L (ref 98–110)
Creat: 0.93 mg/dL (ref 0.60–0.93)
GFR, Est African American: 70 mL/min/{1.73_m2} (ref >=60)
GFR, Est Non African American: 61 mL/min/{1.73_m2} (ref >=60)
Glucose, Bld: 107 mg/dL — ABNORMAL HIGH (ref 65–99)
Potassium: 3.8 mmol/L (ref 3.5–5.3)
Sodium: 142 mmol/L (ref 135–146)

## 2020-02-28 LAB — HEMOGLOBIN A1C
Hgb A1c MFr Bld: 6.4 % of total Hgb — ABNORMAL HIGH (ref <5.7)
Mean Plasma Glucose: 137 (calc)
eAG (mmol/L): 7.6 (calc)

## 2020-02-28 LAB — MICROALBUMIN / CREATININE URINE RATIO
Creatinine, Urine: 61 mg/dL (ref 20–275)
Microalb Creat Ratio: 10 mcg/mg creat (ref <30)
Microalb, Ur: 0.6 mg/dL

## 2020-03-10 ENCOUNTER — Telehealth: Payer: Self-pay | Admitting: Family Medicine

## 2020-03-10 NOTE — Telephone Encounter (Deleted)
Copied from CRM #[id number]. Topic: Medicare AWV >> Mar 10, 2020  9:58 AM Smith, Stephanie R wrote: Reason for CRM: Left message for patient to call back and schedule the Medicare Annual Wellness Visit (AWV) in office or virtual  Last AWV 08/01/2018  Please schedule at anytime with [facility] Advisor.  40 minute appointment  Any questions, please contact me at [phone] 

## 2020-03-10 NOTE — Telephone Encounter (Signed)
Copied from CRM [id number]. Topic: Medicare AWV >> Mar 10, 2020  9:58 AM Claudette Laws R wrote: Reason for CRM: Left message for patient to call back and schedule the Medicare Annual Wellness Visit (AWV) in office or virtual  Last AWV 08/01/2018  Please schedule at anytime with [facility] Health Advisor.  40 minute appointment  Any questions, please contact me at [phone]

## 2020-03-10 NOTE — Telephone Encounter (Signed)
Opened encounter in error  

## 2020-03-13 ENCOUNTER — Ambulatory Visit (INDEPENDENT_AMBULATORY_CARE_PROVIDER_SITE_OTHER): Payer: Medicare HMO

## 2020-03-13 ENCOUNTER — Telehealth: Payer: Self-pay | Admitting: Family Medicine

## 2020-03-13 DIAGNOSIS — Z0000 Encounter for general adult medical examination without abnormal findings: Secondary | ICD-10-CM | POA: Diagnosis not present

## 2020-03-13 NOTE — Progress Notes (Signed)
Subjective:   Kiara Brady is a 74 y.o. female who presents for Medicare Annual (Subsequent) preventive examination.  Virtual Visit via Telephone Note  I connected with  Kiara Brady on 03/13/20 at 10:00 AM EDT by telephone and verified that I am speaking with the correct person using two identifiers.  Medicare Annual Wellness visit completed telephonically due to Covid-19 pandemic.   Location: Patient: home Provider: [facility]   I discussed the limitations, risks, security and privacy concerns of performing an evaluation and management service by telephone and the availability of in person appointments. The patient expressed understanding and agreed to proceed.  Unable to perform video visit due to video visit attempted and failed and/or patient does not have video capability.   Some vital signs may be absent or patient reported.   Reather Littler, LPN    Review of Systems     Cardiac Risk Factors include: advanced age (>13men, >9 women);diabetes mellitus;dyslipidemia     Objective:    There were no vitals filed for this visit. There is no height or weight on file to calculate BMI.  Advanced Directives 03/13/2020 08/24/2018 08/01/2018 05/25/2017 05/17/2017 04/11/2017 03/17/2017  Does Patient Have a Medical Advance Directive? Yes No No No No No No  Type of Estate agent of [place];Living will - - - - - -  Copy of Healthcare Power of Attorney in Chart? No - copy requested - - - - - -  Would patient like information on creating a medical advance directive? - - Yes (MAU/Ambulatory/Procedural Areas - Information given) - - - -    Current Medications (verified) Outpatient Encounter Medications as of 03/13/2020  Medication Sig  . atorvastatin (LIPITOR) 10 MG tablet Take 1 tablet (10 mg total) by mouth at bedtime.  . metFORMIN (GLUCOPHAGE) 500 MG tablet Take 1 tablet (500 mg total) by mouth daily with breakfast.   No facility-administered encounter  medications on file as of 03/13/2020.    Allergies (verified) Oxycodone, Penicillin g, Penicillins, Prednisone, Vitamin d analogs, Xanax [alprazolam], and Fosamax [alendronate sodium]   History: Past Medical History:  Diagnosis Date  . Diabetes mellitus without complication (HCC)   . Hyperlipidemia   . Osteopenia of neck of femur 12/19/2017  . Osteoporosis 02/06/2018   July 2019  . Polio    [place]   Past Surgical History:  Procedure Laterality Date  . VARICOSE VEIN SURGERY     Family History  Problem Relation Age of Onset  . Heart disease Mother   . Alzheimer's disease Mother   . Heart attack Mother   . Alcohol abuse Father   . Cancer Maternal Grandmother        breast  . Breast cancer Maternal Grandmother   . Diabetes Sister    Social History   Socioeconomic History  . Marital status: Divorced    Spouse name: Not on file  . Number of children: 2  . Years of education: Not on file  . Highest education level: Master's degree (e.g., MA, MS, MEng, MEd, MSW, MBA)  Occupational History  . Not on file  Tobacco Use  . Smoking status: Former Games developer  . Smokeless tobacco: Never Used  Vaping Use  . Vaping Use: Never used  Substance and Sexual Activity  . Alcohol use: No  . Drug use: No  . Sexual activity: Not Currently  Other Topics Concern  . Not on file  Social History Narrative   Pt  lives alone, daughter is in another country and [place] in [place]   Social Determinants of Health   Financial Resource Strain: Low Risk   . Difficulty of Paying Living Expenses: Not hard at all  Food Insecurity: No Food Insecurity  . Worried About Programme researcher, broadcasting/film/videounning Out of Food in the Last Year: Never true  . Ran Out of Food in the Last Year: Never true  Transportation Needs: No Transportation Needs  . Lack of Transportation (Medical): No  . Lack of Transportation (Non-Medical): No  Physical Activity: Inactive  .  Days of Exercise per Week: 0 days  . Minutes of Exercise per Session: 0 min  Stress: No Stress Concern Present  . Feeling of Stress : Only a little  Social Connections: Socially Isolated  . Frequency of Communication with Friends and Family: Twice a week  . Frequency of Social Gatherings with Friends and Family: Once a week  . Attends Religious Services: Never  . Active Member of Clubs or Organizations: No  . Attends BankerClub or Organization Meetings: Never  . Marital Status: Divorced    Tobacco Counseling Counseling given: Not Answered   Clinical Intake:  Pre-visit preparation completed: Yes  Pain : No/denies pain     Nutritional Risks: None Diabetes: Yes CBG done?: No Did pt. bring in CBG monitor from home?: No  How often do you need to have someone help you when you read instructions, pamphlets, or other written materials from your doctor or pharmacy?: 1 - Never  Nutrition Risk Assessment:  Has the patient had any N/V/D within the last 2 months?  No  Does the patient have any non-healing wounds?  No  Has the patient had any unintentional weight loss or weight gain?  No   Diabetes:  Is the patient diabetic?  Yes  If diabetic, was a CBG obtained today?  No  Did the patient bring in their glucometer from home?  No  How often do you monitor your CBG's? Pt does not routinely check blood sugar, does not like to prick finger.   Financial Strains and Diabetes Management:  Are you having any financial strains with the device, your supplies or your medication? No .  Does the patient want to be seen by Chronic Care Management for management of their diabetes?  No  Would the patient like to be referred to a Nutritionist or for Diabetic Management?  No   Diabetic Exams:  Diabetic Eye Exam: Completed 01/14/18, referral to [facility] placed on 08/31/19 where patient plans to establish care. Overdue for diabetic eye exam. Pt has been advised about the importance in  completing this exam.   Diabetic Foot Exam: Completed 08/31/19.   Interpreter Needed?: No  Information entered by :: Reather LittlerKasey Sadye Kiernan LPN   Activities of Daily Living In your present state of health, do you have any difficulty performing the following activities: 03/13/2020 02/27/2020  Hearing? N N  Comment declines hearing aids -  Vision? N N  Difficulty concentrating or making decisions? Y Y  Comment - -  Walking or climbing stairs? Y Y  Comment - stairs are a challenge at time  Dressing or bathing? N N  Doing errands, shopping? N N  Preparing Food and eating ? N -  Using the Toilet? N -  In the past six months, have you accidently leaked urine? N -  Do you have problems with loss of bowel control? N -  Managing your  Medications? N -  Managing your Finances? N -  Housekeeping or managing your Housekeeping? N -  Some recent data might be hidden    Patient Care Team: [name], PA-C as PCP - General (Family Medicine)  Indicate any recent Medical Services you may have received from other than [facility] providers in the past year (date may be approximate).     Assessment:   This is a routine wellness examination for Kiara Brady.  Hearing/Vision screen  Hearing Screening   125Hz  250Hz  500Hz  1000Hz  2000Hz  3000Hz  4000Hz  6000Hz  8000Hz   Right ear:           Left ear:           Comments:  Pt has no difficulty hearing  Vision Screening Comments: Past due for eye exam. Plans to establish care with [facility]   Dietary issues and exercise activities discussed: Current Exercise Habits: The patient does not participate in regular exercise at present, Exercise limited by: orthopedic condition(s)  Goals    . Exercise 3x per week (30 min per time)     Recommend walking 3 days a week for 20-30 minutes.      Depression Screen PHQ 2/9 Scores 03/13/2020 02/27/2020 11/15/2019 08/31/2019 08/27/2019 05/17/2019 02/27/2019  PHQ - 2 Score 1 1 0 0 0 0 1  PHQ- 9 Score 2 2 2 3  0 0 1    Fall  Risk Fall Risk  03/13/2020 02/27/2020 11/15/2019 08/31/2019 08/27/2019  Falls in the past year? 1 1 1 1  0  Number falls in past yr: 1 1 0 1 0  Injury with Fall? 1 1 1 1  0  Risk for fall due to : History of fall(s) - - - -  Follow up Falls prevention discussed - - - -    Any stairs in or around the home? No  If so, are there any without handrails? No  Home free of loose throw rugs in walkways, pet beds, electrical cords, etc? Yes  Adequate lighting in your home to reduce risk of falls? Yes   ASSISTIVE DEVICES UTILIZED TO PREVENT FALLS:  Life alert? No  Use of a cane, walker or w/c? Yes  Grab bars in the bathroom? No  Shower chair or bench in shower? Yes  Elevated toilet seat or a handicapped toilet? Yes   TIMED UP AND GO:  Was the test performed? No . Telephonic visit.  Cognitive Function: 6CIT deferred for 2021 AWV; pt would benefit from in office MMSE or other cognitive exam due to pt states she is forgetful at times and often struggles with driving directions.      6CIT Screen 08/01/2018 01/10/2017  What Year? 0 points 0 points  What month? 0 points 0 points  What time? 0 points 0 points  Count back from 20 0 points 0 points  Months in reverse 0 points 0 points  Repeat phrase 0 points 0 points  Total Score 0 0    Immunizations Immunization History  Administered Date(s) Administered  . Influenza, High Dose Seasonal PF 06/09/2018, 04/17/2019  . Influenza-Unspecified 05/06/2017, 06/09/2018  . Moderna SARS-COVID-2 Vaccination 09/14/2019, 10/12/2019  . Pneumococcal Conjugate-13 06/05/2016, 07/01/2017  . Pneumococcal Polysaccharide-23 05/10/2019  . Tdap 08/24/2018    TDAP status: Up to date   Flu Vaccine status: Up to date   Pneumococcal vaccine status: Up to date   Covid-19 vaccine status: Completed vaccines  Qualifies for Shingles Vaccine? Yes   Zostavax completed No   Shingrix Completed?: No.  Education has been provided regarding the importance of this vaccine.  Patient has been advised to call insurance company to determine out of pocket expense if they have not yet received this vaccine. Advised may also receive vaccine at local pharmacy or Health Dept. Verbalized acceptance and understanding.  Screening Tests Health Maintenance  Topic Date Due  . OPHTHALMOLOGY EXAM  01/15/2019  . MAMMOGRAM  01/22/2020  . INFLUENZA VACCINE  03/02/2020  . HEMOGLOBIN A1C  08/29/2020  . FOOT EXAM  08/30/2020  . URINE MICROALBUMIN  02/26/2021  . COLONOSCOPY  06/03/2021  . TETANUS/TDAP  08/24/2028  . DEXA SCAN  Completed  . COVID-19 Vaccine  Completed  . Hepatitis C Screening  Completed  . PNA vac Low Risk Adult  Completed    Health Maintenance  Health Maintenance Due  Topic Date Due  . OPHTHALMOLOGY EXAM  01/15/2019  . MAMMOGRAM  01/22/2020  . INFLUENZA VACCINE  03/02/2020    Colorectal cancer screening: Completed 06/04/11. Repeat every 10 years   Mammogram status: Completed 01/22/19. Repeat every year. Scheduled for 03/17/20.   Bone Density status: Completed 01/31/18. Results reflect: Bone density results: OSTEOPOROSIS. Repeat every 2 years.  Lung Cancer Screening: (Low Dose CT Chest recommended if Age 21-80 years, 30 pack-year currently smoking OR have quit w/in 15years.) does not qualify.   Additional Screening:  Hepatitis C Screening: does qualify; Completed 02/28/19  Vision Screening: Recommended annual ophthalmology exams for early detection of glaucoma and other disorders of the eye. Is the patient up to date with their annual eye exam?  No  Who is the provider or what is the name of the office in which the patient attends annual eye exams? Plans to establish care with [facility]  Dental Screening: Recommended annual dental exams for proper oral hygiene  Community Resource Referral / Chronic Care Management: CRR required this visit?  No   CCM required this visit?  No      Plan:     I have personally reviewed and noted the  following in the patient's chart:   . Medical and social history . Use of alcohol, tobacco or illicit drugs  . Current medications and supplements . Functional ability and status . Nutritional status . Physical activity . Advanced directives . List of other physicians . Hospitalizations, surgeries, and ER visits in previous 12 months . Vitals . Screenings to include cognitive, depression, and falls . Referrals and appointments  In addition, I have reviewed and discussed with patient certain preventive protocols, quality metrics, and best practice recommendations. A written personalized care plan for preventive services as well as general preventive health recommendations were provided to patient.     Reather Littler, LPN   [id number]   Nurse Notes: pt states she needs knee surgery but does not have family close by to help her post surgery and uncomfortable with home health or anyone coming in her home. Her sister has invited her to come live in [place] but she is hesitant to do that as well. Patient also does not like taking medications and inquired about using a sauna for steam therapy; advised to discuss with provider for safety.

## 2020-03-13 NOTE — Patient Instructions (Signed)
[name] , Thank you for taking time to come for your Medicare Wellness Visit. I appreciate your ongoing commitment to your health goals. Please review the following plan we discussed and let me know if I can assist you in the future.   Screening recommendations/referrals: Colonoscopy: done 06/04/11. Repeat in 2022. Mammogram: done 01/22/19. Scheduled for 03/17/20.  Bone Density: done 01/31/18 Recommended yearly ophthalmology/optometry visit for glaucoma screening and checkup Recommended yearly dental visit for hygiene and checkup  Vaccinations: Influenza vaccine: done 04/17/19 Pneumococcal vaccine: done 05/10/19 Tdap vaccine: done 08/24/18 Shingles vaccine: Shingrix discussed. Please contact your pharmacy for coverage information.  Covid-19:done 09/14/19 & 10/12/19  Advanced directives: Please bring a copy of your health care power of attorney and living will to the office at your convenience.  Conditions/risks identified: Recommend increasing physical activity   Next appointment: Follow up in one year for your annual wellness visit    Preventive Care 65 Years and Older, Female Preventive care refers to lifestyle choices and visits with your health care provider that can promote health and wellness. What does preventive care include?  A yearly physical exam. This is also called an annual well check.  Dental exams once or twice a year.  Routine eye exams. Ask your health care provider how often you should have your eyes checked.  Personal lifestyle choices, including:  Daily care of your teeth and gums.  Regular physical activity.  Eating a healthy diet.  Avoiding tobacco and drug use.  Limiting alcohol use.  Practicing safe sex.  Taking low-dose aspirin every day.  Taking vitamin and mineral supplements as recommended by your health care provider. What happens during an annual well check? The services and screenings done by your health care provider during your annual well  check will depend on your age, overall health, lifestyle risk factors, and family history of disease. Counseling  Your health care provider may ask you questions about your:  Alcohol use.  Tobacco use.  Drug use.  Emotional well-being.  Home and relationship well-being.  Sexual activity.  Eating habits.  History of falls.  Memory and ability to understand (cognition).  Work and work Astronomer.  Reproductive health. Screening  You may have the following tests or measurements:  Height, weight, and BMI.  Blood pressure.  Lipid and cholesterol levels. These may be checked every 5 years, or more frequently if you are over 33 years old.  Skin check.  Lung cancer screening. You may have this screening every year starting at age 77 if you have a 30-pack-year history of smoking and currently smoke or have quit within the past 15 years.  Fecal occult blood test (FOBT) of the stool. You may have this test every year starting at age 71.  Flexible sigmoidoscopy or colonoscopy. You may have a sigmoidoscopy every 5 years or a colonoscopy every 10 years starting at age 89.  Hepatitis C blood test.  Hepatitis B blood test.  Sexually transmitted disease (STD) testing.  Diabetes screening. This is done by checking your blood sugar (glucose) after you have not eaten for a while (fasting). You may have this done every 1-3 years.  Bone density scan. This is done to screen for osteoporosis. You may have this done starting at age 4.  Mammogram. This may be done every 1-2 years. Talk to your health care provider about how often you should have regular mammograms. Talk with your health care provider about your test results, treatment options, and if necessary, the need for  more tests. Vaccines  Your health care provider may recommend certain vaccines, such as:  Influenza vaccine. This is recommended every year.  Tetanus, diphtheria, and acellular pertussis (Tdap, Td) vaccine. You  may need a Td booster every 10 years.  Zoster vaccine. You may need this after age 33.  Pneumococcal 13-valent conjugate (PCV13) vaccine. One dose is recommended after age 45.  Pneumococcal polysaccharide (PPSV23) vaccine. One dose is recommended after age 20. Talk to your health care provider about which screenings and vaccines you need and how often you need them. This information is not intended to replace advice given to you by your health care provider. Make sure you discuss any questions you have with your health care provider. Document Released: 08/15/2015 Document Revised: 04/07/2016 Document Reviewed: 05/20/2015 Elsevier Interactive Patient Education  2017 [place] Prevention in the Home Falls can cause injuries. They can happen to people of all ages. There are many things you can do to make your home safe and to help prevent falls. What can I do on the outside of my home?  Regularly fix the edges of walkways and driveways and fix any cracks.  Remove anything that might make you trip as you walk through a door, such as a raised step or threshold.  Trim any bushes or trees on the path to your home.  Use bright outdoor lighting.  Clear any walking paths of anything that might make someone trip, such as rocks or tools.  Regularly check to see if handrails are loose or broken. Make sure that both sides of any steps have handrails.  Any raised decks and porches should have guardrails on the edges.  Have any leaves, snow, or ice cleared regularly.  Use sand or salt on walking paths during winter.  Clean up any spills in your garage right away. This includes oil or grease spills. What can I do in the bathroom?  Use night lights.  Install grab bars by the toilet and in the tub and shower. Do not use towel bars as grab bars.  Use non-skid mats or decals in the tub or shower.  If you need to sit down in the shower, use a plastic, non-slip stool.  Keep the floor  dry. Clean up any water that spills on the floor as soon as it happens.  Remove soap buildup in the tub or shower regularly.  Attach bath mats securely with double-sided non-slip rug tape.  Do not have throw rugs and other things on the floor that can make you trip. What can I do in the bedroom?  Use night lights.  Make sure that you have a light by your bed that is easy to reach.  Do not use any sheets or blankets that are too big for your bed. They should not hang down onto the floor.  Have a firm chair that has side arms. You can use this for support while you get dressed.  Do not have throw rugs and other things on the floor that can make you trip. What can I do in the kitchen?  Clean up any spills right away.  Avoid walking on wet floors.  Keep items that you use a lot in easy-to-reach places.  If you need to reach something above you, use a strong step stool that has a grab bar.  Keep electrical cords out of the way.  Do not use floor polish or wax that makes floors slippery. If you must use wax, use non-skid  floor wax.  Do not have throw rugs and other things on the floor that can make you trip. What can I do with my stairs?  Do not leave any items on the stairs.  Make sure that there are handrails on both sides of the stairs and use them. Fix handrails that are broken or loose. Make sure that handrails are as long as the stairways.  Check any carpeting to make sure that it is firmly attached to the stairs. Fix any carpet that is loose or worn.  Avoid having throw rugs at the top or bottom of the stairs. If you do have throw rugs, attach them to the floor with carpet tape.  Make sure that you have a light switch at the top of the stairs and the bottom of the stairs. If you do not have them, ask someone to add them for you. What else can I do to help prevent falls?  Wear shoes that:  Do not have high heels.  Have rubber bottoms.  Are comfortable and fit you  well.  Are closed at the toe. Do not wear sandals.  If you use a stepladder:  Make sure that it is fully opened. Do not climb a closed stepladder.  Make sure that both sides of the stepladder are locked into place.  Ask someone to hold it for you, if possible.  Clearly mark and make sure that you can see:  Any grab bars or handrails.  First and last steps.  Where the edge of each step is.  Use tools that help you move around (mobility aids) if they are needed. These include:  Canes.  Walkers.  Scooters.  Crutches.  Turn on the lights when you go into a dark area. Replace any light bulbs as soon as they burn out.  Set up your furniture so you have a clear path. Avoid moving your furniture around.  If any of your floors are uneven, fix them.  If there are any pets around you, be aware of where they are.  Review your medicines with your doctor. Some medicines can make you feel dizzy. This can increase your chance of falling. Ask your doctor what other things that you can do to help prevent falls. This information is not intended to replace advice given to you by your health care provider. Make sure you discuss any questions you have with your health care provider. Document Released: 05/15/2009 Document Revised: 12/25/2015 Document Reviewed: 08/23/2014 Elsevier Interactive Patient Education  2017 [employer].

## 2020-03-13 NOTE — Telephone Encounter (Signed)
Copied from CRM [id number]. Topic: Medicare AWV >> Mar 13, 2020  8:10 AM Claudette Laws R wrote: Reason for CRM:   8/12 Lmtcb to r/s AWVS due to [facility] out of office -srs

## 2020-03-17 ENCOUNTER — Other Ambulatory Visit: Payer: Self-pay

## 2020-03-17 ENCOUNTER — Ambulatory Visit
Admission: RE | Admit: 2020-03-17 | Discharge: 2020-03-17 | Disposition: A | Discharge status: Home / Self Care | Payer: Medicare HMO | Source: Ambulatory Visit | Attending: Family Medicine | Admitting: Family Medicine

## 2020-03-17 DIAGNOSIS — Z1231 Encounter for screening mammogram for malignant neoplasm of breast: Secondary | ICD-10-CM

## 2020-04-03 DIAGNOSIS — R69 Illness, unspecified: Secondary | ICD-10-CM | POA: Diagnosis not present

## 2020-04-11 ENCOUNTER — Other Ambulatory Visit: Payer: Self-pay

## 2020-04-11 ENCOUNTER — Encounter: Payer: Self-pay | Admitting: Family Medicine

## 2020-04-11 ENCOUNTER — Ambulatory Visit (INDEPENDENT_AMBULATORY_CARE_PROVIDER_SITE_OTHER): Payer: Medicare HMO | Admitting: Family Medicine

## 2020-04-11 VITALS — BP 122/74 | HR 75 | Temp 98.3°F | Resp 14 | Ht 62.0 in | Wt 134.5 lb

## 2020-04-11 DIAGNOSIS — N3001 Acute cystitis with hematuria: Secondary | ICD-10-CM

## 2020-04-11 LAB — POCT URINALYSIS DIPSTICK
Bilirubin, UA: NEGATIVE
Glucose, UA: NEGATIVE
Ketones, UA: NEGATIVE
Nitrite, UA: NEGATIVE
Protein, UA: NEGATIVE
Spec Grav, UA: 1.02 (ref 1.010–1.025)
Urobilinogen, UA: 0.2 E.U./dL
pH, UA: 5 (ref 5.0–8.0)

## 2020-04-11 MED ORDER — LEVOFLOXACIN 500 MG PO TABS: 500.0000 mg | ORAL_TABLET | Freq: Every day | ORAL | 0 refills | Status: DC

## 2020-04-11 NOTE — Progress Notes (Signed)
Patient ID: Kiara Brady, female    DOB: 1946/06/02, 74 y.o.   MRN: [id number]  PCP: [name], PA-C  Chief Complaint  Patient presents with  . Urinary Tract Infection    Subjective:   Kiara Brady is a 74 y.o. female, presents to clinic with CC of the following:  Pt states she got lazy about her GU hygiene and then developed some suprapubic discomfort associated with urination, no hematuria or bothersome dysuria, but pain to lower abd/pelvis that sometimes wraps around hips and low back bilaterally.  She is concerned for UTI.  Urinary Tract Infection  This is a new problem. The current episode started yesterday. The problem has been unchanged. The quality of the pain is described as aching and burning. The pain is mild. There has been no fever. She is not sexually active. There is no history of pyelonephritis. Associated symptoms include urgency. Pertinent negatives include no chills, discharge, flank pain, frequency, hematuria, hesitancy, nausea, sweats or vomiting. She has tried increased fluids for the symptoms. The treatment provided mild relief. There is no history of kidney stones or recurrent UTIs.   She denies change in BM's, no flank pain, balance/gait disturbances, changes to appetite mood energy or cognition per pt report      Patient Active Problem List   Diagnosis Date Noted  . Lower abdominal pain 11/15/2019  . Osteoporosis 02/06/2018  . 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 Osteoarthritis of knees, 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  . Hyperlipidemia 03/23/2016  . Cardiac murmur, previously undiagnosed 01/23/2016  . Heart murmur 09/03/2015  . Chronic pain of both knees 06/30/2015  . Dyslipidemia associated with type 2 diabetes mellitus (HCC) 02/26/2015  . Diabetes mellitus, type 2 (HCC) 01/06/2015      Current Outpatient Medications:  .  atorvastatin (LIPITOR) 10 MG tablet, Take 1 tablet (10 mg total) by mouth at bedtime., Disp: 90  tablet, Rfl: 3 .  metFORMIN (GLUCOPHAGE) 500 MG tablet, Take 1 tablet (500 mg total) by mouth daily with breakfast., Disp: 90 tablet, Rfl: 1   Allergies  Allergen Reactions  . Oxycodone Other (See Comments)  . Penicillin G Hives and Shortness Of Breath  . Penicillins   . Prednisone Cough  . Vitamin D Analogs     Chest pain  . Xanax [Alprazolam]     hyperactivity  . Fosamax [Alendronate Sodium] Nausea Only     Social History   Tobacco Use  . Smoking status: Former Games developer  . Smokeless tobacco: Never Used  Vaping Use  . Vaping Use: Never used  Substance Use Topics  . Alcohol use: No  . Drug use: No      Chart Review Today: I personally reviewed active problem list, medication list, allergies, family history, social history, health maintenance, notes from last encounter, lab results, imaging with the patient/caregiver today.   ROS:    10 Systems reviewed and are negative for acute change except as noted in the HPI.      Objective:   Vitals:   04/11/20 1441  BP: 122/74  Pulse: 75  Resp: 14  Temp: 98.3 F (36.8 C)  TempSrc: Oral  SpO2: 96%  Weight: 134 lb 8 oz (61 kg)  Height: 5\' 2"  (1.575 m)    Body mass index is 24.6 kg/m.  Physical Exam Vitals and nursing note reviewed.  Constitutional:      General: She is not in acute distress.    Appearance: Normal appearance. She is  well-developed. She is not ill-appearing, toxic-appearing or diaphoretic.     Interventions: Face mask in place.  HENT:     Head: Normocephalic and atraumatic.     Right Ear: External ear normal.     Left Ear: External ear normal.  Eyes:     General: Lids are normal. No scleral icterus.       Right eye: No discharge.        Left eye: No discharge.     Conjunctiva/sclera: Conjunctivae normal.  Neck:     Trachea: Phonation normal. No tracheal deviation.  Cardiovascular:     Rate and Rhythm: Normal rate and regular rhythm.     Pulses: Normal pulses.          Radial pulses are 2+  on the right side and 2+ on the left side.       Posterior tibial pulses are 2+ on the right side and 2+ on the left side.     Heart sounds: Murmur heard.  No friction rub. No gallop.   Pulmonary:     Effort: Pulmonary effort is normal. No respiratory distress.     Breath sounds: Normal breath sounds. No stridor. No wheezing, rhonchi or rales.  Chest:     Chest wall: No tenderness.  Abdominal:     General: Bowel sounds are normal. There is no distension.     Palpations: Abdomen is soft.     Tenderness: There is no abdominal tenderness. There is no right CVA tenderness, left CVA tenderness, guarding or rebound.  Musculoskeletal:     Right lower leg: No edema.     Left lower leg: No edema.  Skin:    General: Skin is warm and dry.     Coloration: Skin is not jaundiced or pale.     Findings: No rash.  Neurological:     Mental Status: She is alert.     Motor: No abnormal muscle tone.     Gait: Gait normal.  Psychiatric:        Mood and Affect: Mood normal.        Speech: Speech normal.        Behavior: Behavior normal.      Results for orders placed or performed in visit on 04/11/20  POCT urinalysis dipstick  Result Value Ref Range   Color, UA yellow    Clarity, UA clear    Glucose, UA Negative Negative   Bilirubin, UA negative    Ketones, UA negative    Spec Grav, UA 1.020 1.010 - 1.025   Blood, UA trace    pH, UA 5.0 5.0 - 8.0   Protein, UA Negative Negative   Urobilinogen, UA 0.2 0.2 or 1.0 E.U./dL   Nitrite, UA negative    Leukocytes, UA Trace (A) Negative   Appearance clear    Odor none        Assessment & Plan:      ICD-10-CM   1. Acute cystitis with hematuria  N30.01 POCT urinalysis dipstick    Urine Culture    levofloxacin (LEVAQUIN) 500 MG tablet    DISCONTINUED: levofloxacin (LEVAQUIN) 500 MG tablet   possible UTI/uncomplicated, exam unremarkable, pt VSS, discussed med options - discussed risk with levaquin- 5 d 500 mg d/c if culture neg      For  age and med allergy profile - not able to take penacillin or cephalasporin, for age macrobid contraindicated, will try levaquin 500 mg x 5 d, d/c early if culture negative, reviewed  possible SE/black box warning with pt and encouraged her to f/up with [place] if concerning SE.  Explained that GI sx would be normal SE. She is sensitive to meds - she asked for a zpak many times but I explained that is not indicated in suspected UTI. Reviewed other options such as waiting for urine culture results, but due to weekend, pt wanted to start treatment  UA showed trace blood and leukocytes but no nitrites   [name], PA-C 04/11/20 3:10 PM

## 2020-04-11 NOTE — Patient Instructions (Signed)
Push fluids and take antibiotics  We will call you with the urine culture results in the next 3-5 days  If its negative we will d/c antibiotics   Urinary Tract Infection, Adult A urinary tract infection (UTI) is an infection of any part of the urinary tract. The urinary tract includes:  The kidneys.  The ureters.  The bladder.  The urethra. These organs make, store, and get rid of pee (urine) in the body. What are the causes? This is caused by germs (bacteria) in your genital area. These germs grow and cause swelling (inflammation) of your urinary tract. What increases the risk? You are more likely to develop this condition if:  You have a small, thin tube (catheter) to drain pee.  You cannot control when you pee or poop (incontinence).  You are female, and: ? You use these methods to prevent pregnancy:  A medicine that kills sperm (spermicide).  A device that blocks sperm (diaphragm). ? You have low levels of a female hormone (estrogen). ? You are pregnant.  You have genes that add to your risk.  You are sexually active.  You take antibiotic medicines.  You have trouble peeing because of: ? A prostate that is bigger than normal, if you are female. ? A blockage in the part of your body that drains pee from the bladder (urethra). ? A kidney stone. ? A nerve condition that affects your bladder (neurogenic bladder). ? Not getting enough to drink. ? Not peeing often enough.  You have other conditions, such as: ? Diabetes. ? A weak disease-fighting system (immune system). ? Sickle cell disease. ? Gout. ? Injury of the spine. What are the signs or symptoms? Symptoms of this condition include:  Needing to pee right away (urgently).  Peeing often.  Peeing small amounts often.  Pain or burning when peeing.  Blood in the pee.  Pee that smells bad or not like normal.  Trouble peeing.  Pee that is cloudy.  Fluid coming from the vagina, if you are  female.  Pain in the belly or lower back. Other symptoms include:  Throwing up (vomiting).  No urge to eat.  Feeling mixed up (confused).  Being tired and grouchy (irritable).  A fever.  Watery poop (diarrhea). How is this treated? This condition may be treated with:  Antibiotic medicine.  Other medicines.  Drinking enough water. Follow these instructions at home:  Medicines  Take over-the-counter and prescription medicines only as told by your doctor.  If you were prescribed an antibiotic medicine, take it as told by your doctor. Do not stop taking it even if you start to feel better. General instructions  Make sure you: ? Pee until your bladder is empty. ? Do not hold pee for a long time. ? Empty your bladder after sex. ? Wipe from front to back after pooping if you are a female. Use each tissue one time when you wipe.  Drink enough fluid to keep your pee pale yellow.  Keep all follow-up visits as told by your doctor. This is important. Contact a doctor if:  You do not get better after 1-2 days.  Your symptoms go away and then come back. Get help right away if:  You have very bad back pain.  You have very bad pain in your lower belly.  You have a fever.  You are sick to your stomach (nauseous).  You are throwing up. Summary  A urinary tract infection (UTI) is an infection of any part  of the urinary tract.  This condition is caused by germs in your genital area.  There are many risk factors for a UTI. These include having a small, thin tube to drain pee and not being able to control when you pee or poop.  Treatment includes antibiotic medicines for germs.  Drink enough fluid to keep your pee pale yellow. This information is not intended to replace advice given to you by your health care provider. Make sure you discuss any questions you have with your health care provider. Document Revised: 07/06/2018 Document Reviewed: 01/26/2018 Elsevier  Patient Education  2020 [employer].

## 2020-04-12 LAB — URINE CULTURE
MICRO NUMBER:: 10936529
Result:: NO GROWTH
SPECIMEN QUALITY:: ADEQUATE

## 2020-05-15 DIAGNOSIS — E119 Type 2 diabetes mellitus without complications: Secondary | ICD-10-CM | POA: Diagnosis not present

## 2020-05-15 LAB — HM DIABETES EYE EXAM

## 2020-05-23 DIAGNOSIS — H524 Presbyopia: Secondary | ICD-10-CM | POA: Diagnosis not present

## 2020-07-04 ENCOUNTER — Telehealth: Payer: Self-pay

## 2020-07-04 NOTE — Telephone Encounter (Signed)
Copied from CRM [id number]. Topic: General - Other >> Jul 04, 2020 12:42 PM Lyn Hollingshead D wrote: PT returning call / no voicemail / please advise

## 2020-07-07 NOTE — Telephone Encounter (Signed)
Patient notified of labs.   

## 2020-07-17 DIAGNOSIS — R69 Illness, unspecified: Secondary | ICD-10-CM | POA: Diagnosis not present

## 2020-07-18 ENCOUNTER — Telehealth: Payer: Self-pay

## 2020-07-18 NOTE — Telephone Encounter (Signed)
Copied from CRM [id number]. Topic: General - Other >> Jul 18, 2020 11:23 AM Dalphine Handing A wrote: Patient needs documentation from [employer] for disability showing that she is a disabled person and why she is disabled. Patient is also needing handicap sticker so that she doesn't have to walk far to her car. . Patient would like a callback from [place] in regards to this.

## 2020-07-21 ENCOUNTER — Telehealth: Payer: Self-pay

## 2020-07-21 NOTE — Telephone Encounter (Signed)
Patient will bring paperwork back on Monday 12/27

## 2020-07-21 NOTE — Telephone Encounter (Signed)
lvm for pt to drop off the paperwork for Dr Dorris Fetch to look at. He is out of the office until the new year but will get it when he return Copied from CRM [id number]. Topic: General - Other >> Jul 21, 2020 10:33 AM Randol Kern wrote: Reason for CRM: [facility] needs a signed document detailing her disability from her PCP. She received a letter in the mail. Please advise, she will go to [facility] to request further Medical Records.

## 2020-07-21 NOTE — Telephone Encounter (Signed)
Patient stated that her disability is knee problems.

## 2020-07-22 NOTE — Telephone Encounter (Signed)
Pt has seen Dr. Dorris Fetch (did not want to see me and she told me she switched PCP to him?) and she has been to Odis Luster at [facility] for her knee issues - they have both seen her for it and documented  I saw her for hand issues and urinary sx (2 acute visits)  she can drop off forms for Dr. Rexene Edison or me too look- if I haven't assessed her for it before she may have to get at appt for assessment, records review and to complete form s - but since we don't do disability forms she may want to get the forms to [place] first to check out

## 2020-08-07 ENCOUNTER — Telehealth: Payer: Self-pay | Admitting: Family Medicine

## 2020-08-11 ENCOUNTER — Other Ambulatory Visit: Payer: Self-pay | Admitting: Family Medicine

## 2020-08-11 DIAGNOSIS — E1169 Type 2 diabetes mellitus with other specified complication: Secondary | ICD-10-CM

## 2020-08-11 DIAGNOSIS — E119 Type 2 diabetes mellitus without complications: Secondary | ICD-10-CM

## 2020-08-11 DIAGNOSIS — E785 Hyperlipidemia, unspecified: Secondary | ICD-10-CM

## 2020-08-11 MED ORDER — METFORMIN HCL 500 MG PO TABS: 500.0000 mg | ORAL_TABLET | Freq: Every day | ORAL | 0 refills | Status: DC

## 2020-08-11 NOTE — Telephone Encounter (Signed)
Medication Refill - Medication: metFORMIN (GLUCOPHAGE) 500 MG tablet    Has the patient contacted their pharmacy? yes (Agent: If no, request that the patient contact the pharmacy for the refill.) (Agent: If yes, when and what did the pharmacy advise?)Contact PCP  Preferred Pharmacy (with phone number or street name):  [facility] [place], [place] - [address] Phone:  [phone]  Fax:  [phone]       Agent: Please be advised that RX refills may take up to 3 business days. We ask that you follow-up with your pharmacy.

## 2020-08-29 ENCOUNTER — Ambulatory Visit (INDEPENDENT_AMBULATORY_CARE_PROVIDER_SITE_OTHER): Payer: Medicare HMO | Admitting: Family Medicine

## 2020-08-29 ENCOUNTER — Ambulatory Visit
Admission: RE | Admit: 2020-08-29 | Discharge: 2020-08-29 | Disposition: A | Discharge status: Home / Self Care | Payer: Medicare HMO | Source: Ambulatory Visit | Attending: Family Medicine | Admitting: Family Medicine

## 2020-08-29 ENCOUNTER — Encounter: Payer: Self-pay | Admitting: Family Medicine

## 2020-08-29 ENCOUNTER — Other Ambulatory Visit: Payer: Self-pay

## 2020-08-29 VITALS — BP 122/84 | HR 92 | Temp 98.2°F | Resp 16 | Ht 62.0 in | Wt 135.2 lb

## 2020-08-29 DIAGNOSIS — M4184 Other forms of scoliosis, thoracic region: Secondary | ICD-10-CM | POA: Diagnosis not present

## 2020-08-29 DIAGNOSIS — M5030 Other cervical disc degeneration, unspecified cervical region: Secondary | ICD-10-CM

## 2020-08-29 DIAGNOSIS — M4802 Spinal stenosis, cervical region: Secondary | ICD-10-CM

## 2020-08-29 DIAGNOSIS — E782 Mixed hyperlipidemia: Secondary | ICD-10-CM | POA: Diagnosis not present

## 2020-08-29 DIAGNOSIS — E1169 Type 2 diabetes mellitus with other specified complication: Secondary | ICD-10-CM | POA: Diagnosis not present

## 2020-08-29 DIAGNOSIS — M419 Scoliosis, unspecified: Secondary | ICD-10-CM

## 2020-08-29 DIAGNOSIS — M47814 Spondylosis without myelopathy or radiculopathy, thoracic region: Secondary | ICD-10-CM | POA: Diagnosis not present

## 2020-08-29 DIAGNOSIS — M549 Dorsalgia, unspecified: Secondary | ICD-10-CM

## 2020-08-29 DIAGNOSIS — Z5181 Encounter for therapeutic drug level monitoring: Secondary | ICD-10-CM | POA: Diagnosis not present

## 2020-08-29 DIAGNOSIS — E119 Type 2 diabetes mellitus without complications: Secondary | ICD-10-CM | POA: Diagnosis not present

## 2020-08-29 DIAGNOSIS — M1712 Unilateral primary osteoarthritis, left knee: Secondary | ICD-10-CM

## 2020-08-29 DIAGNOSIS — M542 Cervicalgia: Secondary | ICD-10-CM

## 2020-08-29 DIAGNOSIS — M810 Age-related osteoporosis without current pathological fracture: Secondary | ICD-10-CM

## 2020-08-29 DIAGNOSIS — E785 Hyperlipidemia, unspecified: Secondary | ICD-10-CM

## 2020-08-29 NOTE — Progress Notes (Signed)
Name: Kiara Brady   MRN: [id number]    DOB: 1945/12/06   Date:08/29/2020       Progress Note  Chief Complaint  Patient presents with  . Follow-up  . Hyperlipidemia  . Diabetes     Subjective:   Kiara Brady is a 75 y.o. female, presents to clinic for routine f/up  DM:   Pt managing DM with metformin 500 mg once daily Reports good med compliance Pt has no SE from meds. Blood sugars - not checking Denies: Polyuria, polydipsia, vision changes, neuropathy, hypoglycemia Recent pertinent labs: Lab Results  Component Value Date   HGBA1C 6.4 (H) 02/27/2020   HGBA1C 6.8 (H) 08/31/2019   HGBA1C 6.1 (H) 02/27/2019   Standard of care and health maintenance: Urine Microalbumin:  due Foot exam:  due DM eye exam:  due ACEI/ARB:  Not on  Statin:  lipitor  Hyperlipidemia: Currently treated with lipitor 10 mg, pt reports good med compliance Last Lipids: Lab Results  Component Value Date   CHOL 152 08/31/2019   HDL 65 08/31/2019   LDLCALC 64 08/31/2019   TRIG 142 08/31/2019   CHOLHDL 2.3 08/31/2019   - Denies: Chest pain, shortness of breath, myalgias, claudication  Osteoporosis - dexa 01/2018 showed osteoporosis, pt is not on meds, she did have a fracture last year  Due for 2 year f/up dexa Hx of Vit D allergy and alendronate intolerance   She complains of neck, back shoulder pain - hurts for years, she hurts at night and in the morning - she wants xrays and says she doesn't like physical therapy     Current Outpatient Medications:  .  atorvastatin (LIPITOR) 10 MG tablet, Take 1 tablet (10 mg total) by mouth at bedtime., Disp: 90 tablet, Rfl: 3 .  levofloxacin (LEVAQUIN) 500 MG tablet, Take 1 tablet (500 mg total) by mouth daily. (Patient not taking: Reported on 08/29/2020), Disp: 5 tablet, Rfl: 0 .  metFORMIN (GLUCOPHAGE) 500 MG tablet, Take 1 tablet (500 mg total) by mouth daily with breakfast., Disp: 90 tablet, Rfl: 0  Patient Active Problem List   Diagnosis Date  Noted  . Lower abdominal pain 11/15/2019  . Osteoporosis 02/06/2018  . 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 Osteoarthritis of knees, 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  . Hyperlipidemia 03/23/2016  . Cardiac murmur, previously undiagnosed 01/23/2016  . Heart murmur 09/03/2015  . Chronic pain of both knees 06/30/2015  . Dyslipidemia associated with type 2 diabetes mellitus (HCC) 02/26/2015  . Diabetes mellitus, type 2 (HCC) 01/06/2015    Past Surgical History:  Procedure Laterality Date  . VARICOSE VEIN SURGERY      Family History  Problem Relation Age of Onset  . Heart disease Mother   . Alzheimer's disease Mother   . Heart attack Mother   . Alcohol abuse Father   . Cancer Maternal Grandmother        breast  . Breast cancer Maternal Grandmother   . Diabetes Sister     Social History   Tobacco Use  . Smoking status: Former Games developer  . Smokeless tobacco: Never Used  Vaping Use  . Vaping Use: Never used  Substance Use Topics  . Alcohol use: No  . Drug use: No     Allergies  Allergen Reactions  . Oxycodone Other (See Comments)  . Penicillin G Hives and Shortness Of Breath  . Penicillins   . Prednisone Cough  . Vitamin D Analogs     Chest  pain  . Xanax [Alprazolam]     hyperactivity  . Fosamax [Alendronate Sodium] Nausea Only    Health Maintenance  Topic Date Due  . OPHTHALMOLOGY EXAM  01/15/2019  . HEMOGLOBIN A1C  08/29/2020  . FOOT EXAM  08/30/2020  . URINE MICROALBUMIN  02/26/2021  . MAMMOGRAM  03/17/2021  . COLONOSCOPY (Pts 45-75yrs Insurance coverage will need to be confirmed)  06/03/2021  . TETANUS/TDAP  08/24/2028  . INFLUENZA VACCINE  Completed  . DEXA SCAN  Completed  . COVID-19 Vaccine  Completed  . Hepatitis C Screening  Completed  . PNA vac Low Risk Adult  Completed    Chart Review Today: I personally reviewed active problem list, medication list, allergies, family history, social history, health maintenance, notes from last  encounter, lab results, imaging with the patient/caregiver today.   Review of Systems  Constitutional: Negative.   HENT: Negative.   Eyes: Negative.   Respiratory: Negative.   Cardiovascular: Negative.   Gastrointestinal: Negative.   Endocrine: Negative.   Genitourinary: Negative.   Musculoskeletal: Negative.   Skin: Negative.   Allergic/Immunologic: Negative.   Neurological: Negative.   Hematological: Negative.   Psychiatric/Behavioral: Negative.   All other systems reviewed and are negative.    Objective:   Vitals:   08/29/20 1402  BP: 122/84  Pulse: 92  Resp: 16  Temp: 98.2 F (36.8 C)  SpO2: 98%  Weight: 135 lb 3.2 oz (61.3 kg)  Height: 5\' 2"  (1.575 m)    Body mass index is 24.73 kg/m.  Physical Exam Vitals and nursing note reviewed.  Constitutional:      General: She is not in acute distress.    Appearance: Normal appearance. She is well-developed and normal weight. She is not ill-appearing, toxic-appearing or diaphoretic.     Interventions: Face mask in place.  HENT:     Head: Normocephalic and atraumatic.     Right Ear: External ear normal.     Left Ear: External ear normal.  Eyes:     General: Lids are normal. No scleral icterus.       Right eye: No discharge.        Left eye: No discharge.     Conjunctiva/sclera: Conjunctivae normal.  Neck:     Trachea: Trachea and phonation normal. No tracheal deviation.  Cardiovascular:     Rate and Rhythm: Normal rate and regular rhythm.     Pulses: Normal pulses.          Radial pulses are 2+ on the right side and 2+ on the left side.       Posterior tibial pulses are 2+ on the right side and 2+ on the left side.     Heart sounds: Normal heart sounds. No murmur heard. No friction rub. No gallop.   Pulmonary:     Effort: Pulmonary effort is normal. No respiratory distress.     Breath sounds: Normal breath sounds. No stridor. No wheezing, rhonchi or rales.  Chest:     Chest wall: No tenderness.  Abdominal:      General: Bowel sounds are normal. There is no distension.     Palpations: Abdomen is soft.  Musculoskeletal:     Cervical back: Normal range of motion. No edema or rigidity. No spinous process tenderness. Normal range of motion.     Right lower leg: No edema.     Left lower leg: No edema.  Skin:    General: Skin is warm and dry.     Coloration:  Skin is not jaundiced or pale.     Findings: No rash.  Neurological:     Mental Status: She is alert.     Sensory: Sensation is intact.     Motor: No abnormal muscle tone.     Gait: Gait normal.     Comments: 5/5 bilateral symmetrical grip strength  Psychiatric:        Mood and Affect: Mood normal.        Speech: Speech normal.        Behavior: Behavior normal.      Diabetic Foot Exam - Simple   Simple Foot Form Diabetic Foot exam was performed with the following findings: Yes 08/29/2020  8:17 PM  Visual Inspection Sensation Testing Pulse Check Comments       Assessment & Plan:     ICD-10-CM   1. Type 2 diabetes mellitus without complication, without long-term current use of insulin (HCC)  E11.9 Hemoglobin A1C    COMPLETE METABOLIC PANEL WITH GFR    Microalbumin, urine   has been well controlled, DM foot exam done today, due for eye exam - pt states recently done - obtain records, labs due  2. Mixed hyperlipidemia  E78.2 Lipid panel    COMPLETE METABOLIC PANEL WITH GFR   good compliance, no myalgias, due for labs  3. Encounter for medication monitoring  Z51.81 Hemoglobin A1C    Lipid panel    COMPLETE METABOLIC PANEL WITH GFR    CBC with Differential/Platelet    Microalbumin, urine  4. Primary osteoarthritis of left knee  M17.12    seeing ortho, feels good currently w/o swelling or problems with gait/mobility, using OTC topical meds  5. Age-related osteoporosis without current pathological fracture  M81.0 COMPLETE METABOLIC PANEL WITH GFR    DG Bone Density   known osteoporosis - pt due for f/up dexa, will likely need  specialist referral due to allergies/consult for prolia injections  6. Dyslipidemia associated with type 2 diabetes mellitus (HCC)  E11.69 Lipid panel   E78.5 COMPLETE METABOLIC PANEL WITH GFR   see above  7. Medication monitoring encounter  Z51.81 Hemoglobin A1C    Lipid panel    COMPLETE METABOLIC PANEL WITH GFR    CBC with Differential/Platelet    Microalbumin, urine  8. Neck pain  M54.2 DG Cervical Spine Complete   good ROM, no tenderness, likely arthritic/degenerative changes/normal aging - explained screening xray, may need PT and/or specialists  9. Upper back pain  M54.9 DG Thoracic Spine 4V    DG Thoracic Spine 2 View    CANCELED: DG Thoracic Spine 2 View   complains of pain through upper shoulders, back, trapezius area and neck     Return in about 6 months (around 02/26/2021) for Routine follow-up.   [name], PA-C 08/29/20 2:29 PM

## 2020-08-29 NOTE — Patient Instructions (Addendum)
Please call [place] to schedule your Dexa - it is past time to recheck your bone density  We may need you to follow up with a specialists to help manage this since you have allergies to fosamax and vit D supplements  [facility] [place],  [place]  [address] Get Driving Directions Main: [phone]  Due for eye exam - you should get a call with a referral to help you schedule and complete this Health Maintenance  Topic Date Due  . Eye exam for diabetics  01/15/2019  . DEXA scan (bone density measurement)  02/01/2020  . Hemoglobin A1C  08/29/2020  . Complete foot exam   08/30/2020  . Urine Protein Check  02/26/2021  . Mammogram  03/17/2021  . Colon Cancer Screening  06/03/2021  . Tetanus Vaccine  08/24/2028  . Flu Shot  Completed  . COVID-19 Vaccine  Completed  .  Hepatitis C: One time screening is recommended by Center for Disease Control  (CDC) for  adults born from [birth year] through 1966.   Completed  . Pneumonia vaccines  Completed      Osteoporosis  Osteoporosis is when the bones get thin and weak. This can cause your bones to break (fracture) more easily. What are the causes? The exact cause of this condition is not known. What increases the risk?  Having family members with this condition.  Not eating enough healthy foods.  Taking certain medicines.  Being female.  Being age 82 or older.  Smoking or using other products that contain nicotine or tobacco, such as e-cigarettes or chewing tobacco.  Not exercising.  Being of European or Asian ancestry.  Having a small body frame. What are the signs or symptoms? A broken bone might be the first sign, especially if the break results from a fall or injury that usually would not cause a bone to break. Other signs and symptoms include:  Pain in the neck or low back.  Being hunched over (stooped posture).  Getting shorter. How is this treated?  Eating more foods  with more calcium and vitamin D in them.  Doing exercises.  Stopping tobacco use.  Limiting how much alcohol you drink.  Taking medicines to slow bone loss or help make the bones stronger.  Taking supplements of calcium and vitamin D every day.  Taking medicines to replace chemicals in the body (hormone replacement medicines).  Monitoring your levels of calcium and vitamin D. The goal of treatment is to strengthen your bones and lower your risk for a bone break. Follow these instructions at home: Eating and drinking Eat plenty of calcium and vitamin D. These nutrients are good for your bones. Good sources of calcium and vitamin D include:  Some fish, such as salmon and tuna.  Foods that have calcium and vitamin D added to them (fortified foods), such as some breakfast cereals.  Egg yolks.  Cheese.  Liver.   Activity Do exercises as told by your doctor. Ask your doctor what exercises are safe for you. You should do:  Exercises that make your muscles work to hold your body weight up (weight-bearing exercises). These include tai chi, yoga, and walking.  Exercises to make your muscles stronger. One example is lifting weights. Lifestyle  Do not drink alcohol if: ? Your doctor tells you not to drink. ? You are pregnant, may be pregnant, or are planning to become pregnant.  If you drink alcohol: ? Limit how much you use  to:  0-1 drink a day for women.  0-2 drinks a day for men.  Know how much alcohol is in your drink. In the [place] one drink equals one 12 oz bottle of beer (355 mL), one 5 oz glass of wine (148 mL), or one 1 oz glass of hard liquor (44 mL).  Do not smoke or use any products that contain nicotine or tobacco. If you need help quitting, ask your doctor. Preventing falls  Use tools to help you move around (mobility aids) as needed. These include canes, walkers, scooters, and crutches.  Keep rooms well-lit.  Put away things on the floor that could make you  trip. These include cords and rugs.  Install safety rails on stairs. Install grab bars in bathrooms.  Use rubber mats in slippery areas, like bathrooms.  Wear shoes that: ? Fit you well. ? Support your feet. ? Have closed toes. ? Have rubber soles or low heels.  Tell your doctor about all of the medicines you are taking. Some medicines can make you more likely to fall. General instructions  Take over-the-counter and prescription medicines only as told by your doctor.  Keep all follow-up visits. Contact a doctor if:  You have not been tested (screened) for osteoporosis and you are: ? A woman who is age 68 or older. ? A man who is age 69 or older. Get help right away if:  You fall.  You get hurt. Summary  Osteoporosis happens when your bones get thin and weak.  Weak bones can break (fracture) more easily.  Eat plenty of calcium and vitamin D. These are good for your bones.  Tell your doctor about all of the medicines that you take. This information is not intended to replace advice given to you by your health care provider. Make sure you discuss any questions you have with your health care provider. Document Revised: 01/03/2020 Document Reviewed: 01/03/2020 Elsevier Patient Education  [place].   Diabetes Mellitus Basics  Diabetes mellitus, or diabetes, is a long-term (chronic) disease. It occurs when the body does not properly use sugar (glucose) that is released from food after you eat. Diabetes mellitus may be caused by one or both of these problems:  Your pancreas does not make enough of a hormone called insulin.  Your body does not react in a normal way to the insulin that it makes. Insulin lets glucose enter cells in your body. This gives you energy. If you have diabetes, glucose cannot get into cells. This causes high blood glucose (hyperglycemia). How to treat and manage diabetes You may need to take insulin or other diabetes medicines daily to keep  your glucose in balance. If you are prescribed insulin, you will learn how to give yourself insulin by injection. You may need to adjust the amount of insulin you take based on the foods that you eat. You will need to check your blood glucose levels using a glucose monitor as told by your health care provider. The readings can help determine if you have low or high blood glucose. Generally, you should have these blood glucose levels:  Before meals (preprandial): 80-130 mg/dL (4.4-7.2 mmol/L).  After meals (postprandial): below 180 mg/dL (10 mmol/L).  Hemoglobin A1c (HbA1c) level: less than 7%. Your health care provider will set treatment goals for you. Keep all follow-up visits. This is important. Follow these instructions at home: Diabetes medicines Take your diabetes medicines every day as told by your health care provider. List your diabetes  medicines here:  Name of medicine: ______________________________ ? Amount (dose): _______________ Time (a.m./p.m.): _______________ Notes: ___________________________________  Name of medicine: ______________________________ ? Amount (dose): _______________ Time (a.m./p.m.): _______________ Notes: ___________________________________  Name of medicine: ______________________________ ? Amount (dose): _______________ Time (a.m./p.m.): _______________ Notes: ___________________________________ Insulin If you use insulin, list the types of insulin you use here:  Insulin type: ______________________________ ? Amount (dose): _______________ Time (a.m./p.m.): _______________Notes: ___________________________________  Insulin type: ______________________________ ? Amount (dose): _______________ Time (a.m./p.m.): _______________ Notes: ___________________________________  Insulin type: ______________________________ ? Amount (dose): _______________ Time (a.m./p.m.): _______________ Notes: ___________________________________  Insulin type:  ______________________________ ? Amount (dose): _______________ Time (a.m./p.m.): _______________ Notes: ___________________________________  Insulin type: ______________________________ ? Amount (dose): _______________ Time (a.m./p.m.): _______________ Notes: ___________________________________ Managing blood glucose Check your blood glucose levels using a glucose monitor as told by your health care provider. Write down the times that you check your glucose levels here:  Time: _______________ Notes: ___________________________________  Time: _______________ Notes: ___________________________________  Time: _______________ Notes: ___________________________________  Time: _______________ Notes: ___________________________________  Time: _______________ Notes: ___________________________________  Time: _______________ Notes: ___________________________________   Low blood glucose Low blood glucose (hypoglycemia) is when glucose is at or below 70 mg/dL (3.9 mmol/L). Symptoms may include:  Feeling: ? Hungry. ? Sweaty and clammy. ? Irritable or easily upset. ? Dizzy. ? Sleepy.  Having: ? A fast heartbeat. ? A headache. ? A change in your vision. ? Numbness around the mouth, lips, or tongue.  Having trouble with: ? Moving (coordination). ? Sleeping. Treating low blood glucose To treat low blood glucose, eat or drink something containing sugar right away. If you can think clearly and swallow safely, follow the 15:15 rule:  Take 15 grams of a fast-acting carb (carbohydrate), as told by your health care provider.  Some fast-acting carbs are: ? Glucose tablets: take 3-4 tablets. ? Hard candy: eat 3-5 pieces. ? Fruit juice: drink 4 oz (120 mL). ? Regular (not diet) soda: drink 4-6 oz (120-180 mL). ? Honey or sugar: eat 1 Tbsp (15 mL).  Check your blood glucose levels 15 minutes after you take the carb.  If your glucose is still at or below 70 mg/dL (3.9 mmol/L), take 15  grams of a carb again.  If your glucose does not go above 70 mg/dL (3.9 mmol/L) after 3 tries, get help right away.  After your glucose goes back to normal, eat a meal or a snack within 1 hour. Treating very low blood glucose If your glucose is at or below 54 mg/dL (3 mmol/L), you have very low blood glucose (severe hypoglycemia). This is an emergency. Do not wait to see if the symptoms will go away. Get medical help right away. Call your local emergency services (911 in the [place] Do not drive yourself to the hospital. Questions to ask your health care provider  Should I talk with a diabetes educator?  What equipment will I need to care for myself at home?  What diabetes medicines do I need? When should I take them?  How often do I need to check my blood glucose levels?  What number can I call if I have questions?  When is my follow-up visit?  Where can I find a support group for people with diabetes? Where to find more information  American Diabetes Association: www.diabetes.org  Association of Diabetes Care and Education Specialists: www.diabeteseducator.org Contact a health care provider if:  Your blood glucose is at or above 240 mg/dL (13.3 mmol/L) for 2 days in a row.  You have been sick  or have had a fever for 2 days or more, and you are not getting better.  You have any of these problems for more than 6 hours: ? You cannot eat or drink. ? You feel nauseous. ? You vomit. ? You have diarrhea. Get help right away if:  Your blood glucose is lower than 54 mg/dL (3 mmol/L).  You get confused.  You have trouble thinking clearly.  You have trouble breathing. These symptoms may represent a serious problem that is an emergency. Do not wait to see if the symptoms will go away. Get medical help right away. Call your local emergency services (911 in the [place] Do not drive yourself to the hospital. Summary  Diabetes mellitus is a chronic disease that occurs when the body  does not properly use sugar (glucose) that is released from food after you eat.  Take insulin and diabetes medicines as told.  Check your blood glucose every day, as often as told.  Keep all follow-up visits. This is important. This information is not intended to replace advice given to you by your health care provider. Make sure you discuss any questions you have with your health care provider. Document Revised: 11/20/2019 Document Reviewed: 11/20/2019 Elsevier Patient Education  2021 [employer].

## 2020-08-30 LAB — CBC WITH DIFFERENTIAL/PLATELET
Absolute Monocytes: 540 cells/uL (ref 200–950)
Basophils Absolute: 36 cells/uL (ref 0–200)
Basophils Relative: 0.4 %
Eosinophils Absolute: 144 cells/uL (ref 15–500)
Eosinophils Relative: 1.6 %
HCT: 37.6 % (ref 35.0–45.0)
Hemoglobin: 12.6 g/dL (ref 11.7–15.5)
Lymphs Abs: 2970 cells/uL (ref 850–3900)
MCH: 31.1 pg (ref 27.0–33.0)
MCHC: 33.5 g/dL (ref 32.0–36.0)
MCV: 92.8 fL (ref 80.0–100.0)
MPV: 9.9 fL (ref 7.5–12.5)
Monocytes Relative: 6 %
Neutro Abs: 5310 cells/uL (ref 1500–7800)
Neutrophils Relative %: 59 %
Platelets: 367 10*3/uL (ref 140–400)
RBC: 4.05 10*6/uL (ref 3.80–5.10)
RDW: 11.9 % (ref 11.0–15.0)
Total Lymphocyte: 33 %
WBC: 9 10*3/uL (ref 3.8–10.8)

## 2020-08-30 LAB — COMPLETE METABOLIC PANEL WITH GFR
AG Ratio: 1.5 (calc) (ref 1.0–2.5)
ALT: 12 U/L (ref 6–29)
AST: 16 U/L (ref 10–35)
Albumin: 4.4 g/dL (ref 3.6–5.1)
Alkaline phosphatase (APISO): 72 U/L (ref 37–153)
BUN: 10 mg/dL (ref 7–25)
CO2: 30 mmol/L (ref 20–32)
Calcium: 9.6 mg/dL (ref 8.6–10.4)
Chloride: 104 mmol/L (ref 98–110)
Creat: 0.85 mg/dL (ref 0.60–0.93)
GFR, Est African American: 78 mL/min/{1.73_m2} (ref >=60)
GFR, Est Non African American: 67 mL/min/{1.73_m2} (ref >=60)
Globulin: 2.9 g/dL (calc) (ref 1.9–3.7)
Glucose, Bld: 113 mg/dL — ABNORMAL HIGH (ref 65–99)
Potassium: 3.4 mmol/L — ABNORMAL LOW (ref 3.5–5.3)
Sodium: 142 mmol/L (ref 135–146)
Total Bilirubin: 0.5 mg/dL (ref 0.2–1.2)
Total Protein: 7.3 g/dL (ref 6.1–8.1)

## 2020-08-30 LAB — HEMOGLOBIN A1C
Hgb A1c MFr Bld: 6.7 % of total Hgb — ABNORMAL HIGH (ref <5.7)
Mean Plasma Glucose: 146 mg/dL
eAG (mmol/L): 8.1 mmol/L

## 2020-08-30 LAB — LIPID PANEL
Cholesterol: 173 mg/dL (ref <200)
HDL: 68 mg/dL (ref >=50)
LDL Cholesterol (Calc): 86 mg/dL (calc)
Non-HDL Cholesterol (Calc): 105 mg/dL (calc) (ref <130)
Total CHOL/HDL Ratio: 2.5 (calc) (ref <5.0)
Triglycerides: 97 mg/dL (ref <150)

## 2020-08-30 LAB — MICROALBUMIN, URINE: Microalb, Ur: 1.1 mg/dL

## 2020-09-02 ENCOUNTER — Encounter: Payer: Self-pay | Admitting: Family Medicine

## 2020-09-02 NOTE — Addendum Note (Signed)
Addended by: [name] on: 09/02/2020 06:49 PM   Modules accepted: Orders

## 2020-09-22 ENCOUNTER — Ambulatory Visit: Payer: Self-pay | Admitting: *Deleted

## 2020-09-22 NOTE — Telephone Encounter (Signed)
Patient feels memory is worsening. Has had forgetful moments for years. Today, unable to remember friend's last name, phone# and address. No other symptoms.   Denies any recent accidents/falls.She is asking about Prevagin (as seen on TV) or other to help with her memory loss. Appointment with neurosurgeon 10/07/20 for possible MRI of back.  Appointment made, okay per DT for 09/26/20   Answer Assessment - Initial Assessment Questions 1. SYMPTOM: "What is the main symptom you are concerned about?" (e.g., weakness, numbness)    Memory loss lately 2. ONSET: "When did this start?" (minutes, hours, days; while sleeping)     Long time ago but worsening lately.  3. LAST NORMAL: "When was the last time you were normal (no symptoms)?"     unsure 4. PATTERN "Does this come and go, or has it been constant since it started?"  "Is it present now?"     Comes and goes 5. CARDIAC SYMPTOMS: "Have you had any of the following symptoms: chest pain, difficulty breathing, palpitations?"     bi6. NEUROLOGIC SYMPTOMS: "Have you had any of the following symptoms: headache, dizziness, vision loss, double vision, changes in speech, unsteady on your feet?"     none 7. OTHER SYMPTOMS: "Do you have any other symptoms?"     none 8. PREGNANCY: "Is there any chance you are pregnant?" "When was your last menstrual period?"     na  Protocols used: NEUROLOGIC DEFICIT-A-AH

## 2020-09-24 NOTE — Progress Notes (Unsigned)
Name: Kiara Brady   MRN: [id number]    DOB: Jan 20, 1946   Date:09/26/2020       Progress Note  Subjective  Chief Complaint  Memory Issues  HPI  Memory concern: she states she is independent, able to do all ADL and instrumental activities of daily living. She  states years ago she noticed that she has to stop her car when going places to make sure she can get to the location she desires, but that is stable. However she states last week she forgot her friend's last name, she also states she could not remember her address but able to drive to her place. She denies any other neuro deficit. No problems since last week.    She lives alone with her dog and a bird. She has been isolated since the pandemic.   She states she is worried because her mother had dementia  Normal MMSE.   She sates she used to take computer classes at [employer], not going out due to the pandemic , explained interaction with people is important for our health. 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 Explained anxiety can cause memory problems also   Patient Active Problem List   Diagnosis Date Noted  . Osteoporosis 02/06/2018  . 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 Osteoarthritis of knees, 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  . Hyperlipidemia 03/23/2016  . Cardiac murmur, previously undiagnosed 01/23/2016  . Heart murmur 09/03/2015  . Chronic pain of both knees 06/30/2015  . Dyslipidemia associated with type 2 diabetes mellitus (HCC) 02/26/2015  . Diabetes mellitus, type 2 (HCC) 01/06/2015    Past Surgical History:  Procedure Laterality Date  . VARICOSE VEIN SURGERY      Family History  Problem Relation Age of Onset  . Heart disease Mother   . Alzheimer's disease Mother   . Heart attack Mother   . Alcohol abuse Father   . Cancer Maternal Grandmother        breast  . Breast cancer Maternal Grandmother   . Diabetes Sister     Social History   Tobacco Use  .  Smoking status: Former Games developer  . Smokeless tobacco: Never Used  Substance Use Topics  . Alcohol use: No     Current Outpatient Medications:  .  atorvastatin (LIPITOR) 10 MG tablet, Take 1 tablet (10 mg total) by mouth at bedtime., Disp: 90 tablet, Rfl: 3 .  metFORMIN (GLUCOPHAGE) 500 MG tablet, Take 1 tablet (500 mg total) by mouth daily with breakfast., Disp: 90 tablet, Rfl: 0  Allergies  Allergen Reactions  . Oxycodone Other (See Comments)  . Penicillin G Hives and Shortness Of Breath  . Penicillins   . Prednisone Cough  . Vitamin D Analogs     Chest pain  . Xanax [Alprazolam]     hyperactivity  . Fosamax [Alendronate Sodium] Nausea Only    I personally reviewed active problem list, medication list, allergies, family history, social history, health maintenance with the patient/caregiver today.   ROS  Ten systems reviewed and is negative except as mentioned in HPI   Objective  Vitals:   09/26/20 1305  BP: 112/70  Pulse: 86  Resp: 16  Temp: 98.3 F (36.8 C)  TempSrc: Oral  SpO2: 96%  Weight: 134 lb (60.8 kg)  Height: 5\' 2"  (1.575 m)    Body mass index is 24.51 kg/m.  Physical Exam  Constitutional: Patient appears well-developed and well-nourished. No distress.  HEENT: head atraumatic, normocephalic, pupils equal and reactive to light,neck supple Cardiovascular: Normal rate, regular rhythm and normal heart sounds.  No murmur heard. No BLE edema. Pulmonary/Chest: Effort normal and breath sounds normal. No respiratory distress. Abdominal: Soft.  There is no tenderness. Neurological: no focal findings.  Psychiatric: Patient has a normal mood and affect. behavior is normal. Judgment and thought content normal.  Recent Results (from the past 2160 hour(s))  Hemoglobin A1C     Status: Abnormal   Collection Time: 08/29/20  2:42 PM  Result Value Ref Range   Hgb A1c MFr Bld 6.7 (H) <5.7 % of total Hgb    Comment: For someone without known diabetes, a hemoglobin  A1c value of 6.5% or greater indicates that they may have  diabetes and this should be confirmed with a follow-up  test. . For someone with known diabetes, a value <7% indicates  that their diabetes is well controlled and a value  greater than or equal to 7% indicates suboptimal  control. A1c targets should be individualized based on  duration of diabetes, age, comorbid conditions, and  other considerations. . Currently, no consensus exists regarding use of hemoglobin A1c for diagnosis of diabetes for children. .    Mean Plasma Glucose 146 mg/dL   eAG (mmol/L) 8.1 mmol/L  Lipid panel     Status: None   Collection Time: 08/29/20  2:42 PM  Result Value Ref Range   Cholesterol 173 <200 mg/dL   HDL 68 > OR = 50 mg/dL   Triglycerides 97 <700 mg/dL   LDL Cholesterol (Calc) 86 mg/dL (calc)    Comment: Reference range: <100 . Desirable range <100 mg/dL for primary prevention;   <70 mg/dL for patients with CHD or diabetic patients  with > or = 2 CHD risk factors. Marland Kitchen LDL-C is now calculated using the Martin-Hopkins  calculation, which is a validated novel method providing  better accuracy than the Friedewald equation in the  estimation of LDL-C.  Horald Pollen et al. Lenox Ahr. 1749;449(67): 2061-2068  (http://education.[facility].com/faq/FAQ164)    Total CHOL/HDL Ratio 2.5 <5.0 (calc)   Non-HDL Cholesterol (Calc) 105 <130 mg/dL (calc)    Comment: For patients with diabetes plus 1 major ASCVD risk  factor, treating to a non-HDL-C goal of <100 mg/dL  (LDL-C of <59 mg/dL) is considered a therapeutic  option.   COMPLETE METABOLIC PANEL WITH GFR     Status: Abnormal   Collection Time: 08/29/20  2:42 PM  Result Value Ref Range   Glucose, Bld 113 (H) 65 - 99 mg/dL    Comment: .            Fasting reference interval . For someone without known diabetes, a glucose value between 100 and 125 mg/dL is consistent with prediabetes and should be confirmed with a follow-up test. .    BUN  10 7 - 25 mg/dL   Creat 1.63 8.46 - 6.59 mg/dL    Comment: For patients >22 years of age, the reference limit for Creatinine is approximately 13% higher for people identified as African-American. .    GFR, Est Non African American 67 > OR = 60 mL/min/1.75m2   GFR, Est African American 78 > OR = 60 mL/min/1.47m2   BUN/Creatinine Ratio NOT APPLICABLE 6 - 22 (calc)   Sodium 142 135 - 146 mmol/L   Potassium 3.4 (L) 3.5 - 5.3 mmol/L   Chloride 104 98 - 110 mmol/L   CO2 30 20 - 32 mmol/L   Calcium 9.6 8.6 -  10.4 mg/dL   Total Protein 7.3 6.1 - 8.1 g/dL   Albumin 4.4 3.6 - 5.1 g/dL   Globulin 2.9 1.9 - 3.7 g/dL (calc)   AG Ratio 1.5 1.0 - 2.5 (calc)   Total Bilirubin 0.5 0.2 - 1.2 mg/dL   Alkaline phosphatase (APISO) 72 37 - 153 U/L   AST 16 10 - 35 U/L   ALT 12 6 - 29 U/L  CBC with Differential/Platelet     Status: None   Collection Time: 08/29/20  2:42 PM  Result Value Ref Range   WBC 9.0 3.8 - 10.8 Thousand/uL   RBC 4.05 3.80 - 5.10 Million/uL   Hemoglobin 12.6 11.7 - 15.5 g/dL   HCT 16.137.6 09.635.0 - 04.545.0 %   MCV 92.8 80.0 - 100.0 fL   MCH 31.1 27.0 - 33.0 pg   MCHC 33.5 32.0 - 36.0 g/dL   RDW 40.911.9 81.111.0 - 91.415.0 %   Platelets 367 140 - 400 Thousand/uL   MPV 9.9 7.5 - 12.5 fL   Neutro Abs 5,310 1,500 - 7,800 cells/uL   Lymphs Abs 2,970 850 - 3,900 cells/uL   Absolute Monocytes 540 200 - 950 cells/uL   Eosinophils Absolute 144 15 - 500 cells/uL   Basophils Absolute 36 0 - 200 cells/uL   Neutrophils Relative % 59 %   Total Lymphocyte 33.0 %   Monocytes Relative 6.0 %   Eosinophils Relative 1.6 %   Basophils Relative 0.4 %  Microalbumin, urine     Status: None   Collection Time: 08/29/20  2:42 PM  Result Value Ref Range   Microalb, Ur 1.1 mg/dL    Comment: Reference Range Not established    RAM      Comment: . The ADA defines abnormalities in albumin excretion as follows: Marland Kitchen. Albuminuria Category         Result (mcg/mg creatinine) . Normal to Mildly increased     <30 Moderately increased          30-299  Severely increased            > OR = 300 . The ADA recommends that at least two of three specimens collected within a 3-6 month period be abnormal before considering a patient to be within a diagnostic category.      PHQ2/9: [facility] 09/26/2020 04/11/2020 03/13/2020 02/27/2020 11/15/2019  Decreased Interest 0 2 0 0 0  Down, Depressed, Hopeless 0 0 1 1 0  PHQ - 2 Score 0 2 1 1  0  Altered sleeping - - 0 0 0  Tired, decreased energy - - 1 1 2   Change in appetite - - 0 0 0  Feeling bad or failure about yourself  - - 0 0 0  Trouble concentrating - - 0 0 0  Moving slowly or fidgety/restless - - 0 0 0  Suicidal thoughts - - 0 0 0  PHQ-9 Score - - 2 2 2   Difficult doing work/chores - - Not difficult at all Not difficult at all Not difficult at all  Some recent data might be hidden    phq 9 is negative  Fall Risk: Fall Risk  09/26/2020 04/11/2020 03/13/2020 02/27/2020 11/15/2019  Falls in the past year? 0 0 1 1 1   Number falls in past yr: 0 0 1 1 0  Injury with Fall? 0 0 1 1 1   Risk for fall due to : - - History of fall(s) - -  Follow up - Falls evaluation completed  Falls prevention discussed - -     Functional Status Survey: Is the patient deaf or have difficulty hearing?: No Does the patient have difficulty seeing, even when wearing glasses/contacts?: No Does the patient have difficulty concentrating, remembering, or making decisions?: Yes Does the patient have difficulty walking or climbing stairs?: No Does the patient have difficulty dressing or bathing?: No Does the patient have difficulty doing errands alone such as visiting a doctor's office or shopping?: No    Assessment & Plan  1. Mild cognitive impairment  - Vitamin B12  Explained she needs to engage with people , we will check B12   2. Hypokalemia  - Potassium - Magnesium

## 2020-09-26 ENCOUNTER — Encounter: Payer: Self-pay | Admitting: Family Medicine

## 2020-09-26 ENCOUNTER — Ambulatory Visit (INDEPENDENT_AMBULATORY_CARE_PROVIDER_SITE_OTHER): Payer: Medicare HMO | Admitting: Family Medicine

## 2020-09-26 ENCOUNTER — Other Ambulatory Visit: Payer: Self-pay

## 2020-09-26 VITALS — BP 112/70 | HR 86 | Temp 98.3°F | Resp 16 | Ht 62.0 in | Wt 134.0 lb

## 2020-09-26 DIAGNOSIS — M810 Age-related osteoporosis without current pathological fracture: Secondary | ICD-10-CM | POA: Diagnosis not present

## 2020-09-26 DIAGNOSIS — Z5181 Encounter for therapeutic drug level monitoring: Secondary | ICD-10-CM | POA: Diagnosis not present

## 2020-09-26 DIAGNOSIS — G3184 Mild cognitive impairment, so stated: Secondary | ICD-10-CM | POA: Diagnosis not present

## 2020-09-26 DIAGNOSIS — E876 Hypokalemia: Secondary | ICD-10-CM | POA: Diagnosis not present

## 2020-09-27 LAB — POTASSIUM: Potassium: 3.7 mmol/L (ref 3.5–5.3)

## 2020-09-27 LAB — MAGNESIUM: Magnesium: 2.3 mg/dL (ref 1.5–2.5)

## 2020-09-27 LAB — VITAMIN B12: Vitamin B-12: 744 pg/mL (ref 200–1100)

## 2020-10-07 DIAGNOSIS — M25512 Pain in left shoulder: Secondary | ICD-10-CM | POA: Diagnosis not present

## 2020-10-07 DIAGNOSIS — G8929 Other chronic pain: Secondary | ICD-10-CM | POA: Diagnosis not present

## 2020-10-07 DIAGNOSIS — M25511 Pain in right shoulder: Secondary | ICD-10-CM | POA: Diagnosis not present

## 2020-10-10 ENCOUNTER — Other Ambulatory Visit: Payer: Self-pay | Admitting: Family Medicine

## 2020-10-10 DIAGNOSIS — E1169 Type 2 diabetes mellitus with other specified complication: Secondary | ICD-10-CM

## 2020-10-10 DIAGNOSIS — E119 Type 2 diabetes mellitus without complications: Secondary | ICD-10-CM

## 2020-10-10 DIAGNOSIS — E785 Hyperlipidemia, unspecified: Secondary | ICD-10-CM

## 2020-10-10 MED ORDER — ATORVASTATIN CALCIUM 10 MG PO TABS: 10.0000 mg | ORAL_TABLET | Freq: Every day | ORAL | 1 refills | Status: DC

## 2020-10-10 NOTE — Telephone Encounter (Signed)
Medication: atorvastatin (LIPITOR) 10 MG tablet [[id number]]   Has the patient contacted their pharmacy? YES (Agent: If no, request that the patient contact the pharmacy for the refill.) (Agent: If yes, when and what did the pharmacy advise?)  Preferred Pharmacy (with phone number or street name): [facility] [address], [place] - [address] [place] [address] Phone: [phone] Fax: [phone] Hours: Not open 24 hours    Agent: Please be advised that RX refills may take up to 3 business days. We ask that you follow-up with your pharmacy.

## 2020-10-12 IMAGING — MG DIGITAL SCREENING BILAT W/ TOMO W/ CAD
8 series · 8 of 24 positions shown · non-contrast
Comparison: Previous exam(s).

CLINICAL DATA: Screening.

EXAM:
DIGITAL SCREENING BILATERAL MAMMOGRAM WITH TOMO AND CAD

[R MLO synth-2D]
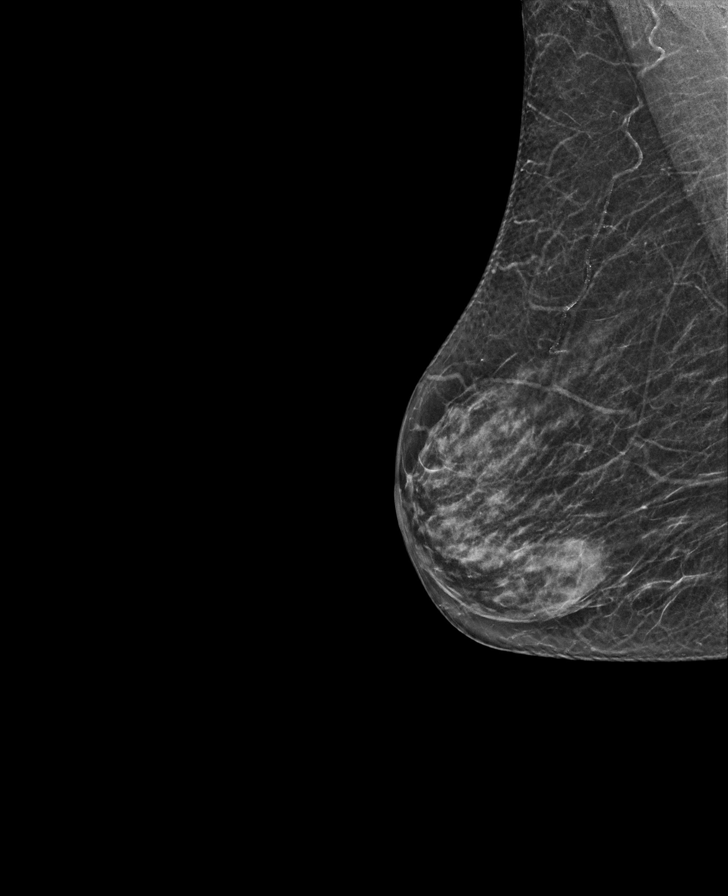

[L MLO synth-2D]
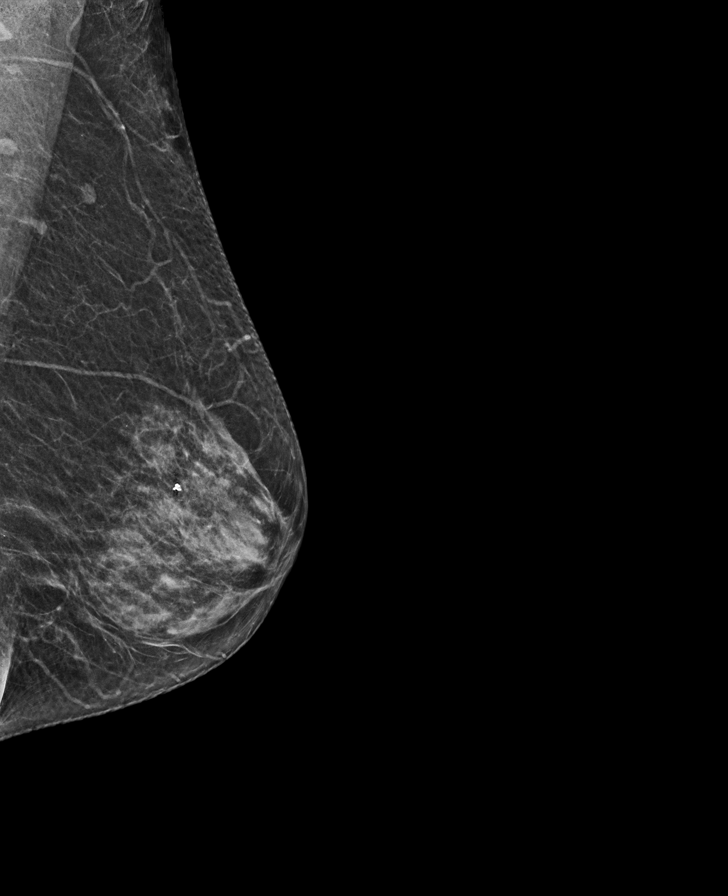

[L CC synth-2D]
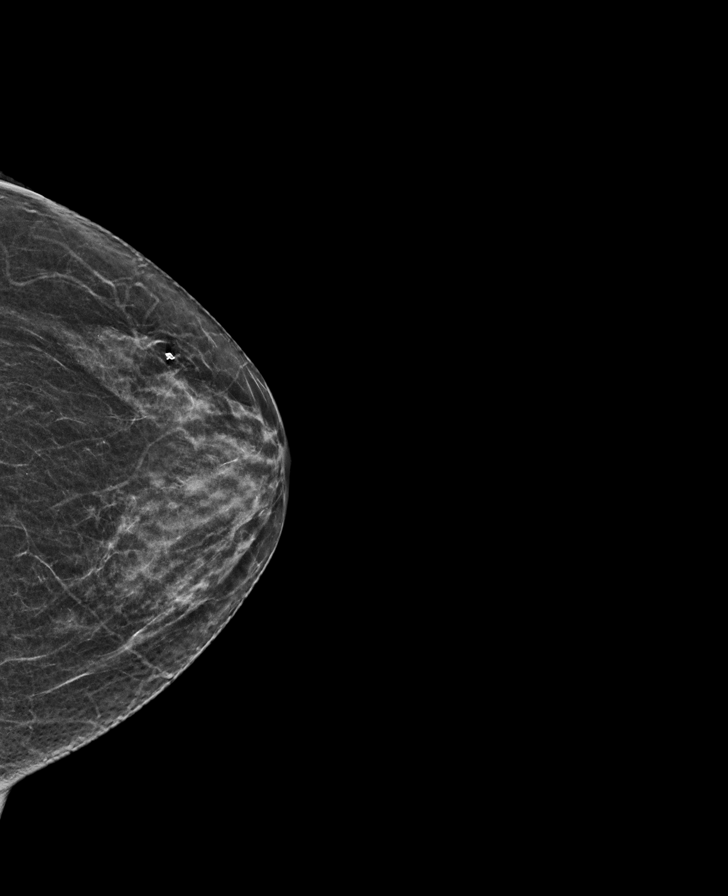

[R CC synth-2D]
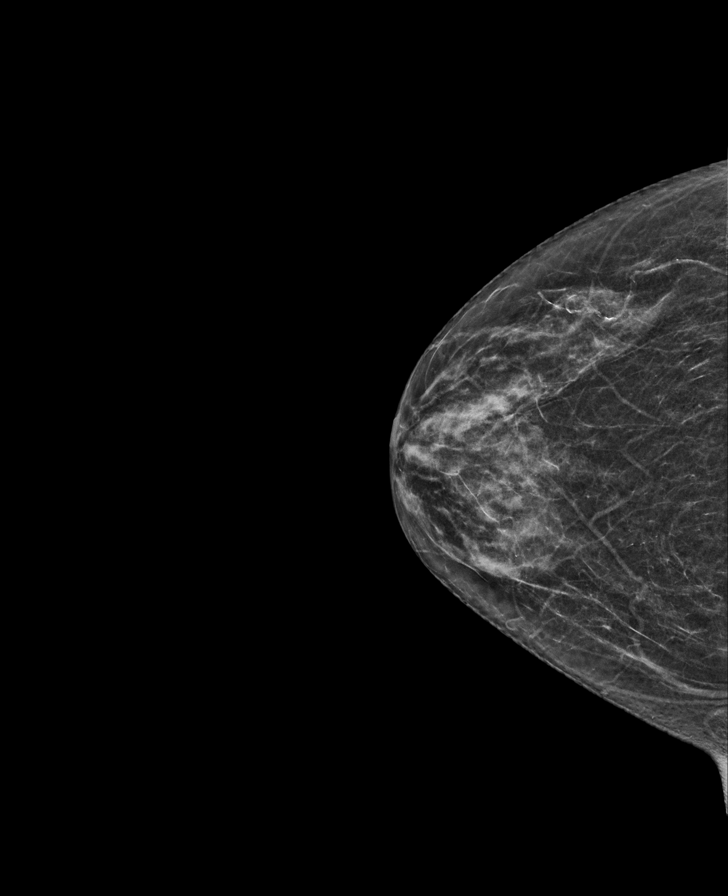

[L CC tomo · tomo slice 25/50.0]
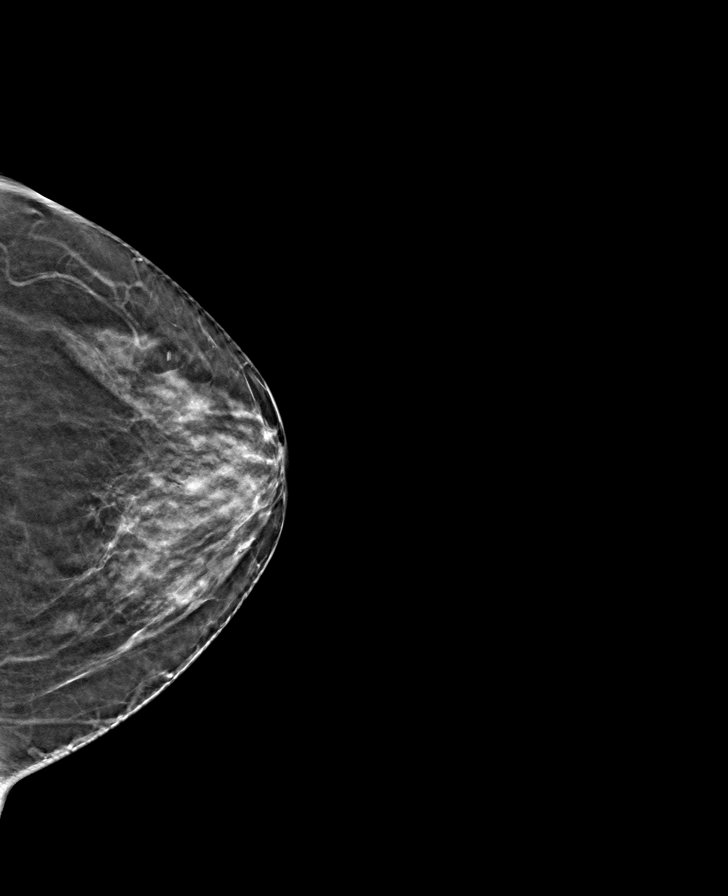

[R MLO tomo · tomo slice 25/48.0]
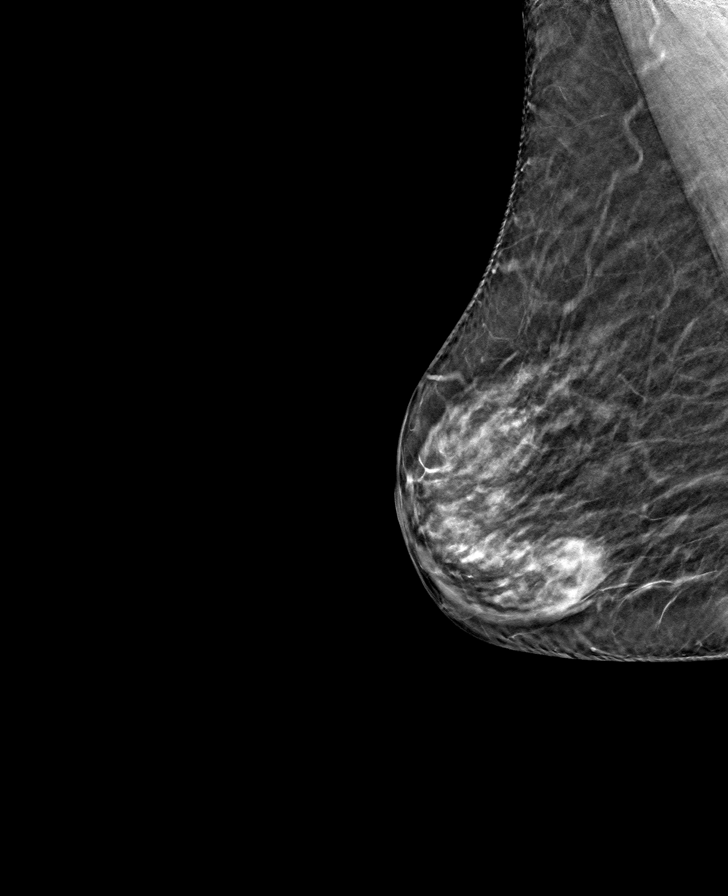

[R CC tomo · tomo slice 26/51.0]
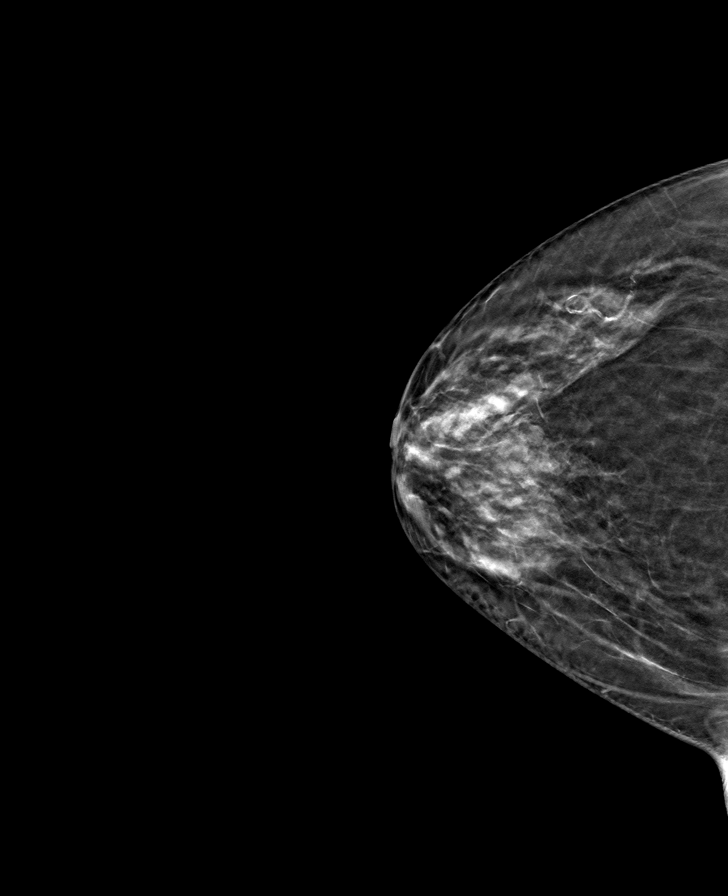

[L MLO tomo · tomo slice 27/53.0]
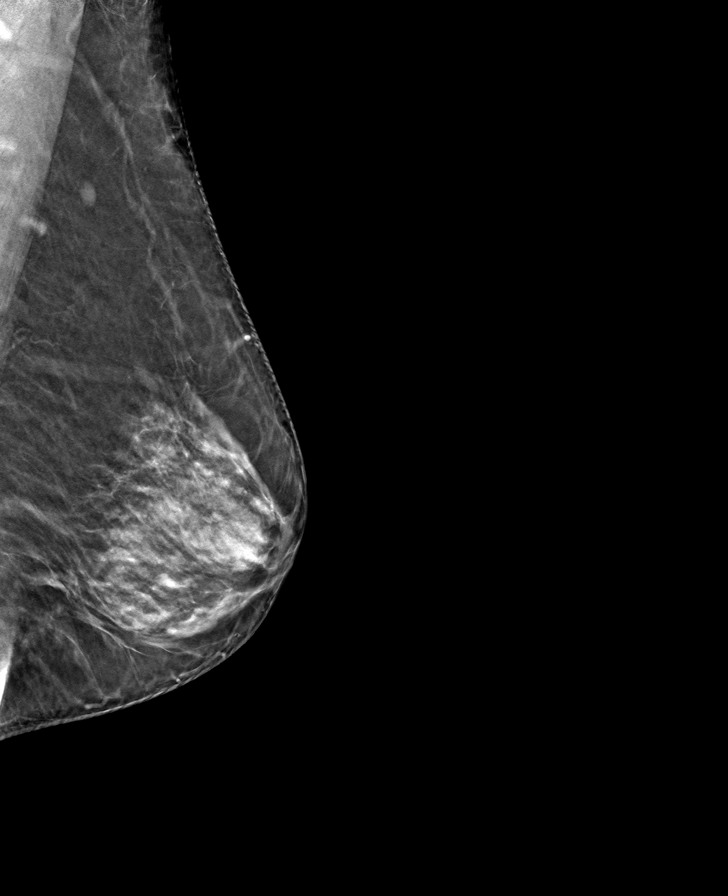

[8 of 24 positions shown; findings below may reference images not displayed]

ACR Breast Density Category c: The breast tissue is heterogeneously
dense, which may obscure small masses.
FINDINGS: There are no findings suspicious for malignancy. Images were
processed with CAD.
IMPRESSION: No mammographic evidence of malignancy. A result letter of this
screening mammogram will be mailed directly to the patient.

RECOMMENDATION:
Screening mammogram in one year. (Code:[id number])

BI-RADS CATEGORY  1: Negative.

## 2020-11-08 ENCOUNTER — Other Ambulatory Visit: Payer: Self-pay | Admitting: Family Medicine

## 2020-11-08 DIAGNOSIS — E785 Hyperlipidemia, unspecified: Secondary | ICD-10-CM

## 2020-11-08 DIAGNOSIS — E119 Type 2 diabetes mellitus without complications: Secondary | ICD-10-CM

## 2020-11-08 DIAGNOSIS — E1169 Type 2 diabetes mellitus with other specified complication: Secondary | ICD-10-CM

## 2021-01-06 ENCOUNTER — Telehealth: Payer: Self-pay

## 2021-01-06 NOTE — Telephone Encounter (Signed)
Copied from CRM [id number]. Topic: General - Other >> Jan 06, 2021  9:00 AM Glean Salen wrote: Reason for CRM: pt calling about discrepancy with medication, forgot to get name of medication, tried to call pt back. She says she only received 17pills and is supposed ot get 90. She also states, the date on the bottle says 06/02 and she is not due till 06/11S he is concerned why that is. Pharmacy:[facility] [address], [place] - [address]

## 2021-01-06 NOTE — Telephone Encounter (Signed)
Spoke to patient and called and left vm with pharmacy about her medication Lipitor

## 2021-01-19 ENCOUNTER — Other Ambulatory Visit: Payer: Self-pay | Admitting: Family Medicine

## 2021-01-19 DIAGNOSIS — Z1231 Encounter for screening mammogram for malignant neoplasm of breast: Secondary | ICD-10-CM

## 2021-02-03 ENCOUNTER — Other Ambulatory Visit: Payer: Self-pay | Admitting: Family Medicine

## 2021-02-03 DIAGNOSIS — E119 Type 2 diabetes mellitus without complications: Secondary | ICD-10-CM

## 2021-02-03 DIAGNOSIS — E1169 Type 2 diabetes mellitus with other specified complication: Secondary | ICD-10-CM

## 2021-02-04 ENCOUNTER — Other Ambulatory Visit: Payer: Self-pay | Admitting: Family Medicine

## 2021-02-04 DIAGNOSIS — E119 Type 2 diabetes mellitus without complications: Secondary | ICD-10-CM

## 2021-02-04 DIAGNOSIS — E1169 Type 2 diabetes mellitus with other specified complication: Secondary | ICD-10-CM

## 2021-02-10 ENCOUNTER — Ambulatory Visit: Payer: Self-pay

## 2021-02-10 NOTE — Telephone Encounter (Signed)
Patient called, Kiara Brady, mailbox is full. This is the 3rd attempt by PEC NT, will route to office for resolution.   Message from Kiara Brady sent at 02/10/2021 12:58 PM EDT  Pt called saying she got stung by a wasp today.  On her finger and she put vinegar on it.  Nothing else is swollen and Kiara problems breathing.  She already had poison ivy broke out on her body.   Please advise  [phone]

## 2021-02-10 NOTE — Telephone Encounter (Signed)
Patient called, no answer, unable to leave a message due to mailbox is full.     Message from Otis Brace sent at 02/10/2021 12:58 PM EDT  Pt called saying she got stung by a wasp today.  On her finger and she put vinegar on it.  Nothing else is swollen and no problems breathing.  She already had poison ivy broke out on her body.   Please advise  [phone]

## 2021-02-10 NOTE — Telephone Encounter (Signed)
2nd attempt to contact patient regarding wasp sting on finger . No answer, unable to leave voicemail due to mailbox full.

## 2021-02-13 ENCOUNTER — Other Ambulatory Visit: Payer: Self-pay | Admitting: Family Medicine

## 2021-02-26 ENCOUNTER — Ambulatory Visit: Payer: Medicare HMO | Admitting: Family Medicine

## 2021-03-03 ENCOUNTER — Other Ambulatory Visit: Payer: Self-pay

## 2021-03-03 ENCOUNTER — Ambulatory Visit (INDEPENDENT_AMBULATORY_CARE_PROVIDER_SITE_OTHER): Payer: Medicare HMO | Admitting: Family Medicine

## 2021-03-03 ENCOUNTER — Encounter: Payer: Self-pay | Admitting: Family Medicine

## 2021-03-03 VITALS — BP 128/70 | HR 68 | Temp 98.0°F | Resp 16 | Ht 62.0 in | Wt 131.0 lb

## 2021-03-03 DIAGNOSIS — Z780 Asymptomatic menopausal state: Secondary | ICD-10-CM

## 2021-03-03 DIAGNOSIS — E785 Hyperlipidemia, unspecified: Secondary | ICD-10-CM

## 2021-03-03 DIAGNOSIS — E876 Hypokalemia: Secondary | ICD-10-CM

## 2021-03-03 DIAGNOSIS — Z5181 Encounter for therapeutic drug level monitoring: Secondary | ICD-10-CM

## 2021-03-03 DIAGNOSIS — E119 Type 2 diabetes mellitus without complications: Secondary | ICD-10-CM

## 2021-03-03 DIAGNOSIS — M542 Cervicalgia: Secondary | ICD-10-CM

## 2021-03-03 DIAGNOSIS — M549 Dorsalgia, unspecified: Secondary | ICD-10-CM | POA: Diagnosis not present

## 2021-03-03 DIAGNOSIS — E1169 Type 2 diabetes mellitus with other specified complication: Secondary | ICD-10-CM

## 2021-03-03 NOTE — Patient Instructions (Signed)
Please call and schedule your bone density follow up - it has been ordered - [phone] See if they can schedule it the same day as your mammogram later this month  [facility] [address] [place],  [place]  [address] Get Driving Directions Main: [phone]    Health Maintenance  Topic Date Due   Zoster (Shingles) Vaccine (1 of 2) Never done   DEXA scan (bone density measurement)  02/01/2020   COVID-19 Vaccine (4 - Booster for Moderna series) 09/22/2020   Hemoglobin A1C  02/26/2021   Flu Shot  03/02/2021   Mammogram  03/17/2021   Eye exam for diabetics  05/15/2021   Colon Cancer Screening  06/03/2021   Complete foot exam   08/29/2021   Urine Protein Check  08/29/2021   Tetanus Vaccine  08/24/2028   Hepatitis C Screening: USPSTF Recommendation to screen - Ages 18-79 yo.  Completed   Pneumonia vaccines  Completed   HPV Vaccine  Aged Out

## 2021-03-03 NOTE — Progress Notes (Signed)
Name: Kiara Brady   MRN: [id number]    DOB: 1946/05/19   Date:03/03/2021       Progress Note  Chief Complaint  Patient presents with   Hyperlipidemia   Diabetes     Subjective:   Kiara Brady is a 75 y.o. female, presents to clinic for routine f/up  Hyperlipidemia: Currently treated with lipitor 10 mg, pt reports good med compliance Last Lipids: Lab Results  Component Value Date   CHOL 173 08/29/2020   HDL 68 08/29/2020   LDLCALC 86 08/29/2020   TRIG 97 08/29/2020   CHOLHDL 2.5 08/29/2020   - Denies: Chest pain, shortness of breath, myalgias, claudication  DM:   Pt managing DM with metformin 500 mg once dialy Reports good med compliance Pt has no SE from meds. Blood sugars not checking Denies: Polyuria, polydipsia, vision changes, neuropathy, hypoglycemia Recent pertinent labs: Lab Results  Component Value Date   HGBA1C 6.7 (H) 08/29/2020   HGBA1C 6.4 (H) 02/27/2020   HGBA1C 6.8 (H) 08/31/2019   Standard of care and health maintenance: Urine Microalbumin:  recheck today Foot exam:  done DM eye exam:  done ACEI/ARB:  not on meds Statin:  yes   Shoulder neck upper back pain,xrays done and referred to spine - no surgery needed, he referred to ortho but pt didn't want to go to another appt She is using OTC cream and it helps with her pain  Due for dexa f/up for osteoporosis and scheduled for mammogram in 2 weeks    Current Outpatient Medications:    atorvastatin (LIPITOR) 10 MG tablet, Take 1 tablet (10 mg total) by mouth at bedtime., Disp: 90 tablet, Rfl: 1   metFORMIN (GLUCOPHAGE) 500 MG tablet, Take 1 tablet by mouth once daily with breakfast, Disp: 90 tablet, Rfl: 0  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   Osteoarthritis of knees, 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   Hyperlipidemia 03/23/2016   Cardiac murmur,  previously undiagnosed 01/23/2016   Heart murmur 09/03/2015   Chronic pain of both knees 06/30/2015   Dyslipidemia associated with type 2 diabetes mellitus (HCC) 02/26/2015   Diabetes mellitus, type 2 (HCC) 01/06/2015    Past Surgical History:  Procedure Laterality Date   VARICOSE VEIN SURGERY      Family History  Problem Relation Age of Onset   Heart disease Mother    Alzheimer's disease Mother    Heart attack Mother    Alcohol abuse Father    Cancer Maternal Grandmother        breast   Breast cancer Maternal Grandmother    Diabetes Sister     Social History   Tobacco Use   Smoking status: Former   Smokeless tobacco: Never  Building services engineer Use: Never used  Substance Use Topics   Alcohol use: No   Drug use: No     Allergies  Allergen Reactions   Oxycodone Other (See Comments)   Penicillin G Hives and Shortness Of Breath   Penicillins    Prednisone Cough   Vitamin D Analogs     Chest pain   Xanax [Alprazolam]     hyperactivity   Fosamax [Alendronate Sodium] Nausea Only    Health Maintenance  Topic Date Due   Zoster Vaccines- Shingrix (1 of 2) Never done   DEXA SCAN  02/01/2020   COVID-19 Vaccine (4 -  Booster for Moderna series) 09/22/2020   HEMOGLOBIN A1C  02/26/2021   INFLUENZA VACCINE  03/02/2021   MAMMOGRAM  03/17/2021   OPHTHALMOLOGY EXAM  05/15/2021   COLONOSCOPY (Pts 45-21yrs Insurance coverage will need to be confirmed)  06/03/2021   FOOT EXAM  08/29/2021   URINE MICROALBUMIN  08/29/2021   TETANUS/TDAP  08/24/2028   Hepatitis C Screening  Completed   PNA vac Low Risk Adult  Completed   HPV VACCINES  Aged Out    Chart Review Today: I personally reviewed active problem list, medication list, allergies, family history, social history, health maintenance, notes from last encounter, lab results, imaging with the patient/caregiver today.   Review of Systems  Constitutional: Negative.   HENT: Negative.    Eyes: Negative.   Respiratory:  Negative.    Cardiovascular: Negative.   Gastrointestinal: Negative.   Endocrine: Negative.   Genitourinary: Negative.   Musculoskeletal: Negative.   Skin: Negative.   Allergic/Immunologic: Negative.   Neurological: Negative.   Hematological: Negative.   Psychiatric/Behavioral: Negative.    All other systems reviewed and are negative.   Objective:   Vitals:   03/03/21 1504  BP: 128/70  Pulse: 68  Resp: 16  Temp: 98 F (36.7 C)  SpO2: 99%  Weight: 131 lb (59.4 kg)  Height: 5\' 2"  (1.575 m)    Body mass index is 23.96 kg/m.  Physical Exam Vitals and nursing note reviewed.  Constitutional:      General: She is not in acute distress.    Appearance: Normal appearance. She is well-developed. She is not ill-appearing, toxic-appearing or diaphoretic.     Interventions: Face mask in place.  HENT:     Head: Normocephalic and atraumatic.     Right Ear: External ear normal.     Left Ear: External ear normal.  Eyes:     General: Lids are normal. No scleral icterus.       Right eye: No discharge.        Left eye: No discharge.     Conjunctiva/sclera: Conjunctivae normal.  Neck:     Trachea: Phonation normal. No tracheal deviation.  Cardiovascular:     Rate and Rhythm: Normal rate and regular rhythm.     Pulses: Normal pulses.          Radial pulses are 2+ on the right side and 2+ on the left side.       Posterior tibial pulses are 2+ on the right side and 2+ on the left side.     Heart sounds: Normal heart sounds. No murmur heard.   No friction rub. No gallop.  Pulmonary:     Effort: Pulmonary effort is normal. No respiratory distress.     Breath sounds: Normal breath sounds. No stridor. No wheezing, rhonchi or rales.  Chest:     Chest wall: No tenderness.  Abdominal:     General: Bowel sounds are normal. There is no distension.     Palpations: Abdomen is soft.  Musculoskeletal:     Right lower leg: No edema.     Left lower leg: No edema.  Skin:    General: Skin is  warm and dry.     Coloration: Skin is not jaundiced or pale.     Findings: No rash.  Neurological:     Mental Status: She is alert.     Motor: No abnormal muscle tone.     Gait: Gait normal.  Psychiatric:        Mood and Affect:  Mood normal.        Speech: Speech normal.        Behavior: Behavior normal.        Assessment & Plan:     ICD-10-CM   1. Type 2 diabetes mellitus without complication, without long-term current use of insulin (HCC)  E11.9 Hemoglobin A1C    COMPLETE METABOLIC PANEL WITH GFR   hx of well controlled dm, recheck labs, on metformin 500 mg once daily, not checking sugars, will recheck microalbumin since not on ACEI/ARB    2. Dyslipidemia associated with type 2 diabetes mellitus (HCC)  E11.69 Hemoglobin A1C   E78.5 COMPLETE METABOLIC PANEL WITH GFR    Microalbumin, urine   on statin, no myalgias, lipids improved and were at goal in feb, montioring    3. Postmenopausal estrogen deficiency  Z78.0 COMPLETE METABOLIC PANEL WITH GFR    DG Bone Density   Due for bone density repeat    4. Hypokalemia  E87.6 COMPLETE METABOLIC PANEL WITH GFR    5. Neck pain  M54.2    Neck and upper shoulder/trapezius pain reviewed her consult with spine specialist which ended up being neurosurgery they referred to Ortho, pt refused    6. Upper back pain  M54.9    discussed ortho vs PT management - she declined right now - encouraged her to f/up when needed, PT would likely be helpful to decrease pain    7. Encounter for medication monitoring  Z51.81 Hemoglobin A1C    COMPLETE METABOLIC PANEL WITH GFR    Microalbumin, urine        Return in about 6 months (around 09/03/2021) for dm/hld in office .   [name], PA-C 03/03/21 3:13 PM

## 2021-03-04 LAB — COMPLETE METABOLIC PANEL WITH GFR
AG Ratio: 1.7 (calc) (ref 1.0–2.5)
ALT: 12 U/L (ref 6–29)
AST: 17 U/L (ref 10–35)
Albumin: 4.4 g/dL (ref 3.6–5.1)
Alkaline phosphatase (APISO): 70 U/L (ref 37–153)
BUN/Creatinine Ratio: 7 (calc) (ref 6–22)
BUN: 6 mg/dL — ABNORMAL LOW (ref 7–25)
CO2: 31 mmol/L (ref 20–32)
Calcium: 9.9 mg/dL (ref 8.6–10.4)
Chloride: 105 mmol/L (ref 98–110)
Creat: 0.88 mg/dL (ref 0.60–1.00)
Globulin: 2.6 g/dL (calc) (ref 1.9–3.7)
Glucose, Bld: 113 mg/dL — ABNORMAL HIGH (ref 65–99)
Potassium: 3.6 mmol/L (ref 3.5–5.3)
Sodium: 143 mmol/L (ref 135–146)
Total Bilirubin: 0.5 mg/dL (ref 0.2–1.2)
Total Protein: 7 g/dL (ref 6.1–8.1)
eGFR: 68 mL/min/{1.73_m2} (ref >=60)

## 2021-03-04 LAB — HEMOGLOBIN A1C
Hgb A1c MFr Bld: 6.6 % of total Hgb — ABNORMAL HIGH (ref <5.7)
Mean Plasma Glucose: 143 mg/dL
eAG (mmol/L): 7.9 mmol/L

## 2021-03-04 LAB — MICROALBUMIN, URINE: Microalb, Ur: 0.4 mg/dL

## 2021-03-05 ENCOUNTER — Telehealth: Payer: Self-pay | Admitting: Family Medicine

## 2021-03-05 NOTE — Telephone Encounter (Signed)
Copied from CRM [id number]. Topic: Medicare AWV >> Mar 05, 2021 12:08 PM Claudette Laws R wrote: Reason for CRM:  Left message for patient to call back and schedule Medicare Annual Wellness Visit (AWV) in office.   If unable to come into the office for AWV,  please offer to do virtually or by telephone.  Last AWV:   03/13/2020  Please schedule at anytime with [facility] Health Advisor.  40 minute appointment  Any questions, please contact me at [phone]

## 2021-03-16 ENCOUNTER — Telehealth: Payer: Self-pay | Admitting: Family Medicine

## 2021-03-16 NOTE — Telephone Encounter (Signed)
Copied from CRM #[id number]. Topic: Medicare AWV >> Mar 16, 2021 10:16 AM Smith, Stephanie R wrote: Reason for CRM:  Left message for patient to call back and schedule Medicare Annual Wellness Visit (AWV) in office.   If unable to come into the office for AWV,  please offer to do virtually or by telephone.  Last AWV: 03/13/2020  Please schedule at anytime with [facility] Health Advisor.  40 minute appointment  Any questions, please contact me at [phone] 

## 2021-03-16 NOTE — Telephone Encounter (Signed)
Copied from CRM [id number]. Topic: Medicare AWV >> Mar 16, 2021 10:16 AM Claudette Laws R wrote: Reason for CRM:  Left message for patient to call back and schedule Medicare Annual Wellness Visit (AWV) in office.   If unable to come into the office for AWV,  please offer to do virtually or by telephone.  Last AWV: 03/13/2020  Please schedule at anytime with [facility] Health Advisor.  40 minute appointment  Any questions, please contact me at [phone]

## 2021-03-18 ENCOUNTER — Ambulatory Visit
Admission: RE | Admit: 2021-03-18 | Discharge: 2021-03-18 | Disposition: A | Discharge status: Home / Self Care | Payer: Medicare HMO | Source: Ambulatory Visit | Attending: Family Medicine | Admitting: Family Medicine

## 2021-03-18 ENCOUNTER — Other Ambulatory Visit: Payer: Self-pay

## 2021-03-18 DIAGNOSIS — Z1231 Encounter for screening mammogram for malignant neoplasm of breast: Secondary | ICD-10-CM | POA: Diagnosis not present

## 2021-03-20 ENCOUNTER — Telehealth: Payer: Self-pay

## 2021-03-20 NOTE — Telephone Encounter (Signed)
Left vm nothing in chart at last visit about arm, pt may need an appointment to discuss

## 2021-03-20 NOTE — Telephone Encounter (Signed)
Copied from CRM [id number]. Topic: General - Other >> Mar 20, 2021 12:09 PM Randol Kern wrote: Reason for CRM: Pt called to report that her PCP has instructed her to have an Xray on her arm. She just had a mammogram with [place], pt is requesting orders for radiology. Says PCP will know (Upper arm Xray)   Best contact: [phone]

## 2021-03-30 ENCOUNTER — Telehealth: Payer: Self-pay

## 2021-03-30 NOTE — Telephone Encounter (Signed)
Left voicemail to inquire about bone density scan. Health maintenance flag follow up.

## 2021-03-31 ENCOUNTER — Telehealth: Payer: Self-pay | Admitting: Family Medicine

## 2021-03-31 NOTE — Telephone Encounter (Signed)
Copied from CRM [id number]. Topic: Medicare AWV >> Mar 31, 2021 11:48 AM Claudette Laws R wrote: Reason for CRM: Left message for patient to call back and schedule Medicare Annual Wellness Visit (AWV) in office.   If unable to come into the office for AWV,  please offer to do virtually or by telephone.  Last AWV: 03/13/2020  Please schedule at anytime with [facility] Health Advisor.  40 minute appointment  Any questions, please contact me at [phone]

## 2021-04-07 ENCOUNTER — Telehealth: Payer: Self-pay

## 2021-04-07 NOTE — Telephone Encounter (Signed)
Copied from CRM [id number]. Topic: General - Other >> Apr 07, 2021 10:15 AM Kiara Brady A wrote: Reason for CRM: Patient called in to inquire of [name] about the X ray she was to have stated that she was told that there were no orders in. Please advise Ph# [phone]

## 2021-04-07 NOTE — Telephone Encounter (Signed)
Cleared out pt questions regards her imaging orders.

## 2021-04-07 NOTE — Telephone Encounter (Signed)
Reached out to pt no answer left a vm to call back to the office.

## 2021-04-07 NOTE — Telephone Encounter (Signed)
Copied from CRM [id number]. Topic: General - Call Back - No Documentation >> Apr 07, 2021  1:11 PM Randol Kern wrote: Reason for CRM: Pt wants to speak to someone in the clinic, she has questions about her "information about my Xray" -very important, personal phone call she says   Best contact: [phone]

## 2021-04-07 NOTE — Telephone Encounter (Signed)
Pt called back in to return Maricela's phone call and asked if she could reach back out to her, please advise.

## 2021-04-07 NOTE — Telephone Encounter (Signed)
Attempted to reach out to pt, no answer.

## 2021-04-07 NOTE — Telephone Encounter (Signed)
Reached out to pt and gave information about her imaging orders.

## 2021-04-13 ENCOUNTER — Telehealth: Payer: Self-pay | Admitting: Family Medicine

## 2021-04-13 NOTE — Telephone Encounter (Signed)
Copied from CRM [id number]. Topic: Medicare AWV >> Apr 13, 2021  1:57 PM Claudette Laws R wrote: Reason for CRM:  Left message for patient to call back and schedule Medicare Annual Wellness Visit (AWV) in office.   If unable to come into the office for AWV,  please offer to do virtually or by telephone.  Last AWV: 03/13/2020  Please schedule at anytime with [facility] Health Advisor.  40 minute appointment  Any questions, please contact me at [phone]

## 2021-04-14 ENCOUNTER — Ambulatory Visit (INDEPENDENT_AMBULATORY_CARE_PROVIDER_SITE_OTHER): Payer: Medicare HMO

## 2021-04-14 ENCOUNTER — Other Ambulatory Visit: Payer: Self-pay

## 2021-04-14 VITALS — BP 132/66 | HR 76 | Temp 98.1°F | Resp 16 | Ht 62.0 in | Wt 130.7 lb

## 2021-04-14 DIAGNOSIS — Z23 Encounter for immunization: Secondary | ICD-10-CM | POA: Diagnosis not present

## 2021-04-14 DIAGNOSIS — Z0000 Encounter for general adult medical examination without abnormal findings: Secondary | ICD-10-CM | POA: Diagnosis not present

## 2021-04-14 NOTE — Progress Notes (Signed)
Subjective:   Kiara Brady is a 75 y.o. female who presents for Medicare Annual (Subsequent) preventive examination.  Review of Systems     Cardiac Risk Factors include: advanced age (>60men, >83 women);diabetes mellitus;dyslipidemia     Objective:    Today's Vitals   04/14/21 0927  BP: 132/66  Pulse: 76  Resp: 16  Temp: 98.1 F (36.7 C)  TempSrc: Oral  SpO2: 97%  Weight: 130 lb 11.2 oz (59.3 kg)  Height: 5\' 2"  (1.575 m)   Body mass index is 23.91 kg/m.  Advanced Directives 04/14/2021 03/13/2020 08/24/2018 08/01/2018 05/25/2017 05/17/2017 04/11/2017  Does Patient Have a Medical Advance Directive? No Yes No No No No No  Type of Advance Directive - Healthcare Power of [place];Living will - - - - -  Copy of Healthcare Power of Attorney in Chart? - No - copy requested - - - - -  Would patient like information on creating a medical advance directive? Yes (MAU/Ambulatory/Procedural Areas - Information given) - - Yes (MAU/Ambulatory/Procedural Areas - Information given) - - -    Current Medications (verified) Outpatient Encounter Medications as of 04/14/2021  Medication Sig   atorvastatin (LIPITOR) 10 MG tablet Take 1 tablet (10 mg total) by mouth at bedtime.   metFORMIN (GLUCOPHAGE) 500 MG tablet Take 1 tablet by mouth once daily with breakfast   No facility-administered encounter medications on file as of 04/14/2021.    Allergies (verified) Oxycodone, Penicillin g, Penicillins, Prednisone, Vitamin d analogs, Xanax [alprazolam], and Fosamax [alendronate sodium]   History: Past Medical History:  Diagnosis Date   Diabetes mellitus without complication (HCC)    Hyperlipidemia    Osteopenia of neck of femur 12/19/2017   Osteoporosis 02/06/2018   July 2019   Polio    [place]   Past Surgical History:  Procedure Laterality Date   VARICOSE VEIN SURGERY     Family History  Problem Relation Age of Onset   Heart disease Mother     Alzheimer's disease Mother    Heart attack Mother    Alcohol abuse Father    Cancer Maternal Grandmother        breast   Breast cancer Maternal Grandmother    Diabetes Sister    Social History   Socioeconomic History   Marital status: Divorced    Spouse name: Not on file   Number of children: 2   Years of education: Not on file   Highest education level: Master's degree (e.g., MA, MS, MEng, MEd, MSW, MBA)  Occupational History   Not on file  Tobacco Use   Smoking status: Former   Smokeless tobacco: Never  Vaping Use   Vaping Use: Never used  Substance and Sexual Activity   Alcohol use: No   Drug use: No   Sexual activity: Not Currently  Other Topics Concern   Not on file  Social History Narrative   Pt lives alone, daughter is in another country and son lives in [place].    Originally from the [place] Strain: Low Risk    Difficulty of Paying Living Expenses: Not hard at all  Food Insecurity: No Food Insecurity   Worried About Corporate investment banker in the Last Year: Never true   Programme researcher, broadcasting/film/video in the Last Year: Never true  Transportation Needs: No Transportation Needs   Lack of Transportation (Medical): No   Lack of Transportation (  Non-Medical): No  Physical Activity: Inactive   Days of Exercise per Week: 0 days   Minutes of Exercise per Session: 0 min  Stress: Stress Concern Present   Feeling of Stress : Rather much  Social Connections: Socially Isolated   Frequency of Communication with Friends and Family: More than three times a week   Frequency of Social Gatherings with Friends and Family: Once a week   Attends Religious Services: Never   Database administrator or Organizations: No   Attends Engineer, structural: Never   Marital Status: Divorced    Tobacco Counseling Counseling given: Not Answered   Clinical Intake:  Pre-visit preparation completed: Yes  Pain : No/denies pain      BMI - recorded: 23.91 Nutritional Status: BMI of 19-24  Normal Nutritional Risks: None Diabetes: Yes CBG done?: No Did pt. bring in CBG monitor from home?: No  How often do you need to have someone help you when you read instructions, pamphlets, or other written materials from your doctor or pharmacy?: 1 - Never  Nutrition Risk Assessment:  Has the patient had any N/V/D within the last 2 months?  No  Does the patient have any non-healing wounds?  No  Has the patient had any unintentional weight loss or weight gain?  No   Diabetes:  Is the patient diabetic?  Yes  If diabetic, was a CBG obtained today?  No  Did the patient bring in their glucometer from home?  No  How often do you monitor your CBG's? Pt does not actively check blood sugar at home. .   Financial Strains and Diabetes Management:  Are you having any financial strains with the device, your supplies or your medication? No .  Does the patient want to be seen by Chronic Care Management for management of their diabetes?  No  Would the patient like to be referred to a Nutritionist or for Diabetic Management?  No   Diabetic Exams:  Diabetic Eye Exam: Completed 05/15/20 negative retinopathy.   Diabetic Foot Exam: Completed 08/29/20.   Interpreter Needed?: No  Information entered by :: Reather Littler LPN   Activities of Daily Living In your present state of health, do you have any difficulty performing the following activities: 04/14/2021 03/03/2021  Hearing? N N  Vision? N N  Difficulty concentrating or making decisions? [name]  Walking or climbing stairs? Y Y  Dressing or bathing? N N  Doing errands, shopping? N N  Preparing Food and eating ? N -  Using the Toilet? N -  In the past six months, have you accidently leaked urine? N -  Do you have problems with loss of bowel control? N -  Managing your Medications? N -  Managing your Finances? N -  Housekeeping or managing your Housekeeping? N -  Some recent data  might be hidden    Patient Care Team: [name], PA-C as PCP - General (Family Medicine)  Indicate any recent Medical Services you may have received from other than [facility] providers in the past year (date may be approximate).     Assessment:   This is a routine wellness examination for [name].  Hearing/Vision screen Hearing Screening - Comments:: Pt has no difficulty hearing Vision Screening - Comments:: [facility] screen[facility]  Dietary issues and exercise activities discussed: Current Exercise Habits: The patient does not participate in regular exercise at present, Exercise limited by: None identified   Goals Addressed   None  Depression Screen PHQ 2/9 Scores 04/14/2021 03/03/2021 09/26/2020 04/11/2020 03/13/2020 02/27/2020 11/15/2019  PHQ - 2 Score 2 1 0 2 1 1  0  PHQ- 9 Score 4 1 - - 2 2 2     Fall Risk Fall Risk  04/14/2021 03/03/2021 09/26/2020 04/11/2020 03/13/2020  Falls in the past year? 1 1 0 0 1  Number falls in past yr: 1 1 0 0 1  Injury with Fall? 1 1 0 0 1  Risk for fall due to : History of fall(s) - - - History of fall(s)  Follow up Falls prevention discussed - - Falls evaluation completed Falls prevention discussed    FALL RISK PREVENTION PERTAINING TO THE HOME:  Any stairs in or around the home? Yes  If so, are there any without handrails? No  Home free of loose throw rugs in walkways, pet beds, electrical cords, etc? Yes  Adequate lighting in your home to reduce risk of falls? Yes   ASSISTIVE DEVICES UTILIZED TO PREVENT FALLS:  Life alert? No  Use of a cane, walker or w/c? No  Grab bars in the bathroom? Yes  Shower chair or bench in shower? Yes  Elevated toilet seat or a handicapped toilet? No   TIMED UP AND GO:  Was the test performed? Yes .  Length of time to ambulate 10 feet: 5 sec.   Gait steady and fast without use of assistive device  Cognitive Function: Cognitive status assessed by direct observation. Patient has  current diagnosis of mild cognitive impairment.    MMSE - Mini Mental State Exam 09/26/2020  Orientation to time 5  Orientation to Place 5  Registration 3  Attention/ Calculation 5  Recall 3  Language- name 2 objects 2  Language- repeat 1  Language- follow 3 step command 3  Language- read & follow direction 1  Write a sentence 1  Copy design 1  Total score 30     6CIT Screen 08/01/2018 01/10/2017  What Year? 0 points 0 points  What month? 0 points 0 points  What time? 0 points 0 points  Count back from 20 0 points 0 points  Months in reverse 0 points 0 points  Repeat phrase 0 points 0 points  Total Score 0 0    Immunizations Immunization History  Administered Date(s) Administered   Fluad Quad(high Dose 65+) 04/14/2021   Influenza, High Dose Seasonal PF 06/09/2018, 04/17/2019   Influenza-Unspecified 05/06/2017, 06/09/2018, 03/02/2020   Moderna Sars-Covid-2 Vaccination 09/14/2019, 10/12/2019, 05/22/2020, 10/29/2020   Pneumococcal Conjugate-13 06/05/2016, 07/01/2017   Pneumococcal Polysaccharide-23 05/10/2019   Tdap 08/24/2018    TDAP status: Up to date  Flu Vaccine status: Completed at today's visit  Pneumococcal vaccine status: Up to date  Covid-19 vaccine status: Completed vaccines  Qualifies for Shingles Vaccine? Yes   Zostavax completed No   Shingrix Completed?: No.    Education has been provided regarding the importance of this vaccine. Patient has been advised to call insurance company to determine out of pocket expense if they have not yet received this vaccine. Advised may also receive vaccine at local pharmacy or Health Dept. Verbalized acceptance and understanding.  Screening Tests Health Maintenance  Topic Date Due   Zoster Vaccines- Shingrix (1 of 2) Never done   DEXA SCAN  02/01/2020   OPHTHALMOLOGY EXAM  05/15/2021   FOOT EXAM  08/29/2021   HEMOGLOBIN A1C  09/03/2021   URINE MICROALBUMIN  03/03/2022   MAMMOGRAM  03/18/2022   TETANUS/TDAP   08/24/2028   INFLUENZA  VACCINE  Completed   COVID-19 Vaccine  Completed   Hepatitis C Screening  Completed   PNA vac Low Risk Adult  Completed   HPV VACCINES  Aged Out    Health Maintenance  Health Maintenance Due  Topic Date Due   Zoster Vaccines- Shingrix (1 of 2) Never done   DEXA SCAN  02/01/2020    Colorectal cancer screening: No longer required.   Mammogram status: Completed 03/18/21. Repeat every year  Bone Density status: Completed 01/31/18. Results reflect: Bone density results: OSTEOPOROSIS. Repeat every 2 years. Scheduled for 04/27/21  Lung Cancer Screening: (Low Dose CT Chest recommended if Age 44-80 years, 30 pack-year currently smoking OR have quit w/in 15years.) does not qualify.  Additional Screening:  Hepatitis C Screening: does qualify; Completed 02/28/19  Vision Screening: Recommended annual ophthalmology exams for early detection of glaucoma and other disorders of the eye. Is the patient up to date with their annual eye exam?  Yes  Who is the provider or what is the name of the office in which the patient attends annual eye exams? [facility].   Dental Screening: Recommended annual dental exams for proper oral hygiene  Community Resource Referral / Chronic Care Management: CRR required this visit?  No   CCM required this visit?  No      Plan:     I have personally reviewed and noted the following in the patient's chart:   Medical and social history Use of alcohol, tobacco or illicit drugs  Current medications and supplements including opioid prescriptions.  Functional ability and status Nutritional status Physical activity Advanced directives List of other physicians Hospitalizations, surgeries, and ER visits in previous 12 months Vitals Screenings to include cognitive, depression, and falls Referrals and appointments  In addition, I have reviewed and discussed with patient certain preventive protocols, quality metrics, and best  practice recommendations. A written personalized care plan for preventive services as well as general preventive health recommendations were provided to patient.     Reather Littler, LPN   [id number]   Nurse Notes: none

## 2021-04-14 NOTE — Patient Instructions (Signed)
Kiara Brady , Thank you for taking time to come for your Medicare Wellness Visit. I appreciate your ongoing commitment to your health goals. Please review the following plan we discussed and let me know if I can assist you in the future.   Screening recommendations/referrals: Colonoscopy: done 2012; no longer required Mammogram: done 03/18/21 Bone Density: done 01/31/18; scheduled for 04/27/21 Recommended yearly ophthalmology/optometry visit for glaucoma screening and checkup Recommended yearly dental visit for hygiene and checkup  Vaccinations: Influenza vaccine: done today Pneumococcal vaccine: done 05/10/19 Tdap vaccine: done 08/24/18 Shingles vaccine: Shingrix discussed. Please contact your pharmacy for coverage information.  Covid-19: done 09/14/19, 10/12/19, 05/22/20 & 10/29/20  Advanced directives: Advance directive discussed with you today. I have provided a copy for you to complete at home and have notarized. Once this is complete please bring a copy in to our office so we can scan it into your chart.   Conditions/risks identified: Keep up the great work!  Next appointment: Follow up in one year for your annual wellness visit    Preventive Care 65 Years and Older, Female Preventive care refers to lifestyle choices and visits with your health care provider that can promote health and wellness. What does preventive care include? A yearly physical exam. This is also called an annual well check. Dental exams once or twice a year. Routine eye exams. Ask your health care provider how often you should have your eyes checked. Personal lifestyle choices, including: Daily care of your teeth and gums. Regular physical activity. Eating a healthy diet. Avoiding tobacco and drug use. Limiting alcohol use. Practicing safe sex. Taking low-dose aspirin every day. Taking vitamin and mineral supplements as recommended by your health care provider. What happens during an annual well check? The  services and screenings done by your health care provider during your annual well check will depend on your age, overall health, lifestyle risk factors, and family history of disease. Counseling  Your health care provider may ask you questions about your: Alcohol use. Tobacco use. Drug use. Emotional well-being. Home and relationship well-being. Sexual activity. Eating habits. History of falls. Memory and ability to understand (cognition). Work and work Astronomer. Reproductive health. Screening  You may have the following tests or measurements: Height, weight, and BMI. Blood pressure. Lipid and cholesterol levels. These may be checked every 5 years, or more frequently if you are over 104 years old. Skin check. Lung cancer screening. You may have this screening every year starting at age 69 if you have a 30-pack-year history of smoking and currently smoke or have quit within the past 15 years. Fecal occult blood test (FOBT) of the stool. You may have this test every year starting at age 86. Flexible sigmoidoscopy or colonoscopy. You may have a sigmoidoscopy every 5 years or a colonoscopy every 10 years starting at age 75. Hepatitis C blood test. Hepatitis B blood test. Sexually transmitted disease (STD) testing. Diabetes screening. This is done by checking your blood sugar (glucose) after you have not eaten for a while (fasting). You may have this done every 1-3 years. Bone density scan. This is done to screen for osteoporosis. You may have this done starting at age 60. Mammogram. This may be done every 1-2 years. Talk to your health care provider about how often you should have regular mammograms. Talk with your health care provider about your test results, treatment options, and if necessary, the need for more tests. Vaccines  Your health care provider may recommend certain vaccines, such  as: Influenza vaccine. This is recommended every year. Tetanus, diphtheria, and acellular  pertussis (Tdap, Td) vaccine. You may need a Td booster every 10 years. Zoster vaccine. You may need this after age 8. Pneumococcal 13-valent conjugate (PCV13) vaccine. One dose is recommended after age 90. Pneumococcal polysaccharide (PPSV23) vaccine. One dose is recommended after age 64. Talk to your health care provider about which screenings and vaccines you need and how often you need them. This information is not intended to replace advice given to you by your health care provider. Make sure you discuss any questions you have with your health care provider. Document Released: 08/15/2015 Document Revised: 04/07/2016 Document Reviewed: 05/20/2015 Elsevier Interactive Patient Education  2017 [place] Prevention in the Home Falls can cause injuries. They can happen to people of all ages. There are many things you can do to make your home safe and to help prevent falls. What can I do on the outside of my home? Regularly fix the edges of walkways and driveways and fix any cracks. Remove anything that might make you trip as you walk through a door, such as a raised step or threshold. Trim any bushes or trees on the path to your home. Use bright outdoor lighting. Clear any walking paths of anything that might make someone trip, such as rocks or tools. Regularly check to see if handrails are loose or broken. Make sure that both sides of any steps have handrails. Any raised decks and porches should have guardrails on the edges. Have any leaves, snow, or ice cleared regularly. Use sand or salt on walking paths during winter. Clean up any spills in your garage right away. This includes oil or grease spills. What can I do in the bathroom? Use night lights. Install grab bars by the toilet and in the tub and shower. Do not use towel bars as grab bars. Use non-skid mats or decals in the tub or shower. If you need to sit down in the shower, use a plastic, non-slip stool. Keep the floor  dry. Clean up any water that spills on the floor as soon as it happens. Remove soap buildup in the tub or shower regularly. Attach bath mats securely with double-sided non-slip rug tape. Do not have throw rugs and other things on the floor that can make you trip. What can I do in the bedroom? Use night lights. Make sure that you have a light by your bed that is easy to reach. Do not use any sheets or blankets that are too big for your bed. They should not hang down onto the floor. Have a firm chair that has side arms. You can use this for support while you get dressed. Do not have throw rugs and other things on the floor that can make you trip. What can I do in the kitchen? Clean up any spills right away. Avoid walking on wet floors. Keep items that you use a lot in easy-to-reach places. If you need to reach something above you, use a strong step stool that has a grab bar. Keep electrical cords out of the way. Do not use floor polish or wax that makes floors slippery. If you must use wax, use non-skid floor wax. Do not have throw rugs and other things on the floor that can make you trip. What can I do with my stairs? Do not leave any items on the stairs. Make sure that there are handrails on both sides of the stairs and use  them. Fix handrails that are broken or loose. Make sure that handrails are as long as the stairways. Check any carpeting to make sure that it is firmly attached to the stairs. Fix any carpet that is loose or worn. Avoid having throw rugs at the top or bottom of the stairs. If you do have throw rugs, attach them to the floor with carpet tape. Make sure that you have a light switch at the top of the stairs and the bottom of the stairs. If you do not have them, ask someone to add them for you. What else can I do to help prevent falls? Wear shoes that: Do not have high heels. Have rubber bottoms. Are comfortable and fit you well. Are closed at the toe. Do not wear  sandals. If you use a stepladder: Make sure that it is fully opened. Do not climb a closed stepladder. Make sure that both sides of the stepladder are locked into place. Ask someone to hold it for you, if possible. Clearly mark and make sure that you can see: Any grab bars or handrails. First and last steps. Where the edge of each step is. Use tools that help you move around (mobility aids) if they are needed. These include: Canes. Walkers. Scooters. Crutches. Turn on the lights when you go into a dark area. Replace any light bulbs as soon as they burn out. Set up your furniture so you have a clear path. Avoid moving your furniture around. If any of your floors are uneven, fix them. If there are any pets around you, be aware of where they are. Review your medicines with your doctor. Some medicines can make you feel dizzy. This can increase your chance of falling. Ask your doctor what other things that you can do to help prevent falls. This information is not intended to replace advice given to you by your health care provider. Make sure you discuss any questions you have with your health care provider. Document Released: 05/15/2009 Document Revised: 12/25/2015 Document Reviewed: 08/23/2014 Elsevier Interactive Patient Education  2017 [employer].

## 2021-04-23 ENCOUNTER — Other Ambulatory Visit: Payer: Self-pay | Admitting: Family Medicine

## 2021-04-23 DIAGNOSIS — E1169 Type 2 diabetes mellitus with other specified complication: Secondary | ICD-10-CM

## 2021-04-23 DIAGNOSIS — E119 Type 2 diabetes mellitus without complications: Secondary | ICD-10-CM

## 2021-04-27 ENCOUNTER — Other Ambulatory Visit: Payer: Self-pay

## 2021-04-27 ENCOUNTER — Ambulatory Visit
Admission: RE | Admit: 2021-04-27 | Discharge: 2021-04-27 | Disposition: A | Discharge status: Home / Self Care | Payer: Medicare HMO | Source: Ambulatory Visit | Attending: Family Medicine | Admitting: Family Medicine

## 2021-04-27 DIAGNOSIS — Z780 Asymptomatic menopausal state: Secondary | ICD-10-CM | POA: Diagnosis not present

## 2021-04-27 DIAGNOSIS — M810 Age-related osteoporosis without current pathological fracture: Secondary | ICD-10-CM | POA: Diagnosis not present

## 2021-04-27 DIAGNOSIS — M85832 Other specified disorders of bone density and structure, left forearm: Secondary | ICD-10-CM | POA: Diagnosis not present

## 2021-05-06 ENCOUNTER — Other Ambulatory Visit: Payer: Self-pay

## 2021-05-06 DIAGNOSIS — M810 Age-related osteoporosis without current pathological fracture: Secondary | ICD-10-CM

## 2021-05-15 ENCOUNTER — Telehealth: Payer: Self-pay

## 2021-05-15 NOTE — Telephone Encounter (Signed)
Lvm asking pt to return call to schedul appt. Pt does have option of seeing Raynelle Fanning or [facility]

## 2021-05-15 NOTE — Telephone Encounter (Signed)
Pt needs appt

## 2021-05-15 NOTE — Telephone Encounter (Signed)
Copied from CRM [id number]. Topic: General - Inquiry >> May 14, 2021  2:54 PM Daphine Deutscher D wrote: Reason for CRM: Pt called saying she thinks she needs spine health and a MRI of her spine.   CB#   [phone] or my chart her

## 2021-05-18 ENCOUNTER — Encounter: Payer: Self-pay | Admitting: Nurse Practitioner

## 2021-05-18 ENCOUNTER — Other Ambulatory Visit: Payer: Self-pay

## 2021-05-18 ENCOUNTER — Ambulatory Visit (INDEPENDENT_AMBULATORY_CARE_PROVIDER_SITE_OTHER): Payer: Medicare HMO | Admitting: Nurse Practitioner

## 2021-05-18 VITALS — BP 122/78 | HR 72 | Temp 98.3°F | Resp 14 | Ht 62.0 in | Wt 131.8 lb

## 2021-05-18 DIAGNOSIS — M5030 Other cervical disc degeneration, unspecified cervical region: Secondary | ICD-10-CM | POA: Diagnosis not present

## 2021-05-18 DIAGNOSIS — M419 Scoliosis, unspecified: Secondary | ICD-10-CM | POA: Diagnosis not present

## 2021-05-18 DIAGNOSIS — M542 Cervicalgia: Secondary | ICD-10-CM | POA: Diagnosis not present

## 2021-05-18 DIAGNOSIS — G8929 Other chronic pain: Secondary | ICD-10-CM

## 2021-05-18 DIAGNOSIS — M25562 Pain in left knee: Secondary | ICD-10-CM

## 2021-05-18 DIAGNOSIS — M25561 Pain in right knee: Secondary | ICD-10-CM | POA: Diagnosis not present

## 2021-05-18 DIAGNOSIS — M549 Dorsalgia, unspecified: Secondary | ICD-10-CM | POA: Diagnosis not present

## 2021-05-18 MED ORDER — METHOCARBAMOL 500 MG PO TABS: 500.0000 mg | ORAL_TABLET | Freq: Two times a day (BID) | ORAL | 0 refills | Status: DC | PRN

## 2021-05-18 NOTE — Progress Notes (Signed)
BP 122/78   Pulse 72   Temp 98.3 F (36.8 C) (Oral)   Resp 14   Ht _0  (1.575 m)   Wt 131 lb 12.8 oz (59.8 kg)   SpO2 97%   BMI 24.11 kg/m    Subjective:    Patient ID: Kiara Brady, female    DOB: 1946/05/17, 75 y.o.   MRN: [id number]  HPI: Kiara Brady is a 75 y.o. female, here alone  Chief Complaint  Patient presents with   Neck Pain   Neck and upper back pain: Pain started several years ago. She describes the pain as tightness, sharp and achy. Pain is a range of 5-10/10.  She says the pain is worse when she is laying down.  Was seen by Delsa Grana PA regarding this 08/29/2020.  She had xrays done and was told to see orthopedic doctor.  Xrays showed she had DDD in the cervical spine and scoliosis of the thoracic spine. She declined at the time to see an orthopedic.  No new trauma.  She denies any numbness or tingling down her arms.  She says she has been rolling a towel and placing it behind her neck it helps.  She has also used icy hot and that has helped. Referral placed for ortho and will give prescription for robaxin.   Osteoarthritis of knees bilateral:  She says that she was seen at [facility] and was told she would need knee surgery but she says she is not going to do that. She also will not go back there again.   Relevant past medical, surgical, family and social history reviewed and updated as indicated. Interim medical history since our last visit reviewed. Allergies and medications reviewed and updated.  Review of Systems  Constitutional: Negative for fever or weight change.  Respiratory: Negative for cough and shortness of breath.   Cardiovascular: Negative for chest pain or palpitations.  Gastrointestinal: Negative for abdominal pain, no bowel changes.  Musculoskeletal: Negative for gait problem or joint swelling. Positive for neck and upper back pain Skin: Negative for rash.  Neurological: Negative for dizziness or headache.  No other specific  complaints in a complete review of systems (except as listed in HPI above).      Objective:    BP 122/78   Pulse 72   Temp 98.3 F (36.8 C) (Oral)   Resp 14   Ht _1  (1.575 m)   Wt 131 lb 12.8 oz (59.8 kg)   SpO2 97%   BMI 24.11 kg/m   Wt Readings from Last 3 Encounters:  05/18/21 131 lb 12.8 oz (59.8 kg)  04/14/21 130 lb 11.2 oz (59.3 kg)  03/03/21 131 lb (59.4 kg)    Physical Exam  Constitutional: Patient appears well-developed and well-nourished. No distress.  HEENT: head atraumatic, normocephalic, pupils equal and reactive to light, neck supple Cardiovascular: Normal rate, regular rhythm and normal heart sounds.  Murmur heard. No BLE edema. Pulmonary/Chest: Effort normal and breath sounds normal. No respiratory distress. Abdominal: Soft.  There is no tenderness. Musculoskeletal: noted tightness of the trapezius muscle with tenderness noted Psychiatric: Patient has a normal mood and affect. behavior is normal. Judgment and thought content normal.   Results for orders placed or performed in visit on 03/03/21  Hemoglobin A1C  Result Value Ref Range   Hgb A1c MFr Bld 6.6 (H) <5.7 % of total Hgb   Mean Plasma Glucose 143 mg/dL   eAG (mmol/L) 7.9 mmol/L  COMPLETE METABOLIC PANEL  WITH GFR  Result Value Ref Range   Glucose, Bld 113 (H) 65 - 99 mg/dL   BUN 6 (L) 7 - 25 mg/dL   Creat 0.88 0.60 - 1.00 mg/dL   eGFR 68 > OR = 60 mL/min/1.72m   BUN/Creatinine Ratio 7 6 - 22 (calc)   Sodium 143 135 - 146 mmol/L   Potassium 3.6 3.5 - 5.3 mmol/L   Chloride 105 98 - 110 mmol/L   CO2 31 20 - 32 mmol/L   Calcium 9.9 8.6 - 10.4 mg/dL   Total Protein 7.0 6.1 - 8.1 g/dL   Albumin 4.4 3.6 - 5.1 g/dL   Globulin 2.6 1.9 - 3.7 g/dL (calc)   AG Ratio 1.7 1.0 - 2.5 (calc)   Total Bilirubin 0.5 0.2 - 1.2 mg/dL   Alkaline phosphatase (APISO) 70 37 - 153 U/L   AST 17 10 - 35 U/L   ALT 12 6 - 29 U/L  Microalbumin, urine  Result Value Ref Range   Microalb, Ur 0.4 mg/dL   RAM         Assessment & Plan:   1. Neck pain/DDD (degenerative disc disease), cervical  - methocarbamol (ROBAXIN) 500 MG tablet; Take 1 tablet (500 mg total) by mouth 2 (two) times daily as needed for muscle spasms.  Dispense: 20 tablet; Refill: 0 - Ambulatory referral to Orthopedic Surgery  2.  Upper back pain/Scoliosis of thoracic spine, unspecified scoliosis type  - methocarbamol (ROBAXIN) 500 MG tablet; Take 1 tablet (500 mg total) by mouth 2 (two) times daily as needed for muscle spasms.  Dispense: 20 tablet; Refill: 0 - Ambulatory referral to Orthopedic Surgery  4. Chronic pain of both knees  Follow up plan:  With orthopedic

## 2021-05-25 ENCOUNTER — Telehealth: Payer: Self-pay

## 2021-05-25 NOTE — Telephone Encounter (Signed)
Copied from CRM [id number]. Topic: General - Other >> May 25, 2021  3:13 PM Pawlus, Maxine Glenn A wrote: Reason for CRM: Outpatient imaging needed clarification on an order for [facility] Thoracic Spine 4V, please advise.

## 2021-05-26 ENCOUNTER — Telehealth: Payer: Self-pay | Admitting: Emergency Medicine

## 2021-05-26 NOTE — Telephone Encounter (Signed)
Patient call regarding her xray at hospital for thoracic spine xrays. Patient was informed that she had these xrays back in January and was informed to see Neurosurgery. I informed her a referral for [facility] was put in and once I called them they stated she did come in on March 8 at 1 and saw Dr.Cook. They spoke to patient about surgery and her options. Patient stated to them she did not want surgery and they referred her to Dr.Kabinski and she stated she was not interested at that time and will call later. Patient and I also discussed her Bone Density and it was explained to her she had a referral to Endocrinology for Osteoporosis. I called Endocrinology at [facility] and they stated they had called the patient 3 x's an d sent a letter out on October 19. Patient was given number [phone] to call for appointment. She verbalized understanding

## 2021-07-22 ENCOUNTER — Other Ambulatory Visit: Payer: Self-pay | Admitting: Family Medicine

## 2021-07-22 DIAGNOSIS — E119 Type 2 diabetes mellitus without complications: Secondary | ICD-10-CM

## 2021-07-22 DIAGNOSIS — E785 Hyperlipidemia, unspecified: Secondary | ICD-10-CM

## 2021-07-22 NOTE — Telephone Encounter (Signed)
Requested Prescriptions  °Pending Prescriptions Disp Refills  °• metFORMIN (GLUCOPHAGE) 500 MG tablet [Pharmacy Med Name: metFORMIN HCl 500 MG Oral Tablet] 90 tablet 0  °  Sig: Take 1 tablet by mouth once daily with breakfast  °  ° Endocrinology:  Diabetes - Biguanides Passed - 07/22/2021  4:18 AM  °  °  Passed - Cr in normal range and within 360 days  °  Creat  °Date Value Ref Range Status  °03/03/2021 0.88 0.60 - 1.00 mg/dL Final  ° °Creatinine, Urine  °Date Value Ref Range Status  °02/27/2020 61 20 - 275 mg/dL Final  °   °  °  Passed - HBA1C is between 0 and 7.9 and within 180 days  °  Hgb A1c MFr Bld  °Date Value Ref Range Status  °03/03/2021 6.6 (H) <5.7 % of total Hgb Final  °  Comment:  °  For someone without known diabetes, a hemoglobin A1c °value of 6.5% or greater indicates that they may have  °diabetes and this should be confirmed with a follow-up  °test. °. °For someone with known diabetes, a value <7% indicates  °that their diabetes is well controlled and a value  °greater than or equal to 7% indicates suboptimal  °control. A1c targets should be individualized based on  °duration of diabetes, age, comorbid conditions, and  °other considerations. °. °Currently, no consensus exists regarding use of °hemoglobin A1c for diagnosis of diabetes for children. °. °  °   °  °  Passed - eGFR in normal range and within 360 days  °  GFR, Est African American  °Date Value Ref Range Status  °08/29/2020 78 > OR = 60 mL/min/1.73m2 Final  ° °GFR, Est Non African American  °Date Value Ref Range Status  °08/29/2020 67 > OR = 60 mL/min/1.73m2 Final  ° °eGFR  °Date Value Ref Range Status  °03/03/2021 68 > OR = 60 mL/min/1.73m2 Final  °  Comment:  °  The eGFR is based on the CKD-EPI 2021 equation. To calculate  °the new eGFR from a previous Creatinine or Cystatin C °result, go to https://www.kidney.org/professionals/ °kdoqi/gfr%5Fcalculator °  °   °  °  Passed - Valid encounter within last 6 months  °  Recent Outpatient  Visits   °      ° 2 months ago Neck pain  ° [facility] Pender, Julie F, FNP  ° 4 months ago Type 2 diabetes mellitus without complication, without long-term current use of insulin (HCC)  ° [facility] Tapia, Leisa, PA-C  ° 9 months ago Mild cognitive impairment  ° [facility] Sowles, Krichna, MD  ° 10 months ago Type 2 diabetes mellitus without complication, without long-term current use of insulin (HCC)  ° [facility] Tapia, Leisa, PA-C  ° 1 year ago Acute cystitis with hematuria  ° [facility] Tapia, Leisa, PA-C  °  °  °Future Appointments   °        ° In 1 month Tapia, Leisa, PA-C [facility]  ° In 8 months  [facility]  °  ° °  °  °  °• atorvastatin (LIPITOR) 10 MG tablet [Pharmacy Med Name: Atorvastatin Calcium 10 MG Oral Tablet] 90 tablet 0  °  Sig: TAKE 1 TABLET BY MOUTH AT BEDTIME  °  ° Cardiovascular:  Antilipid - Statins Passed - 07/22/2021  4:18 AM  °  °    Passed - Total Cholesterol in normal range and within 360 days  °  Cholesterol, Total  °Date Value Ref Range Status  °09/08/2015 166 100 - 199 mg/dL Final  ° °Cholesterol  °Date Value Ref Range Status  °08/29/2020 173 <200 mg/dL Final  °   °  °  Passed - LDL in normal range and within 360 days  °  LDL Cholesterol (Calc)  °Date Value Ref Range Status  °08/29/2020 86 mg/dL (calc) Final  °  Comment:  °  Reference range: <100 °. °Desirable range <100 mg/dL for primary prevention;   °<70 mg/dL for patients with CHD or diabetic patients  °with > or = 2 CHD risk factors. °. °LDL-C is now calculated using the Martin-Hopkins  °calculation, which is a validated novel method providing  °better accuracy than the Friedewald equation in the  °estimation of LDL-C.  °Martin SS et al. JAMA. 2013;310(19): 2061-2068  °(http://education.[facility].com/faq/FAQ164) °  °   °  °  Passed - HDL in normal range and within 360  days  °  HDL  °Date Value Ref Range Status  °08/29/2020 68 > OR = 50 mg/dL Final  °09/08/2015 68 >39 mg/dL Final  °   °  °  Passed - Triglycerides in normal range and within 360 days  °  Triglycerides  °Date Value Ref Range Status  °08/29/2020 97 <150 mg/dL Final  °   °  °  Passed - Patient is not pregnant  °  °  Passed - Valid encounter within last 12 months  °  Recent Outpatient Visits   °      ° 2 months ago Neck pain  ° [facility] Pender, Julie F, FNP  ° 4 months ago Type 2 diabetes mellitus without complication, without long-term current use of insulin (HCC)  ° [facility] Tapia, Leisa, PA-C  ° 9 months ago Mild cognitive impairment  ° [facility] Sowles, Krichna, MD  ° 10 months ago Type 2 diabetes mellitus without complication, without long-term current use of insulin (HCC)  ° [facility] Tapia, Leisa, PA-C  ° 1 year ago Acute cystitis with hematuria  ° [facility] Tapia, Leisa, PA-C  °  °  °Future Appointments   °        ° In 1 month Tapia, Leisa, PA-C [facility]  ° In 8 months  [facility]  °  ° °  °  °  ° °

## 2021-07-30 ENCOUNTER — Ambulatory Visit (INDEPENDENT_AMBULATORY_CARE_PROVIDER_SITE_OTHER): Payer: Medicare HMO | Admitting: Internal Medicine

## 2021-07-30 ENCOUNTER — Encounter: Payer: Self-pay | Admitting: Internal Medicine

## 2021-07-30 VITALS — BP 152/80 | HR 83 | Temp 98.6°F | Resp 18 | Wt 133.6 lb

## 2021-07-30 DIAGNOSIS — J029 Acute pharyngitis, unspecified: Secondary | ICD-10-CM

## 2021-07-30 DIAGNOSIS — K219 Gastro-esophageal reflux disease without esophagitis: Secondary | ICD-10-CM

## 2021-07-30 DIAGNOSIS — R079 Chest pain, unspecified: Secondary | ICD-10-CM | POA: Diagnosis not present

## 2021-07-30 MED ORDER — PANTOPRAZOLE SODIUM 40 MG PO TBEC: 40.0000 mg | DELAYED_RELEASE_TABLET | Freq: Every day | ORAL | 3 refills | Status: DC

## 2021-07-30 NOTE — Progress Notes (Signed)
Acute Office Visit  Subjective:    Patient ID: Kiara Brady, female    DOB: [birth year], 75 y.o.   MRN: [id number]  Chief Complaint  Patient presents with   itchy throat   Chest Pain    HPI Patient is in today for scratchy throat but also has been having some anterior chest wall pain on and off for the last week.  URI Compliant:  -Worst symptom: sore throat -Fever: no -Cough: yes producing a thick white mucus -Shortness of breath: no -Wheezing: no -Chest pain: yes, see more below -Chest tightness: no -Chest congestion: no -Nasal congestion: no -Runny nose: no -Post nasal drip: yes -Sore throat: yes -Sinus pressure: no -Treatments attempted: saline rinses  CHEST PAIN Time since onset: Duration:1 weeks Onset: sudden Quality: aching and burning Severity: mild Location: substernal Radiation: none Episode duration:  Frequency: occasional Related to exertion: no Activity when pain started: no Trauma: no Anxiety/recent stressors: no Aggravating factors: laying down at night, chocolate Alleviating factors: nothing Status: stable Treatments attempted: nothing  Current pain status: pain free Shortness of breath: no Cough: yes, productive Nausea: no Diaphoresis: no Heartburn: yes Palpitations: no  Past Medical History:  Diagnosis Date   Diabetes mellitus without complication ([place])    Hyperlipidemia    Osteopenia of neck of femur 12/19/2017   Osteoporosis 02/06/2018   July 2019   Polio    [place] of [place] a child [place]    Past Surgical History:  Procedure Laterality Date   VARICOSE VEIN SURGERY      Family History  Problem Relation Age of Onset   Heart disease Mother    Alzheimer's disease Mother    Heart attack Mother    Alcohol abuse Father    Cancer Maternal Grandmother        breast   Breast cancer Maternal Grandmother    Diabetes Sister     Social History   Socioeconomic History   Marital status: Divorced    Spouse name: Not  on file   Number of children: 2   Years of education: Not on file   Highest education level: Master's degree (e.g., MA, MS, MEng, MEd, MSW, MBA)  Occupational History   Not on file  Tobacco Use   Smoking status: Former   Smokeless tobacco: Never  Vaping Use   Vaping Use: Never used  Substance and Sexual Activity   Alcohol use: No   Drug use: No   Sexual activity: Not Currently  Other Topics Concern   Not on file  Social History Narrative   Pt lives alone, daughter is in another country and son lives in [place].    Originally from the [place] Strain: Low Risk    Difficulty of Paying Living Expenses: Not hard at all  Food Insecurity: No Food Insecurity   Worried About Charity fundraiser in the Last Year: Never true   Arboriculturist in the Last Year: Never true  Transportation Needs: No Transportation Needs   Lack of Transportation (Medical): No   Lack of Transportation (Non-Medical): No  Physical Activity: Inactive   Days of Exercise per Week: 0 days   Minutes of Exercise per Session: 0 min  Stress: Stress Concern Present   Feeling of Stress : Rather much  Social Connections: Socially Isolated   Frequency of Communication with Friends and Family: More than three times a week   Frequency of  Social Gatherings with Friends and Family: Once a week   Attends Religious Services: Never   Marine scientist or Organizations: No   Attends Music therapist: Never   Marital Status: Divorced  Human resources officer Violence: Not At Risk   Fear of Current or Ex-Partner: No   Emotionally Abused: No   Physically Abused: No   Sexually Abused: No    Outpatient Medications Prior to Visit  Medication Sig Dispense Refill   atorvastatin (LIPITOR) 10 MG tablet TAKE 1 TABLET BY MOUTH AT BEDTIME 90 tablet 0   metFORMIN (GLUCOPHAGE) 500 MG tablet Take 1 tablet by mouth once daily with breakfast 90 tablet 0    methocarbamol (ROBAXIN) 500 MG tablet Take 1 tablet (500 mg total) by mouth 2 (two) times daily as needed for muscle spasms. 20 tablet 0   No facility-administered medications prior to visit.    Allergies  Allergen Reactions   Oxycodone Other (See Comments)   Penicillin G Hives and Shortness Of Breath   Penicillins    Prednisone Cough   Vitamin D Analogs     Chest pain   Xanax [Alprazolam]     hyperactivity   Fosamax [Alendronate Sodium] Nausea Only    Review of Systems  Constitutional:  Negative for chills and fever.  HENT:  Positive for postnasal drip and sore throat. Negative for congestion, ear discharge, ear pain, rhinorrhea, sinus pressure and sinus pain.   Respiratory:  Positive for cough. Negative for shortness of breath and wheezing.   Cardiovascular:  Positive for chest pain. Negative for palpitations and leg swelling.  Gastrointestinal:  Negative for abdominal pain and vomiting.  Neurological:  Negative for dizziness and headaches.      Objective:    Physical Exam Constitutional:      Appearance: She is well-developed.  HENT:     Head: Normocephalic and atraumatic.     Nose: Nose normal.     Mouth/Throat:     Mouth: Mucous membranes are moist.     Pharynx: Oropharynx is clear.     Comments: Mild PND present Eyes:     Conjunctiva/sclera: Conjunctivae normal.  Cardiovascular:     Rate and Rhythm: Normal rate and regular rhythm.     Comments: Pain in anterior chest wall with palpation Pulmonary:     Effort: Pulmonary effort is normal.     Breath sounds: Normal breath sounds.  Musculoskeletal:     Right lower leg: No edema.     Left lower leg: No edema.  Skin:    General: Skin is warm and dry.  Neurological:     General: No focal deficit present.     Mental Status: She is alert. Mental status is at baseline.  Psychiatric:        Mood and Affect: Mood normal.        Behavior: Behavior normal.    There were no vitals taken for this visit. Wt Readings  from Last 3 Encounters:  05/18/21 131 lb 12.8 oz (59.8 kg)  04/14/21 130 lb 11.2 oz (59.3 kg)  03/03/21 131 lb (59.4 kg)    Health Maintenance Due  Topic Date Due   Zoster Vaccines- Shingrix (1 of 2) Never done   COVID-19 Vaccine (5 - Booster for Moderna series) 12/24/2020   OPHTHALMOLOGY EXAM  05/15/2021    There are no preventive care reminders to display for this patient.   No results found for: TSH Lab Results  Component Value Date   WBC 9.0  08/29/2020   HGB 12.6 08/29/2020   HCT 37.6 08/29/2020   MCV 92.8 08/29/2020   PLT 367 08/29/2020   Lab Results  Component Value Date   NA 143 03/03/2021   K 3.6 03/03/2021   CO2 31 03/03/2021   GLUCOSE 113 (H) 03/03/2021   BUN 6 (L) 03/03/2021   CREATININE 0.88 03/03/2021   BILITOT 0.5 03/03/2021   ALKPHOS 60 09/28/2016   AST 17 03/03/2021   ALT 12 03/03/2021   PROT 7.0 03/03/2021   ALBUMIN 3.9 09/28/2016   CALCIUM 9.9 03/03/2021   EGFR 68 03/03/2021   Lab Results  Component Value Date   CHOL 173 08/29/2020   Lab Results  Component Value Date   HDL 68 08/29/2020   Lab Results  Component Value Date   LDLCALC 86 08/29/2020   Lab Results  Component Value Date   TRIG 97 08/29/2020   Lab Results  Component Value Date   CHOLHDL 2.5 08/29/2020   Lab Results  Component Value Date   HGBA1C 6.6 (H) 03/03/2021       Assessment & Plan:   1. Chest pain, unspecified type/Gastroesophageal reflux disease, unspecified whether esophagitis present: Non-exertional chest pain, appears to be more related to acid reflux and sinus drainage. EKG in the office normal sinus rhythm without ST changes, no previous EKGs to compare. Will treat with daily PPI and lifestyle change and recheck in 1 month. Discussed if chest pain worsens or does not resolve with PPI to present to the emergency room for MI rule out.   - EKG 12-Lead - pantoprazole (PROTONIX) 40 MG tablet; Take 1 tablet (40 mg total) by mouth daily.  Dispense: 30 tablet;  Refill: 3  2. Sore throat: Mild post nasal drip present, rule out COVID with swab today. She is allergic to steroids so recommend using a nasal saline for relief, can consider anti-histamine at follow up.   - Novel Coronavirus, NAA ([facility])  Follow up: 1 month  Teodora Medici, DO

## 2021-07-30 NOTE — Patient Instructions (Addendum)
It was great seeing you today!  Plan discussed at today's visit: -Take acid pill daily, decrease chocolate, acidic and spicy foods especially before bed -Can take anti-inflammatories for chest wall pain but if chest pain comes back and does not resolved in a few minutes, please go to the emergency room  -COVID swab today -Recommend using a nasal saline to help with throat and congestion  Follow up in: 1 month   Take care and let us know if you have any questions or concerns prior to your next visit.  Dr. Caralee Ates  Gastroesophageal Reflux Disease, Adult Gastroesophageal reflux (GER) happens when acid from the stomach flows up into the tube that connects the mouth and the stomach (esophagus). Normally, food travels down the esophagus and stays in the stomach to be digested. With GER, food and stomach acid sometimes move back up into the esophagus. You may have a disease called gastroesophageal reflux disease (GERD) if the reflux: Happens often. Causes frequent or very bad symptoms. Causes problems such as damage to the esophagus. When this happens, the esophagus becomes sore and swollen. Over time, GERD can make small holes (ulcers) in the lining of the esophagus. What are the causes? This condition is caused by a problem with the muscle between the esophagus and the stomach. When this muscle is weak or not normal, it does not close properly to keep food and acid from coming back up from the stomach. The muscle can be weak because of: Tobacco use. Pregnancy. Having a certain type of hernia (hiatal hernia). Alcohol use. Certain foods and drinks, such as coffee, chocolate, onions, and peppermint. What increases the risk? Being overweight. Having a disease that affects your connective tissue. Taking NSAIDs, such a ibuprofen. What are the signs or symptoms? Heartburn. Difficult or painful swallowing. The feeling of having a lump in the throat. A bitter taste in the mouth. Bad  breath. Having a lot of saliva. Having an upset or bloated stomach. Burping. Chest pain. Different conditions can cause chest pain. Make sure you see your doctor if you have chest pain. Shortness of breath or wheezing. A long-term cough or a cough at night. Wearing away of the surface of teeth (tooth enamel). Weight loss. How is this treated? Making changes to your diet. Taking medicine. Having surgery. Treatment will depend on how bad your symptoms are. Follow these instructions at home: Eating and drinking  Follow a diet as told by your doctor. You may need to avoid foods and drinks such as: Coffee and tea, with or without caffeine. Drinks that contain alcohol. Energy drinks and sports drinks. Bubbly (carbonated) drinks or sodas. Chocolate and cocoa. Peppermint and mint flavorings. Garlic and onions. Horseradish. Spicy and acidic foods. These include peppers, chili powder, curry powder, vinegar, hot sauces, and BBQ sauce. Citrus fruit juices and citrus fruits, such as oranges, lemons, and limes. Tomato-based foods. These include red sauce, chili, salsa, and pizza with red sauce. Fried and fatty foods. These include donuts, french fries, potato chips, and high-fat dressings. High-fat meats. These include hot dogs, rib eye steak, sausage, ham, and bacon. High-fat dairy items, such as whole milk, butter, and cream cheese. Eat small meals often. Avoid eating large meals. Avoid drinking large amounts of liquid with your meals. Avoid eating meals during the 2-3 hours before bedtime. Avoid lying down right after you eat. Do not exercise right after you eat. Lifestyle  Do not smoke or use any products that contain nicotine or tobacco. If you need help  quitting, ask your doctor. Try to lower your stress. If you need help doing this, ask your doctor. If you are overweight, lose an amount of weight that is healthy for you. Ask your doctor about a safe weight loss goal. General  instructions Pay attention to any changes in your symptoms. Take over-the-counter and prescription medicines only as told by your doctor. Do not take aspirin, ibuprofen, or other NSAIDs unless your doctor says it is okay. Wear loose clothes. Do not wear anything tight around your waist. Raise (elevate) the head of your bed about 6 inches (15 cm). You may need to use a wedge to do this. Avoid bending over if this makes your symptoms worse. Keep all follow-up visits. Contact a doctor if: You have new symptoms. You lose weight and you do not know why. You have trouble swallowing or it hurts to swallow. You have wheezing or a cough that keeps happening. You have a hoarse voice. Your symptoms do not get better with treatment. Get help right away if: You have sudden pain in your arms, neck, jaw, teeth, or back. You suddenly feel sweaty, dizzy, or light-headed. You have chest pain or shortness of breath. You vomit and the vomit is green, yellow, or black, or it looks like blood or coffee grounds. You faint. Your poop (stool) is red, bloody, or black. You cannot swallow, drink, or eat. These symptoms may represent a serious problem that is an emergency. Do not wait to see if the symptoms will go away. Get medical help right away. Call your local emergency services (911 in the [place] Do not drive yourself to the hospital. Summary If a person has gastroesophageal reflux disease (GERD), food and stomach acid move back up into the esophagus and cause symptoms or problems such as damage to the esophagus. Treatment will depend on how bad your symptoms are. Follow a diet as told by your doctor. Take all medicines only as told by your doctor. This information is not intended to replace advice given to you by your health care provider. Make sure you discuss any questions you have with your health care provider. Document Revised: 01/28/2020 Document Reviewed: 01/28/2020 Elsevier Patient Education  2022  [employer].

## 2021-07-31 LAB — NOVEL CORONAVIRUS, NAA: SARS-CoV-2, NAA: NOT DETECTED

## 2021-07-31 LAB — SARS-COV-2, NAA 2 DAY TAT

## 2021-07-31 LAB — SPECIMEN STATUS REPORT

## 2021-08-27 ENCOUNTER — Other Ambulatory Visit: Payer: Self-pay

## 2021-08-27 DIAGNOSIS — M810 Age-related osteoporosis without current pathological fracture: Secondary | ICD-10-CM

## 2021-09-03 ENCOUNTER — Ambulatory Visit: Payer: Medicare HMO | Admitting: Internal Medicine

## 2021-09-07 ENCOUNTER — Encounter: Payer: Self-pay | Admitting: Internal Medicine

## 2021-09-07 ENCOUNTER — Ambulatory Visit (INDEPENDENT_AMBULATORY_CARE_PROVIDER_SITE_OTHER): Payer: Medicare HMO | Admitting: Internal Medicine

## 2021-09-07 VITALS — BP 126/80 | HR 82 | Temp 98.2°F | Resp 16 | Ht 62.0 in | Wt 129.6 lb

## 2021-09-07 DIAGNOSIS — K219 Gastro-esophageal reflux disease without esophagitis: Secondary | ICD-10-CM

## 2021-09-07 DIAGNOSIS — M810 Age-related osteoporosis without current pathological fracture: Secondary | ICD-10-CM

## 2021-09-07 DIAGNOSIS — E1165 Type 2 diabetes mellitus with hyperglycemia: Secondary | ICD-10-CM | POA: Diagnosis not present

## 2021-09-07 DIAGNOSIS — E785 Hyperlipidemia, unspecified: Secondary | ICD-10-CM

## 2021-09-07 DIAGNOSIS — E1169 Type 2 diabetes mellitus with other specified complication: Secondary | ICD-10-CM | POA: Diagnosis not present

## 2021-09-07 DIAGNOSIS — R102 Pelvic and perineal pain: Secondary | ICD-10-CM | POA: Diagnosis not present

## 2021-09-07 LAB — POCT URINALYSIS DIPSTICK
Bilirubin, UA: NEGATIVE
Blood, UA: NEGATIVE
Glucose, UA: NEGATIVE
Ketones, UA: NEGATIVE
Leukocytes, UA: NEGATIVE
Nitrite, UA: NEGATIVE
Odor: NORMAL
Protein, UA: NEGATIVE
Spec Grav, UA: 1.02 (ref 1.010–1.025)
Urobilinogen, UA: 0.2 E.U./dL
pH, UA: 6 (ref 5.0–8.0)

## 2021-09-07 MED ORDER — VITAMIN D (CHOLECALCIFEROL) 25 MCG (1000 UT) PO TABS: 1.0000 | ORAL_TABLET | Freq: Every day | ORAL | 5 refills | Status: DC

## 2021-09-07 MED ORDER — CALCIUM CITRATE 250 MG PO TABS: 250.0000 mg | ORAL_TABLET | Freq: Every day | ORAL | 5 refills | Status: DC

## 2021-09-07 NOTE — Patient Instructions (Addendum)
It was great seeing you today!  Plan discussed at today's visit: -Blood work ordered today, results will be uploaded to [place].  -Calcium and Vitamin D sent to pharmacy  -Urine tested today   Follow up in: 6 months   Take care and let us know if you have any questions or concerns prior to your next visit.  Dr. Rosana Berger   Osteoporosis Osteoporosis happens when the bones become thin and less dense than normal. Osteoporosis makes bones more brittle and fragile and more likely to break (fracture). Over time, osteoporosis can cause your bones to become so weak that they fracture after a minor fall. Bones in the hip, wrist, and spine are most likely to fracture due to osteoporosis. What are the causes? The exact cause of this condition is not known. What increases the risk? You are more likely to develop this condition if you: Have family members with this condition. Have poor nutrition. Use the following: Steroid medicines, such as prednisone. Anti-seizure medicines. Nicotine or tobacco, such as cigarettes, e-cigarettes, and chewing tobacco. Are female. Are age 68 or older. Are not physically active (are sedentary). Are of European or Asian descent. Have a small body frame. What are the signs or symptoms? A fracture might be the first sign of osteoporosis, especially if the fracture results from a fall or injury that usually would not cause a bone to break. Other signs and symptoms include: Pain in the neck or low back. Stooped posture. Loss of height. How is this diagnosed? This condition may be diagnosed based on: Your medical history. A physical exam. A bone mineral density test, also called a DXA or DEXA test (dual-energy X-ray absorptiometry test). This test uses X-rays to measure the amount of minerals in your bones. How is this treated? This condition may be treated by: Making lifestyle changes, such as: Including foods with more calcium and vitamin D in your  diet. Doing weight-bearing and muscle-strengthening exercises. Stopping tobacco use. Limiting alcohol intake. Taking medicine to slow the process of bone loss or to increase bone density. Taking daily supplements of calcium and vitamin D. Taking hormone replacement medicines, such as estrogen for women and testosterone for men. Monitoring your levels of calcium and vitamin D. The goal of treatment is to strengthen your bones and lower your risk for a fracture. Follow these instructions at home: Eating and drinking Include calcium and vitamin D in your diet. Calcium is important for bone health, and vitamin D helps your body absorb calcium. Good sources of calcium and vitamin D include: Certain fatty fish, such as salmon and tuna. Products that have calcium and vitamin D added to them (are fortified), such as fortified cereals. Egg yolks. Cheese. Liver.  Activity Do exercises as told by your health care provider. Ask your health care provider what exercises and activities are safe for you. You should do: Exercises that make you work against gravity (weight-bearing exercises), such as tai chi, yoga, or walking. Exercises to strengthen muscles, such as lifting weights. Lifestyle Do not drink alcohol if: Your health care provider tells you not to drink. You are pregnant, may be pregnant, or are planning to become pregnant. If you drink alcohol: Limit how much you use to: 0-1 drink a day for women. 0-2 drinks a day for men. Know how much alcohol is in your drink. In the [place] one drink equals one 12 oz bottle of beer (355 mL), one 5 oz glass of wine (148 mL), or one 1 oz glass  of hard liquor (44 mL). Do not use any products that contain nicotine or tobacco, such as cigarettes, e-cigarettes, and chewing tobacco. If you need help quitting, ask your health care provider. Preventing falls Use devices to help you move around (mobility aids) as needed, such as canes, walkers, scooters, or  crutches. Keep rooms well-lit and clutter-free. Remove tripping hazards from walkways, including cords and throw rugs. Install grab bars in bathrooms and safety rails on stairs. Use rubber mats in the bathroom and other areas that are often wet or slippery. Wear closed-toe shoes that fit well and support your feet. Wear shoes that have rubber soles or low heels. Review your medicines with your health care provider. Some medicines can cause dizziness or changes in blood pressure, which can increase your risk of falling. General instructions Take over-the-counter and prescription medicines only as told by your health care provider. Keep all follow-up visits. This is important. Contact a health care provider if: You have never been screened for osteoporosis and you are: A woman who is age 104 or older. A man who is age 78 or older. Get help right away if: You fall or injure yourself. Summary Osteoporosis is thinning and loss of density in your bones. This makes bones more brittle and fragile and more likely to break (fracture),even with minor falls. The goal of treatment is to strengthen your bones and lower your risk for a fracture. Include calcium and vitamin D in your diet. Calcium is important for bone health, and vitamin D helps your body absorb calcium. Talk with your health care provider about screening for osteoporosis if you are a woman who is age 8 or older, or a man who is age 57 or older. This information is not intended to replace advice given to you by your health care provider. Make sure you discuss any questions you have with your health care provider. Document Revised: 01/03/2020 Document Reviewed: 01/03/2020 Elsevier Patient Education  [place].

## 2021-09-07 NOTE — Progress Notes (Signed)
Established Patient Office Visit  Subjective:  Patient ID: Kiara Brady, female    DOB: 1946-04-29  Age: 76 y.o. MRN: [id number]  CC:  Chief Complaint  Patient presents with   Follow-up    4 weeks   Gastroesophageal Reflux    Protonix is not taking b/c did not like the way it made her feel.    HPI Kiara Brady presents for follow up on chest pain thought to be acid reflux. She was started on a PPI. Today she states she stopped taking the Protonix because it made her feel short of breath. She does only have symptoms of chest pain and epigastric burning when she eats chocolate. She's tried cut down on this and is doing better.   URINARY SYMPTOMS Dysuria: no Urinary frequency: yes Urgency: yes Small volume voids: yes Urinary incontinence: yes Hematuria: yes Abdominal pain: no Suprapubic pain/pressure: yes Flank pain: no Fever:  no Vomiting: no  Diabetes, Type 2: -Last A1c 6.6% 8/22 -Medications: Metformin 500  -Patient is compliant with the above medications and reports no side effects.  -Checking BG at home: No -Eye exam: August 2022 -Foot exam: due today -Microalbumin: up to date -Statin: yes -PNA vaccine: yes -Denies symptoms of hypoglycemia, polyuria, polydipsia, numbness extremities, foot ulcers/trauma.   HLD: -Medications: Lipitor 10 -Patient is compliant with above medications and reports no side effects.  -Last lipid panel: 1/22 Lipid Panel     Component Value Date/Time   CHOL 173 08/29/2020 1442   CHOL 166 09/08/2015 1053   TRIG 97 08/29/2020 1442   HDL 68 08/29/2020 1442   HDL 68 09/08/2015 1053   CHOLHDL 2.5 08/29/2020 1442   VLDL 15 09/28/2016 1015   LDLCALC 86 08/29/2020 1442   LABVLDL 25 09/08/2015 1053   The 10-year ASCVD risk score (Arnett DK, et al., 2019) is: 30.9%   Values used to calculate the score:     Age: 4 years     Sex: Female     Is Non-Hispanic African American: No     Diabetic: Yes     Tobacco smoker: No     Systolic  Blood Pressure: 126 mmHg     Is BP treated: No     HDL Cholesterol: 68 mg/dL     Total Cholesterol: 173 mg/dL  Health Maintenance: -Blood work up to date -Mammogram 8/22 Birads 1 -DEXA 8/22 showing t score -4.6 of lumbar spine - allergy to bisphosphates, allergy to Vitamin D in the past (states the pills were too large for the 50,000 IU) and is lactose intolerant and doesn't get a lot calcium in diet  Past Medical History:  Diagnosis Date   Diabetes mellitus without complication ([place])    Hyperlipidemia    Osteopenia of neck of femur 12/19/2017   Osteoporosis 02/06/2018   July 2019   Polio    [place] a [place]    Past Surgical History:  Procedure Laterality Date   VARICOSE VEIN SURGERY      Family History  Problem Relation Age of Onset   Heart disease Mother    Alzheimer's disease Mother    Heart attack Mother    Alcohol abuse Father    Cancer Maternal Grandmother        breast   Breast cancer Maternal Grandmother    Diabetes Sister     Social History   Socioeconomic History   Marital status: Divorced    Spouse name: Not on file  Number of children: 2   Years of education: Not on file   Highest education level: Master's degree (e.g., MA, MS, MEng, MEd, MSW, MBA)  Occupational History   Not on file  Tobacco Use   Smoking status: Former   Smokeless tobacco: Never  Vaping Use   Vaping Use: Never used  Substance and Sexual Activity   Alcohol use: No   Drug use: No   Sexual activity: Not Currently  Other Topics Concern   Not on file  Social History Narrative   Pt lives alone, daughter is in another country and son lives in [place].    Originally from the [place] Strain: Low Risk    Difficulty of Paying Living Expenses: Not hard at all  Food Insecurity: No Food Insecurity   Worried About Charity fundraiser in the Last Year: Never true   Arboriculturist in the Last Year:  Never true  Transportation Needs: No Transportation Needs   Lack of Transportation (Medical): No   Lack of Transportation (Non-Medical): No  Physical Activity: Inactive   Days of Exercise per Week: 0 days   Minutes of Exercise per Session: 0 min  Stress: Stress Concern Present   Feeling of Stress : Rather much  Social Connections: Socially Isolated   Frequency of Communication with Friends and Family: More than three times a week   Frequency of Social Gatherings with Friends and Family: Once a week   Attends Religious Services: Never   Marine scientist or Organizations: No   Attends Music therapist: Never   Marital Status: Divorced  Human resources officer Violence: Not At Risk   Fear of Current or Ex-Partner: No   Emotionally Abused: No   Physically Abused: No   Sexually Abused: No    Outpatient Medications Prior to Visit  Medication Sig Dispense Refill   atorvastatin (LIPITOR) 10 MG tablet TAKE 1 TABLET BY MOUTH AT BEDTIME 90 tablet 0   metFORMIN (GLUCOPHAGE) 500 MG tablet Take 1 tablet by mouth once daily with breakfast 90 tablet 0   pantoprazole (PROTONIX) 40 MG tablet Take 1 tablet (40 mg total) by mouth daily. 30 tablet 3   No facility-administered medications prior to visit.    Allergies  Allergen Reactions   Oxycodone Other (See Comments)   Penicillin G Hives and Shortness Of Breath   Penicillins    Prednisone Cough   Vitamin D Analogs     Chest pain   Xanax [Alprazolam]     hyperactivity   Fosamax [Alendronate Sodium] Nausea Only    ROS Review of Systems  Constitutional:  Negative for chills and fever.  Respiratory:  Negative for cough and shortness of breath.   Cardiovascular:  Negative for chest pain and palpitations.  Gastrointestinal:  Negative for abdominal pain, nausea and vomiting.  Genitourinary:  Positive for frequency and urgency. Negative for dysuria and hematuria.     Objective:    Physical Exam Constitutional:       Appearance: Normal appearance.  HENT:     Head: Normocephalic and atraumatic.  Eyes:     Conjunctiva/sclera: Conjunctivae normal.  Cardiovascular:     Rate and Rhythm: Normal rate and regular rhythm.     Pulses:          Dorsalis pedis pulses are 2+ on the right side and 2+ on the left side.  Pulmonary:     Effort: Pulmonary effort is  normal.     Breath sounds: Normal breath sounds.  Musculoskeletal:     Right lower leg: No edema.     Left lower leg: No edema.     Right foot: Normal range of motion. No deformity, bunion, Charcot foot, foot drop or prominent metatarsal heads.     Left foot: Normal range of motion. No deformity, bunion, Charcot foot, foot drop or prominent metatarsal heads.  Feet:     Right foot:     Protective Sensation: 6 sites tested.  6 sites sensed.     Skin integrity: Skin integrity normal.     Toenail Condition: Right toenails are normal.     Left foot:     Protective Sensation: 6 sites tested.  6 sites sensed.     Skin integrity: Skin integrity normal.     Toenail Condition: Left toenails are normal.  Skin:    General: Skin is warm and dry.  Neurological:     General: No focal deficit present.     Mental Status: She is alert. Mental status is at baseline.  Psychiatric:        Mood and Affect: Mood normal.        Behavior: Behavior normal.    BP 126/80    Pulse 82    Temp 98.2 F (36.8 C)    Resp 16    Ht $R'5\' 2"'pP$  (4.128 m)    Wt 129 lb 9.6 oz (58.8 kg)    SpO2 95%    BMI 23.70 kg/m  Wt Readings from Last 3 Encounters:  07/30/21 133 lb 9.6 oz (60.6 kg)  05/18/21 131 lb 12.8 oz (59.8 kg)  04/14/21 130 lb 11.2 oz (59.3 kg)     Health Maintenance Due  Topic Date Due   Zoster Vaccines- Shingrix (1 of 2) Never done   COVID-19 Vaccine (5 - Booster for Moderna series) 12/24/2020   OPHTHALMOLOGY EXAM  05/15/2021   FOOT EXAM  08/29/2021   HEMOGLOBIN A1C  09/03/2021    There are no preventive care reminders to display for this patient.  No results  found for: TSH Lab Results  Component Value Date   WBC 9.0 08/29/2020   HGB 12.6 08/29/2020   HCT 37.6 08/29/2020   MCV 92.8 08/29/2020   PLT 367 08/29/2020   Lab Results  Component Value Date   NA 143 03/03/2021   K 3.6 03/03/2021   CO2 31 03/03/2021   GLUCOSE 113 (H) 03/03/2021   BUN 6 (L) 03/03/2021   CREATININE 0.88 03/03/2021   BILITOT 0.5 03/03/2021   ALKPHOS 60 09/28/2016   AST 17 03/03/2021   ALT 12 03/03/2021   PROT 7.0 03/03/2021   ALBUMIN 3.9 09/28/2016   CALCIUM 9.9 03/03/2021   EGFR 68 03/03/2021   Lab Results  Component Value Date   CHOL 173 08/29/2020   Lab Results  Component Value Date   HDL 68 08/29/2020   Lab Results  Component Value Date   LDLCALC 86 08/29/2020   Lab Results  Component Value Date   TRIG 97 08/29/2020   Lab Results  Component Value Date   CHOLHDL 2.5 08/29/2020   Lab Results  Component Value Date   HGBA1C 6.6 (H) 03/03/2021      Assessment & Plan:   1. Type 2 diabetes mellitus with hyperglycemia, without long-term current use of insulin ([place]): Stable, recheck A1c today. - HgB A1c  2. Dyslipidemia associated with type 2 diabetes mellitus ([place]): Stable, continue statin, due for  repeat lipid panel.  - Lipid Profile  3. Age-related osteoporosis without current pathological fracture: Reviewed last DEXA, allergy to bisphosphonate, not on any vitamin D or calcium. Discussed at length, check Vitamin D level today and supplements sent to pharmacy.   - Vitamin D (25 hydroxy) - Vitamin D, Cholecalciferol, 25 MCG (1000 UT) TABS; Take 1 each by mouth daily.  Dispense: 60 tablet; Refill: 5 - Calcium Citrate 250 MG TABS; Take 1 tablet (250 mg total) by mouth daily.  Dispense: 60 tablet; Refill: 5  4. Suprapubic pain: UA to rule out infection.   - POCT Urinalysis Dipstick  5. Gastroesophageal reflux disease, unspecified whether esophagitis present: Stable, could not tolerate PPI, will avoid foods like chocolate that trigger  symptoms.    Follow-up: Return in about 6 months (around 03/07/2022).    Teodora Medici, DO

## 2021-09-08 LAB — LIPID PANEL
Cholesterol: 163 mg/dL (ref <200)
HDL: 72 mg/dL (ref >=50)
LDL Cholesterol (Calc): 72 mg/dL (calc)
Non-HDL Cholesterol (Calc): 91 mg/dL (calc) (ref <130)
Total CHOL/HDL Ratio: 2.3 (calc) (ref <5.0)
Triglycerides: 107 mg/dL (ref <150)

## 2021-09-08 LAB — HEMOGLOBIN A1C
Hgb A1c MFr Bld: 6.9 % of total Hgb — ABNORMAL HIGH (ref <5.7)
Mean Plasma Glucose: 151 mg/dL
eAG (mmol/L): 8.4 mmol/L

## 2021-09-08 LAB — VITAMIN D 25 HYDROXY (VIT D DEFICIENCY, FRACTURES): Vit D, 25-Hydroxy: 18 ng/mL — ABNORMAL LOW (ref 30–100)

## 2021-09-11 ENCOUNTER — Telehealth: Payer: Self-pay

## 2021-09-11 NOTE — Telephone Encounter (Signed)
Pt notified the Calcium citrate complex would be fine to take

## 2021-09-11 NOTE — Telephone Encounter (Signed)
Copied from CRM #[id number]. Topic: Quick Communication - Rx Refill/Question >> Sep 11, 2021 11:04 AM Randol Kern wrote: Pt called back to report that her vitamins are not in stock at [facility], she is requesting for a call back from the clinic. She wants to know what her PCP plans to do   Best contact: [phone]

## 2021-09-14 ENCOUNTER — Telehealth: Payer: Self-pay

## 2021-09-14 NOTE — Telephone Encounter (Signed)
Copied from CRM [id number]. Topic: General - Inquiry >> Sep 14, 2021  3:20 PM Crist Infante wrote: Reason for CRM: pt would like to know if Dr Caralee Ates thinks it would be good for her to get the shingles vaccine?  Her insurance will cover, and a friend of hers has shingles and she is concerned she will get too.

## 2021-09-15 NOTE — Telephone Encounter (Signed)
Pt wants to be scheduled please advise

## 2021-09-15 NOTE — Telephone Encounter (Signed)
Pt prefers the afternoon, would like to come Friday 24th. Says you can schedule her and then she will see on MyChart

## 2021-09-15 NOTE — Telephone Encounter (Signed)
Pt.notified

## 2021-09-25 ENCOUNTER — Ambulatory Visit (INDEPENDENT_AMBULATORY_CARE_PROVIDER_SITE_OTHER): Payer: Medicare HMO

## 2021-09-25 DIAGNOSIS — Z23 Encounter for immunization: Secondary | ICD-10-CM | POA: Diagnosis not present

## 2021-10-20 ENCOUNTER — Other Ambulatory Visit: Payer: Self-pay | Admitting: Family Medicine

## 2021-10-20 DIAGNOSIS — E1169 Type 2 diabetes mellitus with other specified complication: Secondary | ICD-10-CM

## 2021-10-20 DIAGNOSIS — E119 Type 2 diabetes mellitus without complications: Secondary | ICD-10-CM

## 2021-11-23 ENCOUNTER — Ambulatory Visit (INDEPENDENT_AMBULATORY_CARE_PROVIDER_SITE_OTHER): Payer: Medicare HMO

## 2021-11-23 DIAGNOSIS — Z23 Encounter for immunization: Secondary | ICD-10-CM

## 2021-11-24 ENCOUNTER — Ambulatory Visit: Payer: Self-pay

## 2021-11-24 NOTE — Telephone Encounter (Signed)
?  Chief Complaint: pulled muscle ?Symptoms: chest discomfort and pain when moving and coughing or laughing ?Frequency: yesterday ?Pertinent Negatives: NA ?Disposition: [] ED /[] Urgent Care (no appt availability in office) / [x] Appointment(In office/virtual)/ []  [facility]/ [] Home Care/ [] Refused Recommended Disposition /[] [facility]/ []  Follow-up with PCP ?Additional Notes: pt states she was cleaning the bathroom and felt a popping or pulling sensation and now she has pain and unable to talk loud, cough or laugh without having pain. I advised pt it sounds like she pulled a muscle and scheduled her an appt for tomorrow 11/25/21 at 1140 with Kiara Brady. Pt states she would be there but may be 5 mins late since her shower is broken and she would have to take a bath with boiling some hot water.  ? ? ?Reason for Disposition ? [1] MODERATE pain (e.g., interferes with normal activities) AND [2] present > 3 days ? ?Answer Assessment - Initial Assessment Questions ?1. MECHANISM: "How did the injury happen?" ?    Was cleaning the bathroom and felt a popping sensation ?2. ONSET: "When did the injury happen?" (e.g., minutes, hours, days ago) ?    yesterday ?3. LOCATION: "Where on the chest is the injury located?" "Where does it hurt?" ?    In the chest breast area ?4. CHEST OR RIB PAIN SEVERITY: "How bad is the pain?"  (e.g., Scale 1-10; mild, moderate, or severe) ?   - MILD (1-3): doesn't interfere with normal activities  ?   - MODERATE (4-7): interferes with normal activities or awakens from sleep ?   - SEVERE (8-10): excruciating pain, unable to do any normal activities   ?    moderate ?5. BREATHING DIFFICULTY: "Are you having any difficulty breathing?" If Yes, ask: "How bad is it?"  (e.g., none, mild, moderate, severe)  ?6: OTHER SYMPTOMS (e.g., cough, fever, rash)  ?    Unable to take to deep cough ? ?Protocols used: Chest Injury - Bending, Lifting, or Twisting-A-AH ? ?

## 2021-11-24 NOTE — Progress Notes (Signed)
Name: Kiara Brady   MRN: [id number]    DOB: [birth year]   Date:11/25/2021 ? ?     Progress Note ? ?Subjective ? ?Chief Complaint ? ?Chest Pain ? ?HPI ? ? ?States last Wynelle Link she was cleaning her bathroom- heard a "pop"  ?Motioned to indicate area under her left breast and sternum ?States pain is stabbing in character and aggravated by certain movements ?States she was here 11/23/21 for vaccine and when she got home she was experiencing pain in the chest and left arm  ?Reports pain is increased with bending forward ?States left arm pain is improving (she received shot in this arm) ?States chest feels tight at times  ?Aggravating:states pain is worsened with coughing and sneezing ?Alleviating:states holding the area seems to make it feel better, warm liquids seem to improve pain, warm compresses  ?Trauma: no falls or injuries ?Reproducible: no but feels better with palpation/ pressure  ?Denies jaw pain, back pain, nausea  ? ?Patient Active Problem List  ? Diagnosis Date Noted  ? Mild cognitive impairment 09/26/2020  ? Osteoporosis 02/06/2018  ? 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  ? Osteoarthritis of knees, 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  ? Hyperlipidemia 03/23/2016  ? Cardiac murmur, previously undiagnosed 01/23/2016  ? Heart murmur 09/03/2015  ? Chronic pain of both knees 06/30/2015  ? Dyslipidemia associated with type 2 diabetes mellitus (HCC) 02/26/2015  ? Diabetes mellitus, type 2 (HCC) 01/06/2015  ? ? ?Past Surgical History:  ?Procedure Laterality Date  ? VARICOSE VEIN SURGERY    ? ? ?Family History  ?Problem Relation Age of Onset  ? Heart disease Mother   ? Alzheimer's disease Mother   ? Heart attack Mother   ? Alcohol abuse Father   ? Cancer Maternal Grandmother   ?     breast  ? Breast cancer Maternal Grandmother   ? Diabetes Sister   ? ? ?Social History  ? ?Tobacco Use  ? Smoking status: Former  ? Smokeless tobacco: Never  ?Substance Use Topics  ? Alcohol use: No  ? ? ? ?Current Outpatient  Medications:  ?  atorvastatin (LIPITOR) 10 MG tablet, TAKE 1 TABLET BY MOUTH AT BEDTIME, Disp: 90 tablet, Rfl: 0 ?  Calcium Citrate 250 MG TABS, Take 1 tablet (250 mg total) by mouth daily., Disp: 60 tablet, Rfl: 5 ?  metFORMIN (GLUCOPHAGE) 500 MG tablet, Take 1 tablet by mouth once daily with breakfast, Disp: 90 tablet, Rfl: 0 ?  Vitamin D, Cholecalciferol, 25 MCG (1000 UT) TABS, Take 1 each by mouth daily., Disp: 60 tablet, Rfl: 5 ? ?Allergies  ?Allergen Reactions  ? Oxycodone Other (See Comments)  ? Penicillin G Hives and Shortness Of Breath  ? Protonix [Pantoprazole Sodium] Shortness Of Breath  ? Penicillins   ? Prednisone Cough  ? Vitamin D Analogs   ?  Chest pain  ? Xanax [Alprazolam]   ?  hyperactivity  ? Fosamax [Alendronate Sodium] Nausea Only  ? ? ? ? ? ?Review of Systems  ?Constitutional:  Negative for chills, fever and weight loss.  ?Respiratory:  Negative for cough, hemoptysis, shortness of breath and wheezing.   ?Cardiovascular:  Positive for chest pain. Negative for palpitations and leg swelling.  ?Gastrointestinal:  Negative for nausea and vomiting.  ?Musculoskeletal:  Positive for myalgias. Negative for back pain and falls.  ?Neurological:  Negative for dizziness, tingling and headaches.  ? ? ? ?Objective ? ?Vitals:  ? 11/25/21 1121  ?BP: 122/68  ?Pulse: 74  ?Resp: 16  ?  SpO2: 97%  ?Weight: 131 lb (59.4 kg)  ?Height: 5\' 2"  (1.575 m)  ? ? ?Body mass index is 23.96 kg/m?. ? ?Physical Exam ?Vitals reviewed.  ?Constitutional:   ?   Appearance: She is well-developed and normal weight.  ?HENT:  ?   Head: Normocephalic and atraumatic.  ?Cardiovascular:  ?   Rate and Rhythm: Normal rate and regular rhythm.  ?   Pulses: Normal pulses.  ?   Heart sounds: Normal heart sounds.  ?Pulmonary:  ?   Effort: Pulmonary effort is normal.  ?   Breath sounds: Normal breath sounds and air entry. No decreased breath sounds, wheezing or rhonchi.  ?Chest:  ?   Chest wall: Tenderness present. No mass, lacerations, deformity,  swelling, crepitus or edema. There is no dullness to percussion.  ? ? ?Musculoskeletal:  ?   Right lower leg: No edema.  ?   Left lower leg: No edema.  ?Lymphadenopathy:  ?   Upper Body:  ?   Right upper body: No supraclavicular adenopathy.  ?   Left upper body: No supraclavicular adenopathy.  ?Neurological:  ?   Mental Status: She is alert.  ?Psychiatric:     ?   Attention and Perception: Attention and perception normal.     ?   Mood and Affect: Mood and affect normal.     ?   Speech: Speech normal.     ?   Behavior: Behavior normal. Behavior is cooperative.  ? ? ? ?Recent Results (from the past 2160 hour(s))  ?Lipid Profile     Status: None  ? Collection Time: 09/07/21  3:57 PM  ?Result Value Ref Range  ? Cholesterol 163 <200 mg/dL  ? HDL 72 > OR = 50 mg/dL  ? Triglycerides 107 <150 mg/dL  ? LDL Cholesterol (Calc) 72 mg/dL (calc)  ?  Comment: Reference range: <100 ?11/05/21 ?Desirable range <100 mg/dL for primary prevention;   ?<70 mg/dL for patients with CHD or diabetic patients  ?with > or = 2 CHD risk factors. ?. ?LDL-C is now calculated using the Martin-Hopkins  ?calculation, which is a validated novel method providing  ?better accuracy than the Friedewald equation in the  ?estimation of LDL-C.  ?Marland Kitchen et al. Horald Pollen. Lenox Ahr): 2061-2068  ?(http://education.[facility].com/faq/FAQ164) ?  ? Total CHOL/HDL Ratio 2.3 <5.0 (calc)  ? Non-HDL Cholesterol (Calc) 91 [birth year]1971 mg/dL (calc)  ?  Comment: For patients with diabetes plus 1 major ASCVD risk  ?factor, treating to a non-HDL-C goal of <100 mg/dL  ?(LDL-C of <70 mg/dL) is considered a therapeutic  ?option. ?  ?HgB A1c     Status: Abnormal  ? Collection Time: 09/07/21  3:57 PM  ?Result Value Ref Range  ? Hgb A1c MFr Bld 6.9 (H) <5.7 % of total Hgb  ?  Comment: For someone without known diabetes, a hemoglobin A1c ?value of 6.5% or greater indicates that they may have  ?diabetes and this should be confirmed with a follow-up  ?test. ?. ?For someone with known  diabetes, a value <7% indicates  ?that their diabetes is well controlled and a value  ?greater than or equal to 7% indicates suboptimal  ?control. A1c targets should be individualized based on  ?duration of diabetes, age, comorbid conditions, and  ?other considerations. ?. ?Currently, no consensus exists regarding use of ?hemoglobin A1c for diagnosis of diabetes for children. ?. ?  ? Mean Plasma Glucose 151 mg/dL  ? eAG (mmol/L) 8.4 mmol/L  ?Vitamin D (25 hydroxy)  Status: Abnormal  ? Collection Time: 09/07/21  3:57 PM  ?Result Value Ref Range  ? Vit D, 25-Hydroxy 18 (L) 30 - 100 ng/mL  ?  Comment: Vitamin D Status         25-OH Vitamin D: ?. ?Deficiency:                    <20 ng/mL ?Insufficiency:             20 - 29 ng/mL ?Optimal:                 > or = 30 ng/mL ?Marland Kitchen. ?For 25-OH Vitamin D testing on patients on  ?D2-supplementation and patients for whom quantitation  ?of D2 and D3 fractions is required, the [facility](TM) ?25-OH VIT D, (D2,D3), LC/MS/MS is recommended: order  ?code 5366492888 (patients >2875yrs). ?See Note 1 ?. ?Note 1 ?. ?For additional information, please refer to  ?http://education.[facility].com/faq/FAQ199  ?(This link is being provided for informational/ ?educational purposes only.) ?  ?POCT Urinalysis Dipstick     Status: Normal  ? Collection Time: 09/07/21  3:57 PM  ?Result Value Ref Range  ? Color, UA yellow   ? Clarity, UA clear   ? Glucose, UA Negative Negative  ? Bilirubin, UA neg   ? Ketones, UA neg   ? Spec Grav, UA 1.020 1.010 - 1.025  ? Blood, UA neg   ? pH, UA 6.0 5.0 - 8.0  ? Protein, UA Negative Negative  ? Urobilinogen, UA 0.2 0.2 or 1.0 E.U./dL  ? Nitrite, UA neg   ? Leukocytes, UA Negative Negative  ? Appearance clear   ? Odor normal   ? ? ? ?PHQ2/9: ? ?  11/25/2021  ? 11:20 AM 09/07/2021  ?  3:20 PM 07/30/2021  ? 11:41 AM 05/18/2021  ? 10:48 AM 04/14/2021  ?  9:43 AM  ?Depression screen PHQ 2/9  ?Decreased Interest 0 0 0 0 1  ?Down, Depressed, Hopeless 0 0 0 0 1  ?PHQ - 2 Score  0 0 0 0 2  ?Altered sleeping 0 0 0  0  ?Tired, decreased energy 0 0 0  0  ?Change in appetite 0 0 0  0  ?Feeling bad or failure about yourself  0 0 0  0  ?Trouble concentrating 0 0 0  1  ?Moving slowly or fidgety/r

## 2021-11-25 ENCOUNTER — Ambulatory Visit (INDEPENDENT_AMBULATORY_CARE_PROVIDER_SITE_OTHER): Payer: Medicare HMO | Admitting: Physician Assistant

## 2021-11-25 ENCOUNTER — Encounter: Payer: Self-pay | Admitting: Physician Assistant

## 2021-11-25 VITALS — BP 122/68 | HR 74 | Resp 16 | Ht 62.0 in | Wt 131.0 lb

## 2021-11-25 DIAGNOSIS — R0789 Other chest pain: Secondary | ICD-10-CM

## 2021-11-25 DIAGNOSIS — E785 Hyperlipidemia, unspecified: Secondary | ICD-10-CM

## 2021-11-25 DIAGNOSIS — E1169 Type 2 diabetes mellitus with other specified complication: Secondary | ICD-10-CM | POA: Diagnosis not present

## 2021-11-25 DIAGNOSIS — E1165 Type 2 diabetes mellitus with hyperglycemia: Secondary | ICD-10-CM

## 2021-11-25 NOTE — Patient Instructions (Addendum)
Based on your symptoms and what you have told me I think you may have injured a muscle in your chest ?I recommend the following to help with resolution of your pain: ? ? ?Rest ?Warm compresses to the area (20 minutes on, minimum of 30 minutes off) ?You can alternate Tylenol (acetaminophen)  and Ibuprofen for pain management but Ibuprofen is typically preferred to reduce inflammation.  ?Tylenol is preferred due to your kidney function but you can use Ibuprofen sparingly for additional pain relief ?Gentle stretches and exercises that I have included in your paperwork ?Try to reduce excess strain to the area and rest as much as possible  ? ?If these measures do not lead to improvement in your symptoms over the next 2-4 weeks please let us know   ? ?

## 2021-12-07 ENCOUNTER — Ambulatory Visit: Payer: Self-pay | Admitting: *Deleted

## 2021-12-07 NOTE — Telephone Encounter (Signed)
Reason for Disposition ?? [1] Chest pain(s) lasting a few seconds AND [2] persists > 3 days ? ?Protocols used: Chest Pain-A-AH ? ?

## 2021-12-07 NOTE — Telephone Encounter (Signed)
?  Chief Complaint: Chest Pain ?Symptoms: Chest pain left sided, right sided, shoulders ?Frequency: Saw Erin 11/25/21. Onset 11/23/21 ?Pertinent Negatives: Patient denies nausea, sweating, palpitations. ?Disposition: [] ED /[] Urgent Care (no appt availability in office) / [x] Appointment(In office/virtual)/ []  [facility]/ [] Home Care/ [] Refused Recommended Disposition /[] [facility]/ []  Follow-up with PCP ?Additional Notes: Pt requesting MRI. Appt secured for tomorrow per pt's schedule. Advised ED for worsening symptoms.  Pt verbalizes understanding. ? Answer Assessment - Initial Assessment Questions ?1. LOCATION: "Where does it hurt?"   ?    Left chest, shoulder, under breast, right side all over at times. NOw back hurting ?2. RADIATION: "Does the pain go anywhere else?" (e.g., into neck, jaw, arms, back) ?    Back ?3. ONSET: "When did the chest pain begin?" (Minutes, hours or days)  ?    4/24. Saw Erin 11/25/21 ?4. PATTERN "Does the pain come and go, or has it been constant since it started?"  "Does it get worse with exertion?"  ?    Varies, "Roaming around" Varies in intensity ?5. DURATION: "How long does it last" (e.g., seconds, minutes, hours) ?    Constant ?6. SEVERITY: "How bad is the pain?"  (e.g., Scale 1-10; mild, moderate, or severe) ?   - MILD (1-3): doesn't interfere with normal activities  ?   - MODERATE (4-7): interferes with normal activities or awakens from sleep ?   - SEVERE (8-10): excruciating pain, unable to do any normal activities   ?    Varies  ?7. CARDIAC RISK FACTORS: "Do you have any history of heart problems or risk factors for heart disease?" (e.g., angina, prior heart attack; diabetes, high blood pressure, high cholesterol, smoker, or strong family history of heart disease) ?     ?8. PULMONARY RISK FACTORS: "Do you have any history of lung disease?"  (e.g., blood clots in lung, asthma, emphysema, birth control pills) ?     ?9. CAUSE: "What do you think is causing the  chest pain?" ?    Heard "POP" 11/23/21 ?10. OTHER SYMPTOMS: "Do you have any other symptoms?" (e.g., dizziness, nausea, vomiting, sweating, fever, difficulty breathing, cough) ?      SOB at times "Because of pain" Cough at times "Allergies" ? ?Protocols used: Chest Pain-A-AH ? ?

## 2021-12-08 ENCOUNTER — Encounter: Payer: Self-pay | Admitting: Family Medicine

## 2021-12-08 ENCOUNTER — Ambulatory Visit (INDEPENDENT_AMBULATORY_CARE_PROVIDER_SITE_OTHER): Payer: Medicare HMO | Admitting: Family Medicine

## 2021-12-08 ENCOUNTER — Ambulatory Visit
Admission: RE | Admit: 2021-12-08 | Discharge: 2021-12-08 | Disposition: A | Discharge status: Home / Self Care | Payer: Medicare HMO | Source: Ambulatory Visit | Attending: Family Medicine | Admitting: Family Medicine

## 2021-12-08 ENCOUNTER — Ambulatory Visit
Admission: RE | Admit: 2021-12-08 | Discharge: 2021-12-08 | Disposition: A | Discharge status: Home / Self Care | Payer: Medicare HMO | Attending: Family Medicine | Admitting: Family Medicine

## 2021-12-08 VITALS — BP 112/74 | HR 70 | Temp 97.8°F | Resp 16 | Ht 62.0 in | Wt 130.5 lb

## 2021-12-08 DIAGNOSIS — M19012 Primary osteoarthritis, left shoulder: Secondary | ICD-10-CM | POA: Diagnosis not present

## 2021-12-08 DIAGNOSIS — R0789 Other chest pain: Secondary | ICD-10-CM | POA: Diagnosis not present

## 2021-12-08 DIAGNOSIS — N644 Mastodynia: Secondary | ICD-10-CM

## 2021-12-08 DIAGNOSIS — R079 Chest pain, unspecified: Secondary | ICD-10-CM | POA: Diagnosis not present

## 2021-12-08 NOTE — Progress Notes (Signed)
? ? ?Patient ID: Kiara Brady, female    DOB: 11/09/1945, 76 y.o.   MRN: [id number] ? ?PCP: Teodora Medici, DO ? ?Chief Complaint  ?Patient presents with  ? Follow-up  ?  "Pop" chest/ribs   ? ? ?Subjective:  ? ?Kiara Brady is a 76 y.o. female, presents to clinic with CC of the following: ? ?HPI  ?Presents for continued chest wall pain  ? ?Previously seen by Junie Panning PA for same on 4/26 ?  ?States last Nancy Fetter she was cleaning her bathroom- heard a "pop"  ?Motioned to indicate area under her left breast and sternum ?States pain is stabbing in character and aggravated by certain movements ?States she was here 11/23/21 for vaccine and when she got home she was experiencing pain in the chest and left arm  ?Reports pain is increased with bending forward ?States left arm pain is improving (she received shot in this arm) ?States chest feels tight at times  ?Aggravating:states pain is worsened with coughing and sneezing ?Alleviating:states holding the area seems to make it feel better, warm liquids seem to improve pain, warm compresses  ?Trauma: no falls or injuries ?Reproducible: no but feels better with palpation/ pressure  ?Denies jaw pain, back pain, nausea  ? ?Felt in her left breast and chest -in her sternum and left anterior ribs, continues to be sore and have intermittent popping ?She was told to follow-up if it did not go away on its own and it has not changed since being evaluated by Darlis Loan on 4/26 ? ?Movement and palpation exacerbate the discomfort and she has not found any alleviating factors ?It is not worsened with deep breathing or physical exertion, she denies any palpitations, shortness of breath, orthopnea ?She has history of neck and back pain, multiple joints with osteoarthritis, she has declined in the past to see Ortho or go to physical therapy ? ?She reports a concern of breast cancer, her sister has breast cancer but she has called inquiring about a mammogram and she states that it would cost  almost $1000 so she cannot afford a diagnostic mammogram, she has had no breast tissue changes, skin changes, nipple discharge ? ?Patient Active Problem List  ? Diagnosis Date Noted  ? Mild cognitive impairment 09/26/2020  ? Osteoporosis 02/06/2018  ? 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  ? Osteoarthritis of knees, 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  ? Hyperlipidemia 03/23/2016  ? Cardiac murmur, previously undiagnosed 01/23/2016  ? Heart murmur 09/03/2015  ? Chronic pain of both knees 06/30/2015  ? Dyslipidemia associated with type 2 diabetes mellitus ([place]) 02/26/2015  ? Diabetes mellitus, type 2 ([place]) 01/06/2015  ? ? ? ? ?Current Outpatient Medications:  ?  atorvastatin (LIPITOR) 10 MG tablet, TAKE 1 TABLET BY MOUTH AT BEDTIME, Disp: 90 tablet, Rfl: 0 ?  Calcium Citrate 250 MG TABS, Take 1 tablet (250 mg total) by mouth daily., Disp: 60 tablet, Rfl: 5 ?  metFORMIN (GLUCOPHAGE) 500 MG tablet, Take 1 tablet by mouth once daily with breakfast, Disp: 90 tablet, Rfl: 0 ?  Vitamin D, Cholecalciferol, 25 MCG (1000 UT) TABS, Take 1 each by mouth daily., Disp: 60 tablet, Rfl: 5 ? ? ?Allergies  ?Allergen Reactions  ? Oxycodone Other (See Comments)  ? Penicillin G Hives and Shortness Of Breath  ? Protonix [Pantoprazole Sodium] Shortness Of Breath  ? Penicillins   ? Prednisone Cough  ? Vitamin D Analogs   ?  Chest pain  ? Xanax [Alprazolam]   ?  hyperactivity  ? Fosamax [Alendronate Sodium] Nausea Only  ? ? ? ?Social History  ? ?Tobacco Use  ? Smoking status: Former  ? Smokeless tobacco: Never  ?Vaping Use  ? Vaping Use: Never used  ?Substance Use Topics  ? Alcohol use: No  ? Drug use: No  ?  ? ? ?Chart Review Today: ?I personally reviewed active problem list, medication list, allergies, family history, social history, health maintenance, notes from last encounter, lab results, imaging with the patient/caregiver today. ? ? ?Review of Systems  ?Constitutional: Negative.   ?HENT: Negative.    ?Eyes: Negative.    ?Respiratory: Negative.    ?Cardiovascular: Negative.   ?Gastrointestinal: Negative.   ?Endocrine: Negative.   ?Genitourinary: Negative.   ?Musculoskeletal: Negative.   ?Skin: Negative.   ?Allergic/Immunologic: Negative.   ?Neurological: Negative.   ?Hematological: Negative.   ?Psychiatric/Behavioral: Negative.    ?All other systems reviewed and are negative. ? ?   ?Objective:  ? ?Vitals:  ? 12/08/21 1058 12/08/21 1100 12/08/21 1134  ?BP: 112/70 114/72 112/74  ?Pulse: 72 72 70  ?Resp: 16    ?Temp: 97.8 ?F (36.6 ?C)    ?TempSrc: Oral    ?SpO2: 98%    ?Weight: 130 lb 8 oz (59.2 kg)    ?Height: 5\' 2"  (1.575 m)    ?  ?Body mass index is 23.87 kg/m?. ? ?Physical Exam ?Vitals and nursing note reviewed.  ?Constitutional:   ?   General: She is not in acute distress. ?   Appearance: Normal appearance. She is well-developed and normal weight. She is not ill-appearing, toxic-appearing or diaphoretic.  ?   Interventions: Face mask in place.  ?   Comments: Elderly, well-appearing female  ?HENT:  ?   Head: Normocephalic and atraumatic.  ?   Right Ear: External ear normal.  ?   Left Ear: External ear normal.  ?Eyes:  ?   General: Lids are normal. No scleral icterus.    ?   Right eye: No discharge.     ?   Left eye: No discharge.  ?   Conjunctiva/sclera: Conjunctivae normal.  ?Neck:  ?   Trachea: Phonation normal. No tracheal deviation.  ?Cardiovascular:  ?   Rate and Rhythm: Normal rate and regular rhythm.  ?   Pulses: Normal pulses.     ?     Radial pulses are 2+ on the right side and 2+ on the left side.  ?     Posterior tibial pulses are 2+ on the right side and 2+ on the left side.  ?   Heart sounds: Normal heart sounds. No murmur heard. ?  No friction rub. No gallop.  ?Pulmonary:  ?   Effort: Pulmonary effort is normal. No respiratory distress.  ?   Breath sounds: Normal breath sounds. No stridor. No wheezing, rhonchi or rales.  ?Chest:  ?   Chest wall: Tenderness present. No deformity, swelling, crepitus or edema.   ?Breasts: ?   Left: Normal. No swelling, bleeding, mass, skin change or tenderness.  ? ? ?   Comments: Area of tenderness outlined ?Abdominal:  ?   General: Bowel sounds are normal. There is no distension.  ?   Palpations: Abdomen is soft.  ?Musculoskeletal:  ?   Right lower leg: No edema.  ?   Left lower leg: No edema.  ?Lymphadenopathy:  ?   Upper Body:  ?   Left upper body: No supraclavicular, axillary or pectoral adenopathy.  ?Skin: ?   General: Skin  is warm and dry.  ?   Coloration: Skin is not jaundiced or pale.  ?   Findings: No rash.  ?Neurological:  ?   Mental Status: She is alert.  ?   Motor: No abnormal muscle tone.  ?   Gait: Gait normal.  ?Psychiatric:     ?   Mood and Affect: Mood normal.     ?   Speech: Speech normal.     ?   Behavior: Behavior normal.  ?  ? ?Results for orders placed or performed in visit on 09/07/21  ?Lipid Profile  ?Result Value Ref Range  ? Cholesterol 163 <200 mg/dL  ? HDL 72 > OR = 50 mg/dL  ? Triglycerides 107 <150 mg/dL  ? LDL Cholesterol (Calc) 72 mg/dL (calc)  ? Total CHOL/HDL Ratio 2.3 <5.0 (calc)  ? Non-HDL Cholesterol (Calc) 91 <130 mg/dL (calc)  ?HgB A1c  ?Result Value Ref Range  ? Hgb A1c MFr Bld 6.9 (H) <5.7 % of total Hgb  ? Mean Plasma Glucose 151 mg/dL  ? eAG (mmol/L) 8.4 mmol/L  ?Vitamin D (25 hydroxy)  ?Result Value Ref Range  ? Vit D, 25-Hydroxy 18 (L) 30 - 100 ng/mL  ?POCT Urinalysis Dipstick  ?Result Value Ref Range  ? Color, UA yellow   ? Clarity, UA clear   ? Glucose, UA Negative Negative  ? Bilirubin, UA neg   ? Ketones, UA neg   ? Spec Grav, UA 1.020 1.010 - 1.025  ? Blood, UA neg   ? pH, UA 6.0 5.0 - 8.0  ? Protein, UA Negative Negative  ? Urobilinogen, UA 0.2 0.2 or 1.0 E.U./dL  ? Nitrite, UA neg   ? Leukocytes, UA Negative Negative  ? Appearance clear   ? Odor normal   ? ? ?   ?Assessment & Plan:  ? ?1. Chest wall pain ?Patient presents for several weeks of anterior chest wall pain from sternum to left anterior ribs when she was seen a few weeks ago  pain was felt a little more in to the anterior inferior left ribs  ?No imaging was previously done ?Patient has rested and applied heat-heat therapy improves the pain temporarily but she continues to experience discomfort

## 2022-01-25 ENCOUNTER — Other Ambulatory Visit: Payer: Self-pay | Admitting: Internal Medicine

## 2022-01-25 DIAGNOSIS — E119 Type 2 diabetes mellitus without complications: Secondary | ICD-10-CM

## 2022-01-25 DIAGNOSIS — E1169 Type 2 diabetes mellitus with other specified complication: Secondary | ICD-10-CM

## 2022-01-26 MED ORDER — ATORVASTATIN CALCIUM 10 MG PO TABS: 10.0000 mg | ORAL_TABLET | Freq: Every day | ORAL | 0 refills | Status: DC

## 2022-01-26 MED ORDER — METFORMIN HCL 500 MG PO TABS: 500.0000 mg | ORAL_TABLET | Freq: Every day | ORAL | 0 refills | Status: DC

## 2022-02-03 ENCOUNTER — Other Ambulatory Visit: Payer: Self-pay | Admitting: Family Medicine

## 2022-02-03 ENCOUNTER — Other Ambulatory Visit: Payer: Self-pay | Admitting: Internal Medicine

## 2022-02-03 DIAGNOSIS — Z1231 Encounter for screening mammogram for malignant neoplasm of breast: Secondary | ICD-10-CM

## 2022-02-08 ENCOUNTER — Ambulatory Visit: Payer: Self-pay | Admitting: *Deleted

## 2022-02-08 ENCOUNTER — Ambulatory Visit (INDEPENDENT_AMBULATORY_CARE_PROVIDER_SITE_OTHER): Payer: Medicare HMO | Admitting: Family Medicine

## 2022-02-08 ENCOUNTER — Encounter: Payer: Self-pay | Admitting: Family Medicine

## 2022-02-08 VITALS — BP 140/70 | HR 77 | Temp 98.2°F | Resp 16 | Ht 62.0 in | Wt 132.5 lb

## 2022-02-08 DIAGNOSIS — R21 Rash and other nonspecific skin eruption: Secondary | ICD-10-CM | POA: Diagnosis not present

## 2022-02-08 MED ORDER — PREDNISONE 20 MG PO TABS: ORAL_TABLET | ORAL | 0 refills | Status: DC

## 2022-02-08 MED ORDER — CETIRIZINE HCL 10 MG PO TABS: 10.0000 mg | ORAL_TABLET | Freq: Every day | ORAL | 0 refills | Status: DC

## 2022-02-08 MED ORDER — DIPHENHYDRAMINE HCL 25 MG PO TABS: 25.0000 mg | ORAL_TABLET | Freq: Three times a day (TID) | ORAL | 0 refills | Status: DC | PRN

## 2022-02-08 NOTE — Telephone Encounter (Signed)
  Chief Complaint: Possible Poison Ivy Symptoms: Rash "All over body and eyes." States probably got into poison ivy in yard Friday. Severe itching Frequency: Friday night Pertinent Negatives: Patient denies  Disposition: [] ED /[] Urgent Care (no appt availability in office) / [x] Appointment(In office/virtual)/ []  [facility]/ [] Home Care/ [] Refused Recommended Disposition /[] [facility]/ []  Follow-up with PCP Additional Notes: Appt secured for today, care advise provided, verbalizes understanding. Reason for Disposition  Rash involves more than 20% of the body  Answer Assessment - Initial Assessment Questions 1. APPEARANCE of RASH: "Describe the rash."      Red raised "Think I got in poison Ivy" 2. LOCATION: "Where is the rash located?"  (e.g., face, genitals, hands, legs)     Eyes, face, neck, stomach, "Everywhere" 3. SIZE: "How large is the rash?"      Everywhere 4. ONSET: "When did the rash begin?"      Friday night 5. ITCHING: "Does the rash itch?" If Yes, ask: "How bad is it?"   - MILD - doesn't interfere with normal activities   - MODERATE-SEVERE: interferes with work, school, sleep, or other activities      Severe itching 6. EXPOSURE:  "How were you exposed to the plant (poison ivy, poison oak, sumac)"  "When were you exposed?"       Yes, Friday 7. PAST HISTORY: "Have you had a poison ivy rash before?" If Yes, ask: "How bad was it?"     Yes, "A little"  Protocols used: Poison Ivy - Oak - [place]-A-AH

## 2022-02-08 NOTE — Patient Instructions (Signed)
Start the prednisone today - take with food to avoid upset stomach Take the zyrtec starting tonight at bedtime You can take benadryl as needed as well if your itching is still severe enough to cause you to scratch your skin off.   Poison Ivy Dermatitis Poison ivy dermatitis is redness and soreness of the skin caused by chemicals in the leaves of the poison ivy plant. You may have very bad itching, swelling, a rash, and blisters. What are the causes? Touching a poison ivy plant. Touching something that has the chemical on it. This may include animals or objects that have come in contact with the plant. What increases the risk? Going outdoors often in wooded or [place] areas. Going outdoors without wearing protective clothing, such as closed shoes, long pants, and a long-sleeved shirt. What are the signs or symptoms?  Skin redness. Very bad itching. A rash that often includes bumps and blisters. The rash usually appears 48 hours after exposure, if you have been exposed before. If this is the first time you have been exposed, the rash may not appear until a week after exposure. Swelling. This may occur if the reaction is very bad. Symptoms usually last for 1-2 weeks. The first time you develop this condition, symptoms may last 3-4 weeks. How is this treated? This condition may be treated with: Hydrocortisone cream or calamine lotion to relieve itching. Oatmeal baths to soothe the skin. Medicines, such as over-the-counter antihistamine tablets. Oral steroid medicine for more severe reactions. Follow these instructions at home: Medicines Take or apply over-the-counter and prescription medicines only as told by your doctor. Use hydrocortisone cream or calamine lotion as needed to help with itching. General instructions Do not scratch or rub your skin. Put a cold, wet cloth (cold compress) on the affected areas or take baths in cool water. This will help with itching. Avoid hot baths and  showers. Take oatmeal baths as needed. Use colloidal oatmeal. You can get this at a pharmacy or grocery store. Follow the instructions on the package. While you have the rash, wash your clothes right after you wear them. Keep all follow-up visits as told by your health care provider. This is important. How is this prevented?  Know what poison ivy looks like, so you can avoid it. This plant has three leaves with flowering branches on a single stem. The leaves are glossy. The leaves have uneven edges that come to a point at the front. If you touch poison ivy, wash your skin with soap and water right away. Be sure to wash under your fingernails. When hiking or camping, wear long pants, a long-sleeved shirt, tall socks, and hiking boots. You can also use a lotion on your skin that helps to prevent contact with poison ivy. If you think that your clothes or outdoor gear came in contact with poison ivy, rinse them off with a garden hose before you bring them inside your house. When doing yard work or gardening, wear gloves, long sleeves, long pants, and boots. Wash your garden tools and gloves if they come in contact with poison ivy. If you think that your pet has come into contact with poison ivy, wash him or her with pet shampoo and water. Make sure to wear gloves while washing your pet. Contact a doctor if: You have open sores in the rash area. You have more redness, swelling, or pain in the rash area. You have redness that spreads beyond the rash area. You have fluid, blood, or pus  coming from the rash area. You have a fever. You have a rash over a large area of your body. You have a rash on your eyes, mouth, or genitals. Your rash does not get better after a few weeks. Get help right away if: Your face swells or your eyes swell shut. You have trouble breathing. You have trouble swallowing. These symptoms may be an emergency. Do not wait to see if the symptoms will go away. Get medical help  right away. Call your local emergency services (911 in the [place] Do not drive yourself to the hospital. Summary Poison ivy dermatitis is redness and soreness of the skin caused by chemicals in the leaves of the poison ivy plant. You may have skin redness, very bad itching, swelling, and a rash. Do not scratch or rub your skin. Take or apply over-the-counter and prescription medicines only as told by your doctor. This information is not intended to replace advice given to you by your health care provider. Make sure you discuss any questions you have with your health care provider. Document Revised: 05/04/2021 Document Reviewed: 05/04/2021 Elsevier Patient Education  2023 [employer].

## 2022-02-08 NOTE — Progress Notes (Signed)
Patient ID: Kiara Brady, female    DOB: 03/01/1946, 76 y.o.   MRN: [id number]  PCP: Margarita Mail, DO  Chief Complaint  Patient presents with   Poison Ivy    Pt states all over body, Friday started itching and  Saturday got worst.    Subjective:   Kiara Brady is a 76 y.o. female, presents to clinic with CC of the following:  She went for a walk and was cutting and clipping back plants - she believes was touching poison ivy, started itching and rash to bilateral arms but gradually covered trunk, groin, arms, legs and chest with itching and some red bumps  Rash This is a new problem. Episode onset: past 2-3 d. The rash is diffuse. The rash is characterized by itchiness and redness. She was exposed to plant contact. Pertinent negatives include no anorexia, congestion, cough, diarrhea, eye pain, facial edema, fatigue, fever, joint pain, nail changes, rhinorrhea, shortness of breath, sore throat or vomiting. Past treatments include nothing (just bathing with antibiotic soaps). The treatment provided no relief. There is no history of allergies, asthma, eczema or varicella.      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   Osteoarthritis of knees, 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   Hyperlipidemia 03/23/2016   Cardiac murmur, previously undiagnosed 01/23/2016   Heart murmur 09/03/2015   Chronic pain of both knees 06/30/2015   Dyslipidemia associated with type 2 diabetes mellitus (HCC) 02/26/2015   Diabetes mellitus, type 2 (HCC) 01/06/2015      Current Outpatient Medications:    atorvastatin (LIPITOR) 10 MG tablet, TAKE 1 TABLET BY MOUTH AT BEDTIME, Disp: 90 tablet, Rfl: 0   atorvastatin (LIPITOR) 10 MG tablet, Take 1 tablet (10 mg total) by mouth at bedtime., Disp: 90 tablet, Rfl: 0   Calcium Citrate 250 MG TABS, Take 1 tablet (250 mg total) by  mouth daily., Disp: 60 tablet, Rfl: 5   metFORMIN (GLUCOPHAGE) 500 MG tablet, Take 1 tablet by mouth once daily with breakfast, Disp: 90 tablet, Rfl: 0   metFORMIN (GLUCOPHAGE) 500 MG tablet, Take 1 tablet (500 mg total) by mouth daily with breakfast., Disp: 90 tablet, Rfl: 0   Vitamin D, Cholecalciferol, 25 MCG (1000 UT) TABS, Take 1 each by mouth daily., Disp: 60 tablet, Rfl: 5   Allergies  Allergen Reactions   Oxycodone Other (See Comments)   Penicillin G Hives and Shortness Of Breath   Protonix [Pantoprazole Sodium] Shortness Of Breath   Penicillins    Prednisone Cough   Vitamin D Analogs     Chest pain   Xanax [Alprazolam]     hyperactivity   Fosamax [Alendronate Sodium] Nausea Only     Social History   Tobacco Use   Smoking status: Former   Smokeless tobacco: Never  Building services engineer Use: Never used  Substance Use Topics   Alcohol use: No   Drug use: No      Chart Review Today: I personally reviewed active problem list, medication list, allergies, family history, social history, health maintenance, notes from last encounter, lab results, imaging with the patient/caregiver today.   Review of Systems  Constitutional: Negative.  Negative for fatigue and fever.  HENT: Negative.  Negative for congestion, rhinorrhea and sore throat.   Eyes: Negative.  Negative for pain.  Respiratory: Negative.  Negative for cough and shortness  of breath.   Cardiovascular: Negative.   Gastrointestinal: Negative.  Negative for anorexia, diarrhea and vomiting.  Endocrine: Negative.   Genitourinary: Negative.   Musculoskeletal: Negative.  Negative for joint pain.  Skin:  Positive for rash. Negative for nail changes.  Allergic/Immunologic: Negative.   Neurological: Negative.   Hematological: Negative.   Psychiatric/Behavioral: Negative.    All other systems reviewed and are negative.      Objective:   Vitals:   02/08/22 1137  BP: 140/70  Pulse: 77  Resp: 16  Temp: 98.2 F  (36.8 C)  TempSrc: Oral  SpO2: 97%  Weight: 132 lb 8 oz (60.1 kg)  Height: 5\' 2"  (1.575 m)    Body mass index is 24.23 kg/m.  Physical Exam Vitals and nursing note reviewed.  Constitutional:      General: She is not in acute distress.    Appearance: She is not ill-appearing, toxic-appearing or diaphoretic.  HENT:     Head: Normocephalic and atraumatic.     Right Ear: External ear normal.     Left Ear: External ear normal.     Nose: Nose normal.     Mouth/Throat:     Mouth: Mucous membranes are moist.     Pharynx: Oropharynx is clear. No oropharyngeal exudate or posterior oropharyngeal erythema.  Eyes:     General:        Right eye: No discharge.        Left eye: No discharge.     Conjunctiva/sclera: Conjunctivae normal.     Comments: Some mild swelling to face, no lip swelling, normal phonation, no stridor  Cardiovascular:     Rate and Rhythm: Normal rate and regular rhythm.     Pulses: Normal pulses.     Heart sounds: Normal heart sounds.  Pulmonary:     Effort: Pulmonary effort is normal. No respiratory distress.     Breath sounds: Normal breath sounds. No stridor. No wheezing, rhonchi or rales.  Abdominal:     General: Bowel sounds are normal.  Skin:    Findings: Rash present.     Comments: Diffuse excoriations all over b/l arms and upper chest  Neurological:     Mental Status: She is alert. Mental status is at baseline.     Gait: Gait normal.  Psychiatric:        Mood and Affect: Mood normal.        Behavior: Behavior normal.      Results for orders placed or performed in visit on 09/07/21  Lipid Profile  Result Value Ref Range   Cholesterol 163 <200 mg/dL   HDL 72 > OR = 50 mg/dL   Triglycerides 11/05/21 353 mg/dL   LDL Cholesterol (Calc) 72 mg/dL (calc)   Total CHOL/HDL Ratio 2.3 <5.0 (calc)   Non-HDL Cholesterol (Calc) 91 <614 mg/dL (calc)  HgB <431  Result Value Ref Range   Hgb A1c MFr Bld 6.9 (H) <5.7 % of total Hgb   Mean Plasma Glucose 151 mg/dL    eAG (mmol/L) 8.4 mmol/L  Vitamin D (25 hydroxy)  Result Value Ref Range   Vit D, 25-Hydroxy 18 (L) 30 - 100 ng/mL  POCT Urinalysis Dipstick  Result Value Ref Range   Color, UA yellow    Clarity, UA clear    Glucose, UA Negative Negative   Bilirubin, UA neg    Ketones, UA neg    Spec Grav, UA 1.020 1.010 - 1.025   Blood, UA neg    pH, UA 6.0  5.0 - 8.0   Protein, UA Negative Negative   Urobilinogen, UA 0.2 0.2 or 1.0 E.U./dL   Nitrite, UA neg    Leukocytes, UA Negative Negative   Appearance clear    Odor normal        Assessment & Plan:     ICD-10-CM   1. Rash and nonspecific skin eruption  R21 cetirizine (ZYRTEC ALLERGY) 10 MG tablet    predniSONE (DELTASONE) 20 MG tablet    diphenhydrAMINE (BENADRYL ALLERGY) 25 MG tablet     Suspect contact dermatitis - discussed steroid taper - she was worried about prednisone SE so we did a taper that started at 40 not 60 mg Encouraged her to try daily antihistamine and use benadryl as rescue for severe itching  Discussed rebound dermatitis - encouraged her to finish the steroid taper and to not suddenly stop with mild drug SE (she is very nervous about meds/SE/allergies - including she will not take her metformin and statin together)  Hand out given, meds reviewed many times  Pt did have some mild facial swelling but no lip swelling or intraoral abnormality - airway patent, no increased WOB, SOB, change in phonation, wheeze  Close f/up if any worsening     [name], PA-C 02/08/22 11:42 AM

## 2022-02-09 ENCOUNTER — Ambulatory Visit: Payer: Self-pay | Admitting: *Deleted

## 2022-02-09 NOTE — Telephone Encounter (Signed)
  Chief Complaint: indigestion, somewhat sob Symptoms: burning in upper chest Frequency: after medication and won't go away Pertinent Negatives: Patient denies chest pain such as cardiac, she said SOB then she talked a whole lot and said she feels better and it is just the burning in her chest making her  feel like that. Disposition: [] ED /[] Urgent Care (no appt availability in office) / [] Appointment(In office/virtual)/ []  [facility]/ [] Home Care/ [] Refused Recommended Disposition /[] [facility]/ [x]  Follow-up with PCP Additional Notes:  Started on Prednisone, Zyrtec and Benadryl and something is causing severe indigestion. She is requesting a prescription strength antacid to get rid of the heart burn. She thinks the prednisone may be causing indigestion but it is relieving the itching so she wants to stay on it. She will check with pharmacy in a while.  Reason for Disposition  Chest pain(s) lasting a few seconds  Answer Assessment - Initial Assessment Questions 1. LOCATION: "Where does it hurt?"       Between breast 2. RADIATION: "Does the pain go anywhere else?" (e.g., into neck, jaw, arms, back)     no 3. ONSET: "When did the chest pain begin?" (Minutes, hours or days)      When took new meds 4. PATTERN "Does the pain come and go, or has it been constant since it started?"  "Does it get worse with exertion?"      Comes and goes especially after med and when lay down 5. DURATION: "How long does it last" (e.g., seconds, minutes, hours)     Has had it all day 6. SEVERITY: "How bad is the pain?"  (e.g., Scale 1-10; mild, moderate, or severe)    - MILD (1-3): doesn't interfere with normal activities     - MODERATE (4-7): interferes with normal activities or awakens from sleep    - SEVERE (8-10): excruciating pain, unable to do any normal activities       Hurts and burns a lot  9. CAUSE: "What do you think is causing the chest pain?"     Indigestion 10. OTHER  SYMPTOMS: "Do you have any other symptoms?" (e.g., dizziness, nausea, vomiting, sweating, fever, difficulty breathing, cough)       Slightly, talks a lot 11. PREGNANCY: "Is there any chance you are pregnant?" "When was your last menstrual period?"       no  Protocols used: Chest Pain-A-AH

## 2022-02-09 NOTE — Telephone Encounter (Signed)
Pt saw leisa tapia yesterday

## 2022-02-10 NOTE — Telephone Encounter (Signed)
Called pt no answer left vm to call back office. ?

## 2022-02-10 NOTE — Telephone Encounter (Signed)
Pt has been informed of Leisa message pt verbalized understand and will get back to [place] if wanting a different treatment for rash.

## 2022-03-08 ENCOUNTER — Encounter: Payer: Self-pay | Admitting: Internal Medicine

## 2022-03-08 ENCOUNTER — Ambulatory Visit (INDEPENDENT_AMBULATORY_CARE_PROVIDER_SITE_OTHER): Payer: Medicare HMO | Admitting: Internal Medicine

## 2022-03-08 VITALS — BP 116/78 | HR 84 | Temp 98.0°F | Resp 16 | Ht 62.0 in | Wt 128.8 lb

## 2022-03-08 DIAGNOSIS — E785 Hyperlipidemia, unspecified: Secondary | ICD-10-CM | POA: Diagnosis not present

## 2022-03-08 DIAGNOSIS — E1169 Type 2 diabetes mellitus with other specified complication: Secondary | ICD-10-CM | POA: Diagnosis not present

## 2022-03-08 DIAGNOSIS — E1165 Type 2 diabetes mellitus with hyperglycemia: Secondary | ICD-10-CM

## 2022-03-08 LAB — POCT GLYCOSYLATED HEMOGLOBIN (HGB A1C): Hemoglobin A1C: 7.3 % — AB (ref 4.0–5.6)

## 2022-03-08 MED ORDER — METFORMIN HCL 500 MG PO TABS: 500.0000 mg | ORAL_TABLET | Freq: Two times a day (BID) | ORAL | 1 refills | Status: DC

## 2022-03-08 NOTE — Patient Instructions (Addendum)
It was great seeing you today!  Plan discussed at today's visit: -A1c higher today 7.3% -Metformin 500 twice a day, decrease chocolate in the diet  -Urine test today   Follow up in: 6 months   Take care and let us know if you have any questions or concerns prior to your next visit.  Dr. Caralee Ates

## 2022-03-08 NOTE — Progress Notes (Signed)
Established Patient Office Visit  Subjective:  Patient ID: Kiara Brady, female    DOB: [birth year]  Age: 76 y.o. MRN: [id number]  CC:  Chief Complaint  Patient presents with   Follow-up   Hyperlipidemia   Diabetes    HPI [name] presents for follow up on chronic medical conditions. Patient states she doesn't want to take any medication other than the Metformin and Lipitor, because everything she takes makes her short of breath. Vitamin D, calcium Benadryl and Zyrtec all discontinued today for this reason.   Diabetes, Type 2: -Last A1c 6.9% 2/23 -Medications: Metformin 500  -Patient is compliant with the above medications and reports no side effects.  -Checking BG at home: No -Eye exam: August 2022, due  -Foot exam: UTD -Microalbumin: Due -Statin: yes -PNA vaccine: yes -Denies symptoms of hypoglycemia, polyuria, polydipsia, numbness extremities, foot ulcers/trauma.   HLD: -Medications: Lipitor 10 -Patient is compliant with above medications and reports no side effects.  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 - allergy to bisphosphates, allergy to Vitamin D and is lactose intolerant and doesn't get a lot calcium in diet  Past Medical History:  Diagnosis Date   Diabetes mellitus without complication ([place])    Hyperlipidemia    Osteopenia of neck of femur 12/19/2017   Osteoporosis 02/06/2018   July 2019   Polio    h[place] of pol[place] a child [place]    Past Surgical History:  Procedure Laterality Date   VARICOSE VEIN SURGERY       Family History  Problem Relation Age of Onset   Heart disease Mother    Alzheimer's disease Mother    Heart attack Mother    Alcohol abuse Father    Cancer Maternal Grandmother        breast   Breast cancer Maternal Grandmother    Diabetes Sister     Social History   Socioeconomic History   Marital status: Divorced    Spouse name: Not on file   Number of children: 2   Years of education: Not on file   Highest education level: Master's degree (e.g., MA, MS, MEng, MEd, MSW, MBA)  Occupational History   Not on file  Tobacco Use   Smoking status: Former   Smokeless tobacco: Never  Vaping Use   Vaping Use: Never used  Substance and Sexual Activity   Alcohol use: No   Drug use: No   Sexual activity: Not Currently  Other Topics Concern   Not on file  Social History Narrative   Pt lives alone, daughter is in another country and son lives in [place].    Originally from the [place] Strain: Low Risk  (04/14/2021)   Overall Financial Resource Strain (CARDIA)    Difficulty of Paying Living Expenses: Not hard at all  Food Insecurity: No Food Insecurity (04/14/2021)   Hunger Vital Sign    Worried About Running Out  of Food in the Last Year: Never true    [place] in the Last Year: Never true  Transportation Needs: No Transportation Needs (04/14/2021)   PRAPARE - Hydrologist (Medical): No    Lack of Transportation (Non-Medical): No  Physical Activity: Inactive (04/14/2021)   Exercise Vital Sign    Days of Exercise per Week: 0 days    Minutes of Exercise per Session: 0 min  Stress: Stress Concern Present (04/14/2021)   [place]    Feeling of Stress : Rather much  Social Connections: Socially Isolated (04/14/2021)   Social Connection and Isolation Panel [NHANES]    Frequency of Communication with Friends and  Family: More than three times a week    Frequency of Social Gatherings with Friends and Family: Once a week    Attends Religious Services: Never    Marine scientist or Organizations: No    Attends Archivist Meetings: Never    Marital Status: Divorced  Human resources officer Violence: Not At Risk (04/14/2021)   Humiliation, Afraid, Rape, and Kick questionnaire    Fear of Current or Ex-Partner: No    Emotionally Abused: No    Physically Abused: No    Sexually Abused: No    Outpatient Medications Prior to Visit  Medication Sig Dispense Refill   atorvastatin (LIPITOR) 10 MG tablet TAKE 1 TABLET BY MOUTH AT BEDTIME 90 tablet 0   atorvastatin (LIPITOR) 10 MG tablet Take 1 tablet (10 mg total) by mouth at bedtime. 90 tablet 0   Calcium Citrate 250 MG TABS Take 1 tablet (250 mg total) by mouth daily. 60 tablet 5   cetirizine (ZYRTEC ALLERGY) 10 MG tablet Take 1 tablet (10 mg total) by mouth at bedtime. 30 tablet 0   diphenhydrAMINE (BENADRYL ALLERGY) 25 MG tablet Take 1 tablet (25 mg total) by mouth every 8 (eight) hours as needed for itching (rash). 30 tablet 0   metFORMIN (GLUCOPHAGE) 500 MG tablet Take 1 tablet by mouth once daily with breakfast 90 tablet 0   metFORMIN (GLUCOPHAGE) 500 MG tablet Take 1 tablet (500 mg total) by mouth daily with breakfast. 90 tablet 0   predniSONE (DELTASONE) 20 MG tablet 3 tabs poqday 1-3, 2 tabs poqday 4-6, 1 tab poqday 7-9 18 tablet 0   Vitamin D, Cholecalciferol, 25 MCG (1000 UT) TABS Take 1 each by mouth daily. 60 tablet 5   No facility-administered medications prior to visit.    Allergies  Allergen Reactions   Oxycodone Other (See Comments)   Penicillin G Hives and Shortness Of Breath   Protonix [Pantoprazole Sodium] Shortness Of Breath   Penicillins    Prednisone Cough   Vitamin D Analogs     Chest pain   Xanax [Alprazolam]     hyperactivity   Fosamax [Alendronate Sodium] Nausea Only    ROS Review of Systems  Gastrointestinal:   Negative for nausea and vomiting.  All other systems reviewed and are negative.     Objective:    Physical Exam Constitutional:      Appearance: Normal appearance.  HENT:     Head: Normocephalic and atraumatic.  Eyes:     Conjunctiva/sclera: Conjunctivae normal.  Cardiovascular:     Rate and Rhythm: Normal rate and regular rhythm.  Pulmonary:     Effort: Pulmonary effort is normal.     Breath sounds: Normal breath sounds.  Musculoskeletal:     Right  lower leg: No edema.     Left lower leg: No edema.  Skin:    General: Skin is warm and dry.  Neurological:     General: No focal deficit present.     Mental Status: She is alert. Mental status is at baseline.  Psychiatric:        Mood and Affect: Mood normal.        Behavior: Behavior normal.     BP 116/78   Pulse 84   Temp 98 F (36.7 C)   Resp 16   Ht 5' 2"  (1.575 m)   Wt 128 lb 12.8 oz (58.4 kg)   SpO2 96%   BMI 23.56 kg/m  Wt Readings from Last 3 Encounters:  02/08/22 132 lb 8 oz (60.1 kg)  12/08/21 130 lb 8 oz (59.2 kg)  11/25/21 131 lb (59.4 kg)     Health Maintenance Due  Topic Date Due   COVID-19 Vaccine (5 - Moderna series) 12/24/2020   OPHTHALMOLOGY EXAM  05/15/2021   INFLUENZA VACCINE  03/02/2022   URINE MICROALBUMIN  03/03/2022   HEMOGLOBIN A1C  03/07/2022    There are no preventive care reminders to display for this patient.  No results found for: "TSH" Lab Results  Component Value Date   WBC 9.0 08/29/2020   HGB 12.6 08/29/2020   HCT 37.6 08/29/2020   MCV 92.8 08/29/2020   PLT 367 08/29/2020   Lab Results  Component Value Date   NA 143 03/03/2021   K 3.6 03/03/2021   CO2 31 03/03/2021   GLUCOSE 113 (H) 03/03/2021   BUN 6 (L) 03/03/2021   CREATININE 0.88 03/03/2021   BILITOT 0.5 03/03/2021   ALKPHOS 60 09/28/2016   AST 17 03/03/2021   ALT 12 03/03/2021   PROT 7.0 03/03/2021   ALBUMIN 3.9 09/28/2016   CALCIUM 9.9 03/03/2021   EGFR 68 03/03/2021   Lab Results  Component  Value Date   CHOL 163 09/07/2021   Lab Results  Component Value Date   HDL 72 09/07/2021   Lab Results  Component Value Date   LDLCALC 72 09/07/2021   Lab Results  Component Value Date   TRIG 107 09/07/2021   Lab Results  Component Value Date   CHOLHDL 2.3 09/07/2021   Lab Results  Component Value Date   HGBA1C 6.9 (H) 09/07/2021      Assessment & Plan:   1. Type 2 diabetes mellitus with hyperglycemia, without long-term current use of insulin ([place]): A1c in the office elevated today at 7.3%. Patient states she's been eating a lot of chocolate. Increase Metformin 500 mg to twice daily and cut back on sugar. Microalbumin today.   - POCT HgB A1C - metFORMIN (GLUCOPHAGE) 500 MG tablet; Take 1 tablet (500 mg total) by mouth 2 (two) times daily with a meal.  Dispense: 180 tablet; Refill: 1 - Urine Microalbumin w/creat. ratio  2. Dyslipidemia associated with type 2 diabetes mellitus ([place]): Continue Lipitor 10 mg daily.    Follow-up: Return in about 6 months (around 09/08/2022).    Teodora Medici, DO

## 2022-03-09 LAB — MICROALBUMIN / CREATININE URINE RATIO
Creatinine, Urine: 62 mg/dL (ref 20–275)
Microalb Creat Ratio: 3 mcg/mg creat (ref <30)
Microalb, Ur: 0.2 mg/dL

## 2022-03-19 ENCOUNTER — Ambulatory Visit
Admission: RE | Admit: 2022-03-19 | Discharge: 2022-03-19 | Disposition: A | Discharge status: Home / Self Care | Payer: Medicare HMO | Source: Ambulatory Visit | Attending: Family Medicine | Admitting: Family Medicine

## 2022-03-19 DIAGNOSIS — Z1231 Encounter for screening mammogram for malignant neoplasm of breast: Secondary | ICD-10-CM | POA: Diagnosis not present

## 2022-04-15 ENCOUNTER — Ambulatory Visit (INDEPENDENT_AMBULATORY_CARE_PROVIDER_SITE_OTHER): Payer: Medicare HMO

## 2022-04-15 VITALS — BP 122/72 | HR 70 | Temp 97.8°F | Resp 14 | Ht 64.0 in | Wt 126.5 lb

## 2022-04-15 DIAGNOSIS — Z0000 Encounter for general adult medical examination without abnormal findings: Secondary | ICD-10-CM

## 2022-04-15 NOTE — Progress Notes (Unsigned)
Subjective:   Kiara Brady is a 76 y.o. female who presents for Medicare Annual (Subsequent) preventive examination.  Review of Systems    Defer to PCP       Objective:    There were no vitals filed for this visit. There is no height or weight on file to calculate BMI.     04/14/2021    9:46 AM 03/13/2020   10:04 AM 08/24/2018   11:23 AM 08/01/2018    1:52 PM 05/25/2017    9:44 AM 05/17/2017    3:57 PM 04/11/2017    1:13 PM  Advanced Directives  Does Patient Have a Medical Advance Directive? No Yes No No No No No  Type of Furniture conservator/restorer;Living will       Copy of Healthcare Power of Attorney in Chart?  No - copy requested       Would patient like information on creating a medical advance directive? Yes (MAU/Ambulatory/Procedural Areas - Information given)   Yes (MAU/Ambulatory/Procedural Areas - Information given)       Current Medications (verified) Outpatient Encounter Medications as of 04/15/2022  Medication Sig   atorvastatin (LIPITOR) 10 MG tablet Take 1 tablet (10 mg total) by mouth at bedtime.   cetirizine (ZYRTEC ALLERGY) 10 MG tablet Take 1 tablet (10 mg total) by mouth at bedtime.   metFORMIN (GLUCOPHAGE) 500 MG tablet Take 1 tablet (500 mg total) by mouth 2 (two) times daily with a meal.   No facility-administered encounter medications on file as of 04/15/2022.    Allergies (verified) Oxycodone, Penicillin g, Protonix [pantoprazole sodium], Penicillins, Prednisone, Vitamin d analogs, Xanax [alprazolam], and Fosamax [alendronate sodium]   History: Past Medical History:  Diagnosis Date   Diabetes mellitus without complication (HCC)    Hyperlipidemia    Osteopenia of neck of femur 12/19/2017   Osteoporosis 02/06/2018   July 2019   Polio    [place]   Past Surgical History:  Procedure Laterality Date   VARICOSE VEIN SURGERY     Family History  Problem Relation Age of Onset   Heart disease  Mother    Alzheimer's disease Mother    Heart attack Mother    Alcohol abuse Father    Cancer Maternal Grandmother        breast   Breast cancer Maternal Grandmother    Diabetes Sister    Social History   Socioeconomic History   Marital status: Divorced    Spouse name: Not on file   Number of children: 2   Years of education: Not on file   Highest education level: Master's degree (e.g., MA, MS, MEng, MEd, MSW, MBA)  Occupational History   Not on file  Tobacco Use   Smoking status: Former   Smokeless tobacco: Never  Vaping Use   Vaping Use: Never used  Substance and Sexual Activity   Alcohol use: No   Drug use: No   Sexual activity: Not Currently  Other Topics Concern   Not on file  Social History Narrative   Pt lives alone, daughter is in another country and son lives in [place].    Originally from the [place] [facility] [place]   Financial Resource Strain: Low Risk  (04/14/2021)   Overall Financial Resource Strain (CARDIA)    Difficulty of Paying Living Expenses: Not hard at all  Food Insecurity: No Food Insecurity (04/14/2021)   Hunger Vital Sign  Worried About Programme researcher, broadcasting/film/video in the Last Year: Never true    Ran Out of Food in the Last Year: Never true  Transportation Needs: No Transportation Needs (04/14/2021)   PRAPARE - Administrator, Civil Service (Medical): No    Lack of Transportation (Non-Medical): No  Physical Activity: Inactive (04/14/2021)   Exercise Vital Sign    Days of Exercise per Week: 0 days    Minutes of Exercise per Session: 0 min  Stress: Stress Concern Present (04/14/2021)   [employer] of Occupational Health - Occupational Stress Questionnaire    Feeling of Stress : Rather much  Social Connections: Socially Isolated (04/14/2021)   Social Connection and Isolation Panel [NHANES]    Frequency of Communication with Friends and Family: More than three times a week    Frequency of Social Gatherings with  Friends and Family: Once a week    Attends Religious Services: Never    Database administrator or Organizations: No    Attends Banker Meetings: Never    Marital Status: Divorced    Tobacco Counseling Counseling given: Not Answered   Clinical Intake:                 Diabetic?No         Activities of Daily Living    03/08/2022    2:25 PM 02/08/2022   11:36 AM  In your present state of health, do you have any difficulty performing the following activities:  Hearing? 1 0  Vision? 0 0  Difficulty concentrating or making decisions? 1 0  Walking or climbing stairs? 1 0  Dressing or bathing? 0 0  Doing errands, shopping? 0 0    Patient Care Team: [name], PA-C as PCP - General (Family Medicine)  Indicate any recent Medical Services you may have received from other than [facility] providers in the past year (date may be approximate).     Assessment:   This is a routine wellness examination for Kiara Brady.  Hearing/Vision screen No results found.  Dietary issues and exercise activities discussed:     Goals Addressed   None   Depression Screen    03/08/2022    2:25 PM 02/08/2022   11:37 AM 12/08/2021   11:22 AM 11/25/2021   11:20 AM 09/07/2021    3:20 PM 07/30/2021   11:41 AM 05/18/2021   10:48 AM  PHQ 2/9 Scores  PHQ - 2 Score 0 0 2 0 0 0 0  PHQ- 9 Score 0 0 6 0 0 0     Fall Risk    03/08/2022    2:17 PM 02/08/2022   11:36 AM 12/08/2021   11:00 AM 11/25/2021   11:20 AM 09/07/2021    3:12 PM  Fall Risk   Falls in the past year? 1 0 0 0 0  Number falls in past yr: 1 0  0 0  Injury with Fall? 0 0  0 0  Risk for fall due to : Impaired balance/gait;Impaired mobility No Fall Risks No Fall Risks No Fall Risks   Follow up  Falls prevention discussed;Education provided Falls prevention discussed Falls prevention discussed     FALL RISK PREVENTION PERTAINING TO THE HOME:  Any stairs in or around the home? Yes  If so, are there any without handrails?  No  Home free of loose throw rugs in walkways, pet beds, electrical cords, etc? Yes  Adequate lighting in your home to reduce risk of falls? Yes  ASSISTIVE DEVICES UTILIZED TO PREVENT FALLS:  Life alert? No  Use of a cane, walker or w/c? No  Grab bars in the bathroom? Yes  Shower chair or bench in shower? Yes  Elevated toilet seat or a handicapped toilet? Yes   TIMED UP AND GO:  Was the test performed? Yes .  Length of time to ambulate 10 feet: 5 sec.   Gait slow and steady without use of assistive device  Cognitive Function:    09/26/2020    1:05 PM  MMSE - Mini Mental State Exam  Orientation to time 5  Orientation to Place 5  Registration 3  Attention/ Calculation 5  Recall 3  Language- name 2 objects 2  Language- repeat 1  Language- follow 3 step command 3  Language- read & follow direction 1  Write a sentence 1  Copy design 1  Total score 30        08/01/2018    2:18 PM 01/10/2017    3:31 PM  6CIT Screen  What Year? 0 points 0 points  What month? 0 points 0 points  What time? 0 points 0 points  Count back from 20 0 points 0 points  Months in reverse 0 points 0 points  Repeat phrase 0 points 0 points  Total Score 0 points 0 points    Immunizations Immunization History  Administered Date(s) Administered   Fluad Quad(high Dose 65+) 04/14/2021   Influenza, High Dose Seasonal PF 06/09/2018, 04/17/2019   Influenza-Unspecified 05/06/2017, 06/09/2018, 03/02/2020   Moderna Sars-Covid-2 Vaccination 09/14/2019, 10/12/2019, 05/22/2020, 10/29/2020   Pneumococcal Conjugate-13 06/05/2016, 07/01/2017   Pneumococcal Polysaccharide-23 05/10/2019   Tdap 08/24/2018   Zoster Recombinat (Shingrix) 09/25/2021, 11/23/2021    TDAP status: Up to date  Flu Vaccine status: Completed at today's visit  Pneumococcal vaccine status: Up to date  Covid-19 vaccine status: Completed vaccines  Qualifies for Shingles Vaccine? No   Zostavax completed No   Shingrix Completed?:  Yes  Screening Tests Health Maintenance  Topic Date Due   COVID-19 Vaccine (5 - Moderna series) 12/24/2020   OPHTHALMOLOGY EXAM  05/15/2021   INFLUENZA VACCINE  03/02/2022   Diabetic kidney evaluation - GFR measurement  03/03/2022   FOOT EXAM  09/07/2022   HEMOGLOBIN A1C  09/08/2022   Diabetic kidney evaluation - Urine ACR  03/09/2023   MAMMOGRAM  03/20/2023   DEXA SCAN  04/28/2023   TETANUS/TDAP  08/24/2028   Pneumonia Vaccine 40+ Years old  Completed   Hepatitis C Screening  Completed   Zoster Vaccines- Shingrix  Completed   HPV VACCINES  Aged Out   COLONOSCOPY (Pts 45-90yrs Insurance coverage will need to be confirmed)  Discontinued    Health Maintenance  Health Maintenance Due  Topic Date Due   COVID-19 Vaccine (5 - Moderna series) 12/24/2020   OPHTHALMOLOGY EXAM  05/15/2021   INFLUENZA VACCINE  03/02/2022   Diabetic kidney evaluation - GFR measurement  03/03/2022    Colorectal cancer screening: Type of screening: Colonoscopy. Completed 06/04/2011. Repeat every d/c'd years  Mammogram status: Completed 03/19/2022. Repeat every year  Bone Density status: Completed 04/27/2021. Results reflect: Bone density results: OSTEOPENIA. Repeat every 2 years.  Lung Cancer Screening: (Low Dose CT Chest recommended if Age 1-80 years, 30 pack-year currently smoking OR have quit w/in 15years.) does not qualify.   Lung Cancer Screening Referral: N/A  Additional Screening:  Hepatitis C Screening: does not qualify; Completed 02/28/2019  Vision Screening: Recommended annual ophthalmology exams for early detection of glaucoma and other  disorders of the eye. Is the patient up to date with their annual eye exam?  Yes  Who is the provider or what is the name of the office in which the patient attends annual eye exams? [facility] If pt is not established with a provider, would they like to be referred to a provider to establish care? No .   Dental Screening: Recommended annual  dental exams for proper oral hygiene  Community Resource Referral / Chronic Care Management: CRR required this visit?  No   CCM required this visit?  No      Plan:     I have personally reviewed and noted the following in the patient's chart:   Medical and social history Use of alcohol, tobacco or illicit drugs  Current medications and supplements including opioid prescriptions. Patient is not currently taking opioid prescriptions. Functional ability and status Nutritional status Physical activity Advanced directives List of other physicians Hospitalizations, surgeries, and ER visits in previous 12 months Vitals Screenings to include cognitive, depression, and falls Referrals and appointments  In addition, I have reviewed and discussed with patient certain preventive protocols, quality metrics, and best practice recommendations. A written personalized care plan for preventive services as well as general preventive health recommendations were provided to patient.     Benay Pike, CMA   04/15/2022   Nurse Notes:   Kiara Brady , Thank you for taking time to come for your Medicare Wellness Visit. I appreciate your ongoing commitment to your health goals. Please review the following plan we discussed and let me know if I can assist you in the future.   These are the goals we discussed:  Goals      Exercise 3x per week (30 min per time)     Recommend walking 3 days a week for 20-30 minutes.        This is a list of the screening recommended for you and due dates:  Health Maintenance  Topic Date Due   COVID-19 Vaccine (5 - Moderna series) 12/24/2020   Eye exam for diabetics  05/15/2021   Flu Shot  03/02/2022   Yearly kidney function blood test for diabetes  03/03/2022   Complete foot exam   09/07/2022   Hemoglobin A1C  09/08/2022   Yearly kidney health urinalysis for diabetes  03/09/2023   Mammogram  03/20/2023   DEXA scan (bone density measurement)  04/28/2023    Tetanus Vaccine  08/24/2028   Pneumonia Vaccine  Completed   Hepatitis C Screening: USPSTF Recommendation to screen - Ages 57-79 yo.  Completed   Zoster (Shingles) Vaccine  Completed   HPV Vaccine  Aged Out   Colon Cancer Screening  Discontinued

## 2022-04-28 ENCOUNTER — Telehealth: Payer: Self-pay | Admitting: Family Medicine

## 2022-04-28 ENCOUNTER — Other Ambulatory Visit: Payer: Self-pay | Admitting: Family Medicine

## 2022-04-28 DIAGNOSIS — E119 Type 2 diabetes mellitus without complications: Secondary | ICD-10-CM

## 2022-04-28 DIAGNOSIS — E1169 Type 2 diabetes mellitus with other specified complication: Secondary | ICD-10-CM

## 2022-04-28 DIAGNOSIS — F419 Anxiety disorder, unspecified: Secondary | ICD-10-CM

## 2022-04-28 MED ORDER — ATORVASTATIN CALCIUM 10 MG PO TABS: 10.0000 mg | ORAL_TABLET | Freq: Every day | ORAL | 0 refills | Status: DC

## 2022-04-28 NOTE — Telephone Encounter (Signed)
Requested Prescriptions  Pending Prescriptions Disp Refills  . atorvastatin (LIPITOR) 10 MG tablet 90 tablet 0    Sig: Take 1 tablet (10 mg total) by mouth at bedtime.     Cardiovascular:  Antilipid - Statins Failed - 04/28/2022 11:05 AM      Failed - Lipid Panel in normal range within the last 12 months    Cholesterol, Total  Date Value Ref Range Status  09/08/2015 166 100 - 199 mg/dL Final   Cholesterol  Date Value Ref Range Status  09/07/2021 163 <200 mg/dL Final   LDL Cholesterol (Calc)  Date Value Ref Range Status  09/07/2021 72 mg/dL (calc) Final    Comment:    Reference range: <100 . Desirable range <100 mg/dL for primary prevention;   <70 mg/dL for patients with CHD or diabetic patients  with > or = 2 CHD risk factors. Marland Kitchen LDL-C is now calculated using the Martin-Hopkins  calculation, which is a validated novel method providing  better accuracy than the Friedewald equation in the  estimation of LDL-C.  Cresenciano Genre et al. Annamaria Helling. 7371;062(69): 2061-2068  (http://education.[facility].com/faq/FAQ164)    HDL  Date Value Ref Range Status  09/07/2021 72 > OR = 50 mg/dL Final  09/08/2015 68 >39 mg/dL Final   Triglycerides  Date Value Ref Range Status  09/07/2021 107 <150 mg/dL Final         Passed - Patient is not pregnant      Passed - Valid encounter within last 12 months    Recent Outpatient Visits          1 month ago Type 2 diabetes mellitus with hyperglycemia, without long-term current use of insulin [facility])   [place], DO   2 months ago Rash and nonspecific skin eruption   [facility] [place], Kristeen Miss, PA-C   4 months ago Chest wall pain   [facility], Kristeen Miss, PA-C   5 months ago Acute chest wall pain   [facility] M[place]um, [place], PA-C   7 months ago Type 2 diabetes mellitus with hyperglycemia, without long-term current use of insulin [facility])   [facility] Teodora Medici, DO      Future Appointments            In 4 months Teodora Medici, [facility], [facility]

## 2022-04-28 NOTE — Telephone Encounter (Signed)
Medication Refill - Medication: atorvastatin (LIPITOR) 10 MG tablet  Has the patient contacted their pharmacy? Yes.   (Agent: If no, request that the patient contact the pharmacy for the refill. If patient does not wish to contact the pharmacy document the reason why and proceed with request.) (Agent: If yes, when and what did the pharmacy advise?) call dr no refills  Preferred Pharmacy (with phone number or street name):  [place], [place] [address] [place] [address]  Phone: [phone] Fax: [phone]  Hours: Not open 24 hours   Has the patient been seen for an appointment in the last year OR does the patient have an upcoming appointment? Yes.   9/14  Agent: Please be advised that RX refills may take up to 3 business days. We ask that you follow-up with your pharmacy.

## 2022-04-28 NOTE — Telephone Encounter (Signed)
She will need a referral for pysch

## 2022-04-28 NOTE — Telephone Encounter (Signed)
Copied from [place] [id number]. Topic: General - Inquiry >> Apr 28, 2022 10:28 AM Leilani Able wrote: Pt has called and states that she is getting old and is fearful of getting out, her neighbors do not like her and she has no family close by the only comfort she has is her little dog. She wants to see if she can get an ESA certificate for her dog to be an emotional support dog. She says she is a very good protector and would like to take the dog everywhere she goes as it would give her great comfort. States needs to come from PCP, will someone please contact her on how to start this process. FU to advise [phone] >> Apr 28, 2022 10:45 AM Bethanne Ginger wrote: This was sent to our office in error.

## 2022-04-28 NOTE — Telephone Encounter (Signed)
Referral has been placed. 

## 2022-04-30 NOTE — Telephone Encounter (Signed)
Pt is requesting call back from clinic, wants information regarding this referral mentioned below

## 2022-04-30 NOTE — Telephone Encounter (Signed)
Called pt no answer left vm to return my call. 

## 2022-05-10 ENCOUNTER — Ambulatory Visit: Payer: Self-pay | Admitting: *Deleted

## 2022-05-10 NOTE — Telephone Encounter (Signed)
Pt states stopped taking metformin BID due to feeling abdominal pain and would get diarrhea.  She states she went back onto taking 1 daily and does not get those sx. Please advice

## 2022-05-10 NOTE — Telephone Encounter (Signed)
No answer from pt left vm to return call. 

## 2022-05-10 NOTE — Telephone Encounter (Signed)
Third call with voicemail left. Have given her number to call back to be put through to a nurse. Will forward on to the office.

## 2022-05-10 NOTE — Telephone Encounter (Signed)
2nd attempt no answer, I also called daughter on her file left vm to get her mom to call us.

## 2022-05-10 NOTE — Telephone Encounter (Signed)
Summary: Medication Managment / not currently experiencing abdominal pain.   Patient states not sure what diabetic medication is causing abdominal pain, diarrhea and dizziness and she thinks its metformin.   Patient states Dr. Rosana Berger stated she should be taking 2 metformins but patient and pharmacy is unclear.    Also pharmacy needs clarity if patient should be taking 2 metformins.   Patient is currently not experiencing abdominal pain.     Attempted to call patient - left message to call office

## 2022-05-10 NOTE — Telephone Encounter (Signed)
Message left with call back number.

## 2022-05-11 ENCOUNTER — Other Ambulatory Visit: Payer: Self-pay | Admitting: Internal Medicine

## 2022-05-11 DIAGNOSIS — E1165 Type 2 diabetes mellitus with hyperglycemia: Secondary | ICD-10-CM

## 2022-05-11 MED ORDER — METFORMIN HCL ER 500 MG PO TB24: 500.0000 mg | ORAL_TABLET | Freq: Two times a day (BID) | ORAL | 1 refills | Status: DC

## 2022-05-11 NOTE — Telephone Encounter (Signed)
Pt has been informed verbalized understanding.

## 2022-05-11 NOTE — Telephone Encounter (Signed)
Called pt no answer left vm 

## 2022-05-11 NOTE — Telephone Encounter (Signed)
2nd attempt - no answer

## 2022-05-17 ENCOUNTER — Ambulatory Visit: Payer: Self-pay | Admitting: *Deleted

## 2022-05-17 NOTE — Telephone Encounter (Signed)
Reason for Disposition  [1] MODERATE dizziness (e.g., interferes with normal activities) AND [2] has NOT been evaluated by doctor (or NP/PA) for this  (Exception: Dizziness caused by heat exposure, sudden standing, or poor fluid intake.)  Answer Assessment - Initial Assessment Questions 1. DESCRIPTION: "Describe your dizziness."     My A1C is high.   My metformin was increased/doubled.   I'm having diarrhea, stomachache.  She changed it to the ER form.     Now I'm having muscle spasms all over my body.  I got dizzy in the bathroom and hit my head.   I don't remember anything.   This happened last weekend. If I can move my head I'm ok.    Headaches and my body aches all over.    My muscles are hurting.    I stretch and walk my dog in the park.   I'm doing better.  I take the metformin 1 hour apart.    I eat before I take them.    Now I'm feeling dizzy today.   It happened after taking the 2nd metformin ER.   2. LIGHTHEADED: "Do you feel lightheaded?" (e.g., somewhat faint, woozy, weak upon standing)     I fell in the bathroom and hit my head last weekend.    "I'm afraid to go outside".     3. VERTIGO: "Do you feel like either you or the room is spinning or tilting?" (i.e. vertigo)     no 4. SEVERITY: "How bad is it?"  "Do you feel like you are going to faint?" "Can you stand and walk?"   - MILD: Feels slightly dizzy, but walking normally.   - MODERATE: Feels unsteady when walking, but not falling; interferes with normal activities (e.g., school, work).   - SEVERE: Unable to walk without falling, or requires assistance to walk without falling; feels like passing out now.      Not dizzy today. 5. ONSET:  "When did the dizziness begin?"     Dizziness started today.  6. AGGRAVATING FACTORS: "Does anything make it worse?" (e.g., standing, change in head position)     I think it's the metformin 7. HEART RATE: "Can you tell me your heart rate?" "How many beats in 15 seconds?"  (Note: not all  patients can do this)       Not asked 8. CAUSE: "What do you think is causing the dizziness?"     The metformin 9. RECURRENT SYMPTOM: "Have you had dizziness before?" If Yes, ask: "When was the last time?" "What happened that time?"     No 10. OTHER SYMPTOMS: "Do you have any other symptoms?" (e.g., fever, chest pain, vomiting, diarrhea, bleeding)       Dizzy, hurting all over her body.    11. PREGNANCY: "Is there any chance you are pregnant?" "When was your last menstrual period?"       N/A  Protocols used: Dizziness - Lightheadedness-A-AH

## 2022-05-17 NOTE — Telephone Encounter (Signed)
  Chief Complaint: dizziness, last weekend fainted in the bathroom and fell hitting her head.   Did not get medical attention. Symptoms: Dizziness that started today.   She mentioned her metformin dose was increased. Frequency: She said the dizziness started today. Pertinent Negatives: Patient denies injuring her head during the fall.   Did c/o her shoulder hurting at the end of the conversation. Disposition: [] ED /[] Urgent Care (no appt availability in office) / [x] Appointment(In office/virtual)/ []  [facility]/ [] Home Care/ [] Refused Recommended Disposition /[] [facility]/ []  Follow-up with PCP Additional Notes: Appt made with Delsa Grana, PA-C for 05/18/2022 at 11:20 with instructions to call 911 if she gets dizzy or faints again.

## 2022-05-18 ENCOUNTER — Encounter: Payer: Self-pay | Admitting: Family Medicine

## 2022-05-18 ENCOUNTER — Ambulatory Visit (INDEPENDENT_AMBULATORY_CARE_PROVIDER_SITE_OTHER): Payer: Medicare HMO | Admitting: Family Medicine

## 2022-05-18 VITALS — BP 132/78 | HR 81 | Temp 98.0°F | Resp 16 | Ht 62.0 in | Wt 125.8 lb

## 2022-05-18 DIAGNOSIS — E1165 Type 2 diabetes mellitus with hyperglycemia: Secondary | ICD-10-CM | POA: Diagnosis not present

## 2022-05-18 DIAGNOSIS — M62838 Other muscle spasm: Secondary | ICD-10-CM

## 2022-05-18 DIAGNOSIS — K521 Toxic gastroenteritis and colitis: Secondary | ICD-10-CM | POA: Diagnosis not present

## 2022-05-18 DIAGNOSIS — M791 Myalgia, unspecified site: Secondary | ICD-10-CM | POA: Diagnosis not present

## 2022-05-18 DIAGNOSIS — R350 Frequency of micturition: Secondary | ICD-10-CM

## 2022-05-18 MED ORDER — METFORMIN HCL ER 500 MG PO TB24: 500.0000 mg | ORAL_TABLET | Freq: Every day | ORAL | 1 refills | Status: DC

## 2022-05-18 NOTE — Patient Instructions (Signed)
I would continue your atorvastatin same dose in the evenings  Reduce your metformin XR to only one dose a day with food until we get your labs and call you.  I would make sure some of you fluids you drink each day have some electrolytes in them and not drink gallons of water only when you have diarrhea.  Try to drink clear fluids without a lot of sugar.  Ask your pharmacist for electrolyte replacement packets that they like.

## 2022-05-18 NOTE — Progress Notes (Signed)
Patient ID: Kiara Brady, female    DOB: September 29, 1945, 76 y.o.   MRN: [id number]  PCP: Kiara Berry, PA-C  Chief Complaint  Patient presents with   Follow-up   Diabetes    Pt does not want to take metformin as has been prescribed by Dr.Andrews since she states she feels her body all achy    Subjective:   Kiara Brady is a 76 y.o. female, presents to clinic with CC of the following:  HPI  Pt presents for medicine side effects Metformin 500 mg was increased to BID about 2 months ago after A1C was not at goal Lab Results  Component Value Date   HGBA1C 7.3 (A) 03/08/2022   She had stomach ache, diarrhea after dose increase, so PCP changed to metformin XR 500 mg BID  and since then has had some muscle cramps and she recently developed abd pain, urinary frequency, myalgias Last night she peed all night, has been drinking a lot of water after having diarrhea She has been on atorvastatin for years Currently she does not have abd pain but when she did she points to her left lower abdomen. Her last dose of metformin she took last night     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   Osteoarthritis of knees, 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   Hyperlipidemia 03/23/2016   Cardiac murmur, previously undiagnosed 01/23/2016   Heart murmur 09/03/2015   Chronic pain of both knees 06/30/2015   Dyslipidemia associated with type 2 diabetes mellitus (HCC) 02/26/2015   Diabetes mellitus, type 2 (HCC) 01/06/2015      Current Outpatient Medications:    atorvastatin (LIPITOR) 10 MG tablet, Take 1 tablet (10 mg total) by mouth at bedtime., Disp: 90 tablet, Rfl: 0   cetirizine (ZYRTEC ALLERGY) 10 MG tablet, Take 1 tablet (10 mg total) by mouth at bedtime., Disp: 30 tablet, Rfl: 0   metFORMIN (GLUCOPHAGE-XR) 500 MG 24 hr tablet, Take 1 tablet (500 mg total) by  mouth 2 (two) times daily with a meal., Disp: 180 tablet, Rfl: 1   Allergies  Allergen Reactions   Oxycodone Other (See Comments)   Penicillin G Hives and Shortness Of Breath   Protonix [Pantoprazole Sodium] Shortness Of Breath   Other Other (See Comments)   Penicillins    Prednisone Cough   Vitamin D Analogs     Chest pain   Xanax [Alprazolam]     hyperactivity   Fosamax [Alendronate Sodium] Nausea Only     Social History   Tobacco Use   Smoking status: Former   Smokeless tobacco: Never  Building services engineer Use: Never used  Substance Use Topics   Alcohol use: No   Drug use: No      Chart Review Today: I personally reviewed active problem list, medication list, allergies, family history, social history, health maintenance, notes from last encounter, lab results, imaging with the patient/caregiver today.   Review of Systems  Constitutional: Negative.   HENT: Negative.    Eyes: Negative.   Respiratory: Negative.    Cardiovascular: Negative.   Gastrointestinal: Negative.   Endocrine: Negative.   Genitourinary: Negative.   Musculoskeletal: Negative.   Skin: Negative.   Allergic/Immunologic: Negative.   Neurological: Negative.   Hematological: Negative.   Psychiatric/Behavioral: Negative.    All other systems reviewed and are negative.  Objective:   Vitals:   05/18/22 1130  BP: 132/78  Pulse: 81  Resp: 16  Temp: 98 F (36.7 C)  TempSrc: Oral  SpO2: 97%  Weight: 125 lb 12.8 oz (57.1 kg)  Height: 5\' 2"  (1.575 m)    Body mass index is 23.01 kg/m.  Physical Exam Vitals and nursing note reviewed.  Constitutional:      General: She is not in acute distress.    Appearance: Normal appearance. She is not ill-appearing, toxic-appearing or diaphoretic.  HENT:     Head: Normocephalic and atraumatic.     Right Ear: External ear normal.     Left Ear: External ear normal.     Nose: Nose normal.     Mouth/Throat:     Mouth: Mucous membranes are moist.   Eyes:     General: No scleral icterus.       Right eye: No discharge.        Left eye: No discharge.     Conjunctiva/sclera: Conjunctivae normal.  Cardiovascular:     Rate and Rhythm: Normal rate and regular rhythm.     Pulses: Normal pulses.     Heart sounds: Normal heart sounds. No murmur heard.    No friction rub. No gallop.  Pulmonary:     Effort: Pulmonary effort is normal. No respiratory distress.     Breath sounds: Normal breath sounds. No stridor. No wheezing, rhonchi or rales.  Abdominal:     General: Bowel sounds are normal. There is no distension.     Palpations: Abdomen is soft.     Tenderness: There is abdominal tenderness. There is rebound. There is no right CVA tenderness, left CVA tenderness or guarding.     Comments: Pt giggled with palpation of abd  Musculoskeletal:        General: No tenderness.     Right lower leg: No edema.     Left lower leg: No edema.  Skin:    Coloration: Skin is not jaundiced or pale.     Findings: No erythema or lesion.  Neurological:     Mental Status: She is alert. Mental status is at baseline.     Gait: Gait abnormal.  Psychiatric:        Attention and Perception: She is inattentive.        Mood and Affect: Affect normal. Mood is anxious.        Speech: Speech normal.        Behavior: Behavior is cooperative.      Results for orders placed or performed in visit on 03/09/22  HM DIABETES EYE EXAM  Result Value Ref Range   HM Diabetic Eye Exam No Retinopathy No Retinopathy       Assessment & Plan:   Patient presents with concerns of medication side effects She has been taking metformin, extended release metformin and atorvastatin -all of which she has been prescribed and been taking for many years About 2 months ago her PCP increased her metformin dose from [phone] total daily and she had stomach upset and diarrhea She complains about multiple symptoms ever since then I encouraged her to decrease the extended release  metformin dose down to 500 mg only once a day with food and continue to take atorvastatin while we check her labs  Would like to ensure that there is no sign of electrolyte abnormality, will recheck her A1c, CK to rule out any statin muscle injury - which I doubt -I palpated multiple muscle compartments today  and her back she had no tenderness anywhere  She complains of abdominal pain and urinary frequency but had an unremarkable abdominal exam  1. Type 2 diabetes mellitus with hyperglycemia, without long-term current use of insulin (HCC) Recheck A1C after 3 months of increased metformin dose, she may only tolerate 500 mg daily - metFORMIN (GLUCOPHAGE-XR) 500 MG 24 hr tablet; Take 1 tablet (500 mg total) by mouth daily with breakfast.  Dispense: 180 tablet; Refill: 1 - COMPLETE METABOLIC PANEL WITH GFR - Hemoglobin A1c  2. Diarrhea due to drug Largely resolved, rule out electrolyte abnormality - COMPLETE METABOLIC PANEL WITH GFR - Magnesium  3. Muscle spasm Complains of spasms cramping and pain - COMPLETE METABOLIC PANEL WITH GFR - Magnesium - CK (Creatine Kinase)  4. Myalgia See above - COMPLETE METABOLIC PANEL WITH GFR - Magnesium - CK (Creatine Kinase)  5. Urinary frequency Likely due to how much she is drinking, r/o injection or other causes - COMPLETE METABOLIC PANEL WITH GFR - Hemoglobin A1c - Urinalysis, Routine w reflex microscopic      Kiara Berry, PA-C 05/18/22 11:54 AM

## 2022-05-19 LAB — HEMOGLOBIN A1C
Hgb A1c MFr Bld: 7.2 % of total Hgb — ABNORMAL HIGH (ref <5.7)
Mean Plasma Glucose: 160 mg/dL
eAG (mmol/L): 8.9 mmol/L

## 2022-05-19 LAB — COMPLETE METABOLIC PANEL WITH GFR
AG Ratio: 1.6 (calc) (ref 1.0–2.5)
ALT: 10 U/L (ref 6–29)
AST: 18 U/L (ref 10–35)
Albumin: 4.5 g/dL (ref 3.6–5.1)
Alkaline phosphatase (APISO): 77 U/L (ref 37–153)
BUN: 7 mg/dL (ref 7–25)
CO2: 28 mmol/L (ref 20–32)
Calcium: 9.5 mg/dL (ref 8.6–10.4)
Chloride: 102 mmol/L (ref 98–110)
Creat: 0.97 mg/dL (ref 0.60–1.00)
Globulin: 2.9 g/dL (calc) (ref 1.9–3.7)
Glucose, Bld: 105 mg/dL — ABNORMAL HIGH (ref 65–99)
Potassium: 3.9 mmol/L (ref 3.5–5.3)
Sodium: 140 mmol/L (ref 135–146)
Total Bilirubin: 0.9 mg/dL (ref 0.2–1.2)
Total Protein: 7.4 g/dL (ref 6.1–8.1)
eGFR: 60 mL/min/{1.73_m2} (ref >=60)

## 2022-05-19 LAB — URINALYSIS, ROUTINE W REFLEX MICROSCOPIC
Bilirubin Urine: NEGATIVE
Glucose, UA: NEGATIVE
Hgb urine dipstick: NEGATIVE
Ketones, ur: NEGATIVE
Leukocytes,Ua: NEGATIVE
Nitrite: NEGATIVE
Protein, ur: NEGATIVE
Specific Gravity, Urine: 1.003 (ref 1.001–1.035)
pH: 5.5 (ref 5.0–8.0)

## 2022-05-19 LAB — MAGNESIUM: Magnesium: 2.1 mg/dL (ref 1.5–2.5)

## 2022-05-19 LAB — CK: Total CK: 89 U/L (ref 29–143)

## 2022-06-04 ENCOUNTER — Telehealth: Payer: Self-pay | Admitting: *Deleted

## 2022-06-04 ENCOUNTER — Ambulatory Visit (INDEPENDENT_AMBULATORY_CARE_PROVIDER_SITE_OTHER): Payer: Medicare HMO | Admitting: Internal Medicine

## 2022-06-04 ENCOUNTER — Ambulatory Visit
Admission: RE | Admit: 2022-06-04 | Discharge: 2022-06-04 | Disposition: A | Discharge status: Home / Self Care | Payer: Medicare HMO | Source: Ambulatory Visit | Attending: Internal Medicine | Admitting: Internal Medicine

## 2022-06-04 VITALS — BP 140/82 | HR 68 | Temp 97.9°F | Resp 16 | Ht 62.0 in | Wt 119.8 lb

## 2022-06-04 DIAGNOSIS — S0990XA Unspecified injury of head, initial encounter: Secondary | ICD-10-CM

## 2022-06-04 NOTE — Progress Notes (Signed)
Acute Office Visit  Subjective:     Patient ID: Kiara Brady, female    DOB: 1945/09/21, 76 y.o.   MRN: [id number]  Chief Complaint  Patient presents with   Head Injury    Hit head with clock on today, 06/04/22.    Head Injury  Associated symptoms include headaches and weakness. Pertinent negatives include no blurred vision.   Patient is in today for laceration on head. She states she was leaning forward to get her clock off the wall so she could change the batteries and it fell and hit her on the top of the head.  The clock supposedly hit so hard that it shattered.  She denies loss of consciousness but states she feels weak and unsteady on her feet since it happened.  She immediately started bleeding and got blood all over her hands and the floor.  She soaked through a washcloth with blood.  She did have mild pain but this is better now.  She drove herself here to be evaluated.  She states that she feels "sleepy".  She denies any changes in vision but does have mild head pain.  She is not on a blood thinner.  Review of Systems  Constitutional:  Negative for chills and fever.  Eyes:  Negative for blurred vision.  Respiratory:  Negative for shortness of breath.   Cardiovascular:  Negative for chest pain.  Musculoskeletal:  Negative for neck pain.  Neurological:  Positive for weakness and headaches. Negative for loss of consciousness.       Objective:    BP (!) 140/82 (BP Location: Right Arm, Patient Position: Sitting)   Pulse 68   Temp 97.9 F (36.6 C) (Oral)   Resp 16   Ht 5\' 2"  (1.575 m)   Wt 119 lb 12.8 oz (54.3 kg)   SpO2 98%   BMI 21.91 kg/m  BP Readings from Last 3 Encounters:  06/04/22 (!) 140/82  05/18/22 132/78  04/15/22 122/72   Wt Readings from Last 3 Encounters:  06/04/22 119 lb 12.8 oz (54.3 kg)  05/18/22 125 lb 12.8 oz (57.1 kg)  04/15/22 126 lb 8 oz (57.4 kg)      Physical Exam Constitutional:      Appearance: Normal appearance.  HENT:      Head: Normocephalic.     Comments: Small abrasion on the superior aspect of her scalp, bleeding resolved.  Eyes:     Extraocular Movements: Extraocular movements intact.     Conjunctiva/sclera: Conjunctivae normal.     Pupils: Pupils are equal, round, and reactive to light.     Comments: Dilated pupils bilaterally, reacting to light   Cardiovascular:     Rate and Rhythm: Normal rate and regular rhythm.  Pulmonary:     Effort: Pulmonary effort is normal.     Breath sounds: Normal breath sounds.  Skin:    General: Skin is warm and dry.     Findings: Lesion present.     Comments: Quarter sized abrasion on scalp  Neurological:     General: No focal deficit present.     Mental Status: She is alert and oriented to person, place, and time. Mental status is at baseline.     Cranial Nerves: No cranial nerve deficit.     Sensory: No sensory deficit.     Motor: No weakness.     Coordination: Coordination normal.     Gait: Gait abnormal.     Deep Tendon Reflexes: Reflexes normal.  Psychiatric:  Mood and Affect: Mood normal.        Behavior: Behavior normal.     No results found for any visits on 06/04/22.      Assessment & Plan:   1. Traumatic injury of head, initial encounter: Patient with head trauma that occurred about 1 hour ago, not on any blood thinners, neurologic exam reassuring however patient feels "sleepy" and weak. Recommend presenting to ER which the patient refuses. Send for stat head CT, CBC and CMP. Discussed that if her symptoms worsen over the weekend she needs to be evaluated in the ER. She agrees. Follow up here in 1 week for recheck, consider concussion.   - CBC w/Diff/Platelet - COMPLETE METABOLIC PANEL WITH GFR - CT HEAD WO CONTRAST ( ); Future\  Return in about 1 week (around 06/11/2022).  Margarita Mail, DO

## 2022-06-04 NOTE — Telephone Encounter (Signed)
Called pt no answer, I see Kiara Brady tried to contact her and no answer as well left VM to return call.

## 2022-06-04 NOTE — Telephone Encounter (Signed)
Kiara Brady calling to report HCT "Negative. Results available in EPIC. Impression: "No acute intracranial abnormality seen.

## 2022-06-04 NOTE — Patient Instructions (Signed)
It was great seeing you today!  Plan discussed at today's visit: -Blood work ordered today, results will be uploaded to [facility].  -Stat head CT today  Follow up in: 1 week  Take care and let us know if you have any questions or concerns prior to your next visit.  Dr. Rosana Berger

## 2022-06-04 NOTE — Telephone Encounter (Signed)
Kiara Brady with [facility] calling to report HCT "Negative."  Results are available in Epic.   Impression: "No acute intracranial abnormality seen."

## 2022-07-02 ENCOUNTER — Ambulatory Visit: Payer: Medicare HMO | Admitting: Family Medicine

## 2022-09-09 ENCOUNTER — Ambulatory Visit: Payer: Medicare HMO | Admitting: Internal Medicine

## 2022-09-10 ENCOUNTER — Ambulatory Visit: Payer: Self-pay | Admitting: *Deleted

## 2022-09-10 NOTE — Telephone Encounter (Signed)
Reason for Disposition  [1] Patient says chest pain feels exactly the same as previously diagnosed "heartburn" AND [2] describes burning in chest AND [3] accompanying sour taste in mouth  Answer Assessment - Initial Assessment Questions 1. LOCATION: "Where does it hurt?"       Midline chest and stomach 2. RADIATION: "Does the pain go anywhere else?" (e.g., into neck, jaw, arms, back)     Into throat 3. ONSET: "When did the chest pain begin?" (Minutes, hours or days)      Earlier today 4. PATTERN: "Does the pain come and go, or has it been constant since it started?"  "Does it get worse with exertion?"      constant 5. DURATION: "How long does it last" (e.g., seconds, minutes, hours)      6. SEVERITY: "How bad is the pain?"  (e.g., Scale 1-10; mild, moderate, or severe)    - MILD (1-3): doesn't interfere with normal activities     - MODERATE (4-7): interferes with normal activities or awakens from sleep    - SEVERE (8-10): excruciating pain, unable to do any normal activities       severe 7. CARDIAC RISK FACTORS: "Do you have any history of heart problems or risk factors for heart disease?" (e.g., angina, prior heart attack; diabetes, high blood pressure, high cholesterol, smoker, or strong family history of heart disease)     yes 8. PULMONARY RISK FACTORS: "Do you have any history of lung disease?"  (e.g., blood clots in lung, asthma, emphysema, birth control pills)     no 9. CAUSE: "What do you think is causing the chest pain?"     Gas-reflux 10. OTHER SYMPTOMS: "Do you have any other symptoms?" (e.g., dizziness, nausea, vomiting, sweating, fever, difficulty breathing, cough)       Nausea, constipation  Protocols used: Chest Pain-A-AH

## 2022-09-10 NOTE — Telephone Encounter (Signed)
  Chief Complaint: heartburn- chest pain Symptoms: midline chest pain- reflux in mouth- sour taste- severe Frequency: started today Pertinent Negatives: Patient denies dizziness, nausea, vomiting, sweating, fever, difficulty breathing, cough Disposition: [] ED /[x] Urgent Care (no appt availability in office) / [] Appointment(In office/virtual)/ []  [facility]/ [] Home Care/ [] Refused Recommended Disposition /[] [facility]/ []  Follow-up with PCP Additional Notes: Patient agrees to go to UC for symptoms- late Friday afternoon- no open appointment in office

## 2022-09-13 ENCOUNTER — Ambulatory Visit: Payer: Medicare HMO | Admitting: Family Medicine

## 2022-09-13 NOTE — Progress Notes (Unsigned)
Patient ID: Kiara Brady, female    DOB: [birth year], 77 y.o.   MRN: [id number]  PCP: Delsa Grana, PA-C  No chief complaint on file.   Subjective:   Kiara Brady is a 77 y.o. female, presents to clinic with CC of the following:  HPI    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   Osteoarthritis of knees, 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   Hyperlipidemia 03/23/2016   Cardiac murmur, previously undiagnosed 01/23/2016   Heart murmur 09/03/2015   Chronic pain of both knees 06/30/2015   Dyslipidemia associated with type 2 diabetes mellitus ([facility]) 02/26/2015   Diabetes mellitus, type 2 ([facility]) 01/06/2015      Current Outpatient Medications:    atorvastatin (LIPITOR) 10 MG tablet, Take 1 tablet (10 mg total) by mouth at bedtime., Disp: 90 tablet, Rfl: 0   cetirizine (ZYRTEC ALLERGY) 10 MG tablet, Take 1 tablet (10 mg total) by mouth at bedtime., Disp: 30 tablet, Rfl: 0   metFORMIN (GLUCOPHAGE-XR) 500 MG 24 hr tablet, Take 1 tablet (500 mg total) by mouth daily with breakfast., Disp: 180 tablet, Rfl: 1   Allergies  Allergen Reactions   Oxycodone Other (See Comments)   Penicillin G Hives and Shortness Of Breath   Protonix [Pantoprazole Sodium] Shortness Of Breath   Other Other (See Comments)   Penicillins    Prednisone Cough   Vitamin D Analogs     Chest pain   Xanax [Alprazolam]     hyperactivity   Fosamax [Alendronate Sodium] Nausea Only     Social History   Tobacco Use   Smoking status: Former   Smokeless tobacco: Never  Scientific laboratory technician Use: Never used  Substance Use Topics   Alcohol use: No   Drug use: No      Chart Review Today: ***  Review of Systems     Objective:   There were no vitals filed for this visit.  There is no height or weight on file to calculate BMI.  Physical Exam   Results for orders placed or  performed in visit on 05/18/22  COMPLETE METABOLIC PANEL WITH GFR  Result Value Ref Range   Glucose, Bld 105 (H) 65 - 99 mg/dL   BUN 7 7 - 25 mg/dL   Creat 0.97 0.60 - 1.00 mg/dL   eGFR 60 > OR = 60 mL/min/1.32m   BUN/Creatinine Ratio SEE NOTE: 6 - 22 (calc)   Sodium 140 135 - 146 mmol/L   Potassium 3.9 3.5 - 5.3 mmol/L   Chloride 102 98 - 110 mmol/L   CO2 28 20 - 32 mmol/L   Calcium 9.5 8.6 - 10.4 mg/dL   Total Protein 7.4 6.1 - 8.1 g/dL   Albumin 4.5 3.6 - 5.1 g/dL   Globulin 2.9 1.9 - 3.7 g/dL (calc)   AG Ratio 1.6 1.0 - 2.5 (calc)   Total Bilirubin 0.9 0.2 - 1.2 mg/dL   Alkaline phosphatase (APISO) 77 37 - 153 U/L   AST 18 10 - 35 U/L   ALT 10 6 - 29 U/L  Magnesium  Result Value Ref Range   Magnesium 2.1 1.5 - 2.5 mg/dL  Hemoglobin A1c  Result Value Ref Range   Hgb A1c MFr Bld 7.2 (H) <5.7 % of total Hgb   Mean Plasma Glucose 160 mg/dL   eAG (mmol/L) 8.9 mmol/L  CK (Creatine Kinase)  Result Value Ref Range   Total CK 89 29 - 143 U/L  Urinalysis, Routine w reflex microscopic  Result Value Ref Range   Color, Urine YELLOW YELLOW   APPearance CLEAR CLEAR   Specific Gravity, Urine 1.003 1.001 - 1.035   pH 5.5 5.0 - 8.0   Glucose, UA NEGATIVE NEGATIVE   Bilirubin Urine NEGATIVE NEGATIVE   Ketones, ur NEGATIVE NEGATIVE   Hgb urine dipstick NEGATIVE NEGATIVE   Protein, ur NEGATIVE NEGATIVE   Nitrite NEGATIVE NEGATIVE   Leukocytes,Ua NEGATIVE NEGATIVE       Assessment & Plan:   ***     Delsa Grana, PA-C 09/13/22 6:32 PM

## 2022-09-14 ENCOUNTER — Ambulatory Visit (INDEPENDENT_AMBULATORY_CARE_PROVIDER_SITE_OTHER): Payer: Medicare HMO | Admitting: Family Medicine

## 2022-09-14 ENCOUNTER — Encounter: Payer: Self-pay | Admitting: Family Medicine

## 2022-09-14 VITALS — BP 128/80 | HR 73 | Temp 98.1°F | Resp 16 | Ht 62.0 in | Wt 120.3 lb

## 2022-09-14 DIAGNOSIS — I83813 Varicose veins of bilateral lower extremities with pain: Secondary | ICD-10-CM | POA: Diagnosis not present

## 2022-09-14 DIAGNOSIS — E1165 Type 2 diabetes mellitus with hyperglycemia: Secondary | ICD-10-CM

## 2022-09-14 DIAGNOSIS — Z5181 Encounter for therapeutic drug level monitoring: Secondary | ICD-10-CM | POA: Diagnosis not present

## 2022-09-14 DIAGNOSIS — E1169 Type 2 diabetes mellitus with other specified complication: Secondary | ICD-10-CM | POA: Diagnosis not present

## 2022-09-14 DIAGNOSIS — R079 Chest pain, unspecified: Secondary | ICD-10-CM

## 2022-09-14 DIAGNOSIS — K219 Gastro-esophageal reflux disease without esophagitis: Secondary | ICD-10-CM

## 2022-09-14 DIAGNOSIS — E785 Hyperlipidemia, unspecified: Secondary | ICD-10-CM | POA: Diagnosis not present

## 2022-09-14 MED ORDER — FAMOTIDINE 20 MG PO TABS: 20.0000 mg | ORAL_TABLET | Freq: Two times a day (BID) | ORAL | 1 refills | Status: DC | PRN

## 2022-09-14 NOTE — Assessment & Plan Note (Signed)
Some "puffiness" reported by pt, no pitting edema, scattered chronic superficial veins

## 2022-09-14 NOTE — Assessment & Plan Note (Signed)
Pt taking atorvastatin 10 mg daily w/o reported SE or concerns Due for labs

## 2022-09-14 NOTE — Assessment & Plan Note (Signed)
Fairly well controlled per age with last A1C 7.3, only able to tolerate XR metformin 500 mg once daily - pt very adverse to meds in general but reported bothersome GI SE when dose was increased to BID DM foot exam done today - per pt request podiatry referral ordered - few nails thick/fungal disease On statin, not on ACEI/ARB Due for labs today - goal for pt to keep A1C under 7.5

## 2022-09-14 NOTE — Telephone Encounter (Signed)
Patient seen by Kristeen Miss today

## 2022-09-15 ENCOUNTER — Ambulatory Visit: Payer: Self-pay | Admitting: *Deleted

## 2022-09-15 LAB — COMPLETE METABOLIC PANEL WITH GFR
AG Ratio: 1.6 (calc) (ref 1.0–2.5)
ALT: 9 U/L (ref 6–29)
AST: 15 U/L (ref 10–35)
Albumin: 4.5 g/dL (ref 3.6–5.1)
Alkaline phosphatase (APISO): 80 U/L (ref 37–153)
BUN: 10 mg/dL (ref 7–25)
CO2: 26 mmol/L (ref 20–32)
Calcium: 9.7 mg/dL (ref 8.6–10.4)
Chloride: 101 mmol/L (ref 98–110)
Creat: 0.94 mg/dL (ref 0.60–1.00)
Globulin: 2.8 g/dL (calc) (ref 1.9–3.7)
Glucose, Bld: 102 mg/dL — ABNORMAL HIGH (ref 65–99)
Potassium: 3.9 mmol/L (ref 3.5–5.3)
Sodium: 139 mmol/L (ref 135–146)
Total Bilirubin: 0.6 mg/dL (ref 0.2–1.2)
Total Protein: 7.3 g/dL (ref 6.1–8.1)
eGFR: 62 mL/min/{1.73_m2} (ref >=60)

## 2022-09-15 LAB — LIPID PANEL
Cholesterol: 145 mg/dL (ref <200)
HDL: 63 mg/dL (ref >=50)
LDL Cholesterol (Calc): 63 mg/dL (calc)
Non-HDL Cholesterol (Calc): 82 mg/dL (calc) (ref <130)
Total CHOL/HDL Ratio: 2.3 (calc) (ref <5.0)
Triglycerides: 103 mg/dL (ref <150)

## 2022-09-15 LAB — HEMOGLOBIN A1C
Hgb A1c MFr Bld: 6.7 % of total Hgb — ABNORMAL HIGH (ref <5.7)
Mean Plasma Glucose: 146 mg/dL
eAG (mmol/L): 8.1 mmol/L

## 2022-09-15 NOTE — Telephone Encounter (Signed)
Reason for Disposition  [1] Chest pain (or "angina") comes and goes AND [2] is happening more often (increasing in frequency) or getting worse (increasing in severity)  (Exception: Chest pains that last only a few seconds.)  Answer Assessment - Initial Assessment Questions 1. LOCATION: "Where does it hurt?"       Midline- lower part of chest close to stomach and midline 2. RADIATION: "Does the pain go anywhere else?" (e.g., into neck, jaw, arms, back)     Middle only, stomach pain- pressure 3. ONSET: "When did the chest pain begin?" (Minutes, hours or days)      Ongoing- has been treating reflux- started Rx today 4. PATTERN: "Does the pain come and go, or has it been constant since it started?"  "Does it get worse with exertion?"      Comes and goes 5. DURATION: "How long does it last" (e.g., seconds, minutes, hours)     Few minutes- patient lays down and rest- that helps 6. SEVERITY: "How bad is the pain?"  (e.g., Scale 1-10; mild, moderate, or severe)    - MILD (1-3): doesn't interfere with normal activities     - MODERATE (4-7): interferes with normal activities or awakens from sleep    - SEVERE (8-10): excruciating pain, unable to do any normal activities       No pain- just uncomfortable 7. CARDIAC RISK FACTORS: "Do you have any history of heart problems or risk factors for heart disease?" (e.g., angina, prior heart attack; diabetes, high blood pressure, high cholesterol, smoker, or strong family history of heart disease)     Diabetes, high cholesterol  8. PULMONARY RISK FACTORS: "Do you have any history of lung disease?"  (e.g., blood clots in lung, asthma, emphysema, birth control pills)     no 9. CAUSE: "What do you think is causing the chest pain?"     GERD possible 10. OTHER SYMPTOMS: "Do you have any other symptoms?" (e.g., dizziness, nausea, vomiting, sweating, fever, difficulty breathing, cough)       dizziness 11. PREGNANCY: "Is there any chance you are pregnant?" "When was  your last menstrual period?"       na  Protocols used: Chest Pain-A-AH

## 2022-09-15 NOTE — Telephone Encounter (Signed)
  Chief Complaint: chest pain Symptoms: midline chest pain- twice today, dizziness Frequency: ongoing Pertinent Negatives: Patient denies nausea, vomiting, sweating, fever, difficulty breathing, cough Disposition: [x] ED /[] Urgent Care (no appt availability in office) / [] Appointment(In office/virtual)/ []  [facility]/ [] Home Care/ [] Refused Recommended Disposition /[] [facility]/ []  Follow-up with PCP Additional Notes: Patient advised ED for evaluation

## 2022-09-16 ENCOUNTER — Telehealth: Payer: Self-pay | Admitting: Family Medicine

## 2022-09-16 ENCOUNTER — Ambulatory Visit: Payer: Medicare HMO

## 2022-09-16 DIAGNOSIS — J069 Acute upper respiratory infection, unspecified: Secondary | ICD-10-CM | POA: Diagnosis not present

## 2022-09-16 NOTE — Telephone Encounter (Signed)
Copied from [place]. Topic: General - Other >> Sep 16, 2022  8:05 AM Everette C wrote: Reason for CRM: The patient has called to request orders for COVID testing   The patient would like to be contacted when the lab orders are created  Please contact the patient further when possible

## 2022-09-16 NOTE — Telephone Encounter (Signed)
Can you schedule her under nurse visit if she would like to.

## 2022-09-16 NOTE — Telephone Encounter (Signed)
Pt notified and stated she will not go to ER unless she gets her COVID testing done. Separate encounter sent to you

## 2022-09-16 NOTE — Telephone Encounter (Signed)
Pt called back and will do the test.

## 2022-09-16 NOTE — Telephone Encounter (Signed)
Tried to call pt to relay the message from [place]. If pt calls back, please let her know that we can do a covid test before 4:15 today or tomorrow and also that results wont be back until possibly Monday and no meds will be given until results come back, per nurse

## 2022-09-18 LAB — NOVEL CORONAVIRUS, NAA: SARS-CoV-2, NAA: NOT DETECTED

## 2022-10-12 ENCOUNTER — Ambulatory Visit: Payer: Medicare HMO | Admitting: Family Medicine

## 2022-10-21 ENCOUNTER — Other Ambulatory Visit: Payer: Self-pay | Admitting: Internal Medicine

## 2022-10-21 DIAGNOSIS — E119 Type 2 diabetes mellitus without complications: Secondary | ICD-10-CM

## 2022-10-21 DIAGNOSIS — E1169 Type 2 diabetes mellitus with other specified complication: Secondary | ICD-10-CM

## 2022-10-21 DIAGNOSIS — E1165 Type 2 diabetes mellitus with hyperglycemia: Secondary | ICD-10-CM

## 2022-10-22 NOTE — Telephone Encounter (Signed)
Requested Prescriptions  Pending Prescriptions Disp Refills   metFORMIN (GLUCOPHAGE-XR) 500 MG 24 hr tablet [Pharmacy Med Name: metFORMIN HCl ER 500 MG Oral Tablet Extended Release 24 Hour] 180 tablet 0    Sig: TAKE 1 TABLET BY MOUTH TWICE DAILY WITH A MEAL     Endocrinology:  Diabetes - Biguanides Failed - 10/21/2022  3:23 PM      Failed - B12 Level in normal range and within 720 days    Vitamin B-12  Date Value Ref Range Status  09/26/2020 744 200 - 1,100 pg/mL Final         Failed - CBC within normal limits and completed in the last 12 months    WBC  Date Value Ref Range Status  08/29/2020 9.0 3.8 - 10.8 Thousand/uL Final   RBC  Date Value Ref Range Status  08/29/2020 4.05 3.80 - 5.10 Million/uL Final   Hemoglobin  Date Value Ref Range Status  08/29/2020 12.6 11.7 - 15.5 g/dL Final   HCT  Date Value Ref Range Status  08/29/2020 37.6 35.0 - 45.0 % Final   MCHC  Date Value Ref Range Status  08/29/2020 33.5 32.0 - 36.0 g/dL Final   [facility]  Date Value Ref Range Status  08/29/2020 31.1 27.0 - 33.0 pg Final   MCV  Date Value Ref Range Status  08/29/2020 92.8 80.0 - 100.0 fL Final   No results found for: "PLTCOUNTKUC", "LABPLAT", "POCPLA" RDW  Date Value Ref Range Status  08/29/2020 11.9 11.0 - 15.0 % Final         Passed - Cr in normal range and within 360 days    Creat  Date Value Ref Range Status  09/14/2022 0.94 0.60 - 1.00 mg/dL Final   Creatinine, Urine  Date Value Ref Range Status  03/08/2022 62 20 - 275 mg/dL Final         Passed - HBA1C is between 0 and 7.9 and within 180 days    Hgb A1c MFr Bld  Date Value Ref Range Status  09/14/2022 6.7 (H) <5.7 % of total Hgb Final    Comment:    For someone without known diabetes, a hemoglobin A1c value of 6.5% or greater indicates that they may have  diabetes and this should be confirmed with a follow-up  test. . For someone with known diabetes, a value <7% indicates  that their diabetes is well controlled  and a value  greater than or equal to 7% indicates suboptimal  control. A1c targets should be individualized based on  duration of diabetes, age, comorbid conditions, and  other considerations. . Currently, no consensus exists regarding use of hemoglobin A1c for diagnosis of diabetes for children. .          Passed - eGFR in normal range and within 360 days    GFR, Est African American  Date Value Ref Range Status  08/29/2020 78 > OR = 60 mL/min/1.25m2 Final   GFR, Est Non African American  Date Value Ref Range Status  08/29/2020 67 > OR = 60 mL/min/1.73m2 Final   eGFR  Date Value Ref Range Status  09/14/2022 62 > OR = 60 mL/min/1.68m2 Final         Passed - Valid encounter within last 6 months    Recent Outpatient Visits           1 month ago Type 2 diabetes mellitus with hyperglycemia, without long-term current use of insulin [facility])   [place],  Kristeen Miss, PA-C   4 months ago Traumatic injury of head, initial encounter   [facility] Teodora Medici, DO   5 months ago Diarrhea due to drug   [facility] Delsa Grana, PA-C   7 months ago Type 2 diabetes mellitus with hyperglycemia, without long-term current use of insulin [facility])   [facility] Teodora Medici, DO   8 months ago Rash and nonspecific skin eruption   [facility] Lucio Edward, [place], PA-C               atorvastatin (LIPITOR) 10 MG tablet [Pharmacy Med Name: Atorvastatin Calcium 10 MG Oral Tablet] 90 tablet 0    Sig: TAKE 1 TABLET BY MOUTH AT BEDTIME     Cardiovascular:  Antilipid - Statins Failed - 10/21/2022  3:23 PM      Failed - Lipid Panel in normal range within the last 12 months    Cholesterol, Total  Date Value Ref Range Status  09/08/2015 166 100 - 199 mg/dL Final   Cholesterol  Date Value Ref Range Status  09/14/2022 145 <200 mg/dL Final   LDL  Cholesterol (Calc)  Date Value Ref Range Status  09/14/2022 63 mg/dL (calc) Final    Comment:    Reference range: <100 . Desirable range <100 mg/dL for primary prevention;   <70 mg/dL for patients with CHD or diabetic patients  with > or = 2 CHD risk factors. Marland Kitchen LDL-C is now calculated using the Martin-Hopkins  calculation, which is a validated novel method providing  better accuracy than the Friedewald equation in the  estimation of LDL-C.  Cresenciano Genre et al. Annamaria Helling. [id number]): 2061-2068  (http://education.[employer].com/faq/FAQ164)    HDL  Date Value Ref Range Status  09/14/2022 63 > OR = 50 mg/dL Final  09/08/2015 68 >39 mg/dL Final   Triglycerides  Date Value Ref Range Status  09/14/2022 103 <150 mg/dL Final         Passed - Patient is not pregnant      Passed - Valid encounter within last 12 months    Recent Outpatient Visits           1 month ago Type 2 diabetes mellitus with hyperglycemia, without long-term current use of insulin [facility])   [facility] Delsa Grana, PA-C   4 months ago Traumatic injury of head, initial encounter   [place], DO   5 months ago Diarrhea due to drug   [facility] Delsa Grana, PA-C   7 months ago Type 2 diabetes mellitus with hyperglycemia, without long-term current use of insulin [facility])   [place], DO   8 months ago Rash and nonspecific skin eruption   [facility] Delsa Grana, [place]

## 2022-11-30 ENCOUNTER — Telehealth: Payer: Self-pay | Admitting: Family Medicine

## 2022-11-30 NOTE — Telephone Encounter (Signed)
Called patient to schedule Medicare Annual Wellness Visit (AWV). No voicemail available to leave a message.  Last date of AWV: 04/15/2022  Please schedule an appointment at any time with [facility].  If any questions, please contact me at [phone].  Thank you ,  Randon Goldsmith Care Guide [facility] AWV TEAM Direct Dial: [phone]

## 2023-01-16 ENCOUNTER — Other Ambulatory Visit: Payer: Self-pay | Admitting: Family Medicine

## 2023-01-16 DIAGNOSIS — E1169 Type 2 diabetes mellitus with other specified complication: Secondary | ICD-10-CM

## 2023-01-16 DIAGNOSIS — E119 Type 2 diabetes mellitus without complications: Secondary | ICD-10-CM

## 2023-01-23 ENCOUNTER — Other Ambulatory Visit: Payer: Self-pay | Admitting: Family Medicine

## 2023-01-23 DIAGNOSIS — E1169 Type 2 diabetes mellitus with other specified complication: Secondary | ICD-10-CM

## 2023-01-23 DIAGNOSIS — E119 Type 2 diabetes mellitus without complications: Secondary | ICD-10-CM

## 2023-02-11 ENCOUNTER — Ambulatory Visit: Payer: Self-pay

## 2023-02-11 NOTE — Telephone Encounter (Signed)
Chief Complaint: Itching Symptoms: Patient reports itching all over body after racking leaves in the yard. Frequency: 3 days but yesterday was very itchy Pertinent Negatives: Patient denies open areas from scratching Disposition: [] ED /[] Urgent Care (no appt availability in office) / [] Appointment(In office/virtual)/ []  [facility]/ [x] Home Care/ [] Refused Recommended Disposition /[] [facility]/ []  Follow-up with PCP Additional Notes: Patient states she has had itching all over her body after racking leaves in her yard 3 days ago. Patient states when she puts an ice pack on the skin it feels better. Patient was given home care advise on how to decrease itching. Advised patient to callback if the itching persist or becomes worse. Patient verbalized understanding.  Summary: rash, itching, scractching   Rash, itching, scratching three days     Reason for Disposition  Itching from pollen exposure (e.g., after rolling in grass)  Answer Assessment - Initial Assessment Questions 1. DESCRIPTION: "Describe the itching you are having."     I am itching all over  2. SEVERITY: "How bad is it?"    - MILD: Doesn't interfere with normal activities.   - MODERATE-SEVERE: Interferes with work, school, sleep, or other activities.      Moderate  3. SCRATCHING: "Are there any scratch marks? Bleeding?"     No, it is just red after scratch 4. ONSET: "When did this begin?"      3 days ago 5. CAUSE: "What do you think is causing the itching?" (ask about swimming pools, pollen, animals, soaps, etc.)     I do not know. I live around a lot of trees and Ivy plants. Maybe when I was racking the leaves sometimes that makes me itch. 6. OTHER SYMPTOMS: "Do you have any other symptoms?"      No  Protocols used: Itching - [facility]

## 2023-02-17 ENCOUNTER — Telehealth: Payer: Self-pay | Admitting: Family Medicine

## 2023-02-17 ENCOUNTER — Other Ambulatory Visit: Payer: Self-pay

## 2023-02-17 DIAGNOSIS — Z1231 Encounter for screening mammogram for malignant neoplasm of breast: Secondary | ICD-10-CM

## 2023-02-17 NOTE — Telephone Encounter (Signed)
Order has been placed imaging department will contact pt to schedule.

## 2023-02-17 NOTE — Telephone Encounter (Signed)
Spoke with pt and appt sch'd with Dr Caralee Ates for 7.19.2024

## 2023-02-17 NOTE — Telephone Encounter (Signed)
Pt needs appt in order for something to be prescribed.

## 2023-02-17 NOTE — Telephone Encounter (Signed)
Pt is calling in because she wants to know if her PCP can send her something in for itching. Pt says she has began to itch again after being in the yard. Pt says she has been using frozen bottled water to help soothe the itching. Pt says she isn't sick so she doesn't need to come in, but she would like a prescription to help. Please advise.

## 2023-02-17 NOTE — Telephone Encounter (Signed)
Referral Request - Has patient seen PCP for this complaint? no *If NO, is insurance requiring patient see PCP for this issue before PCP can refer them? Referral for which specialty: mammogram Preferred provider/office: ? Reason for referral: annual mammogram

## 2023-02-18 ENCOUNTER — Ambulatory Visit (INDEPENDENT_AMBULATORY_CARE_PROVIDER_SITE_OTHER): Payer: Medicare HMO | Admitting: Internal Medicine

## 2023-02-18 ENCOUNTER — Encounter: Payer: Self-pay | Admitting: Internal Medicine

## 2023-02-18 VITALS — BP 116/62 | HR 80 | Temp 97.6°F | Resp 16 | Ht 62.0 in | Wt 114.4 lb

## 2023-02-18 DIAGNOSIS — L309 Dermatitis, unspecified: Secondary | ICD-10-CM | POA: Diagnosis not present

## 2023-02-18 MED ORDER — LORATADINE 10 MG PO TABS: 10.0000 mg | ORAL_TABLET | Freq: Every day | ORAL | 11 refills | Status: DC

## 2023-02-18 MED ORDER — HYDROCORTISONE 1 % EX OINT: 1.0000 | TOPICAL_OINTMENT | Freq: Two times a day (BID) | CUTANEOUS | 0 refills | Status: DC

## 2023-02-18 NOTE — Patient Instructions (Signed)
It was great seeing you today!  Plan discussed at today's visit: -Recommend steroid cream as needed for itching as well as starting a Claritin once a day  Follow up in: as needed  Take care and let us know if you have any questions or concerns prior to your next visit.  Dr. Caralee Ates

## 2023-02-18 NOTE — Progress Notes (Signed)
   Acute Office Visit  Subjective:     Patient ID: Kiara Brady, female    DOB: [birth year], 77 y.o.   MRN: [id number]  Chief Complaint  Patient presents with   Pruritis    Arms states when she goes outside    HPI Patient is in today for itching. Has been occurring on her arms and legs for the last week, worse after going outside but symptoms resolved this morning. States she has poison ivy in the yard. She did have a red, blistered appearing rash that started on her right forearm, now spreading on both arms and some spots on her legs as well. Itching is worse at night. Using cold water and apple cider vinegar which helps.   Review of Systems  Skin:  Positive for itching and rash.        Objective:    BP 116/62   Pulse 80   Temp 97.6 F (36.4 C)   Resp 16   Ht 5\' 2"  (1.575 m)   Wt 114 lb 6.4 oz (51.9 kg)   SpO2 98%   BMI 20.92 kg/m    Physical Exam Constitutional:      Appearance: Normal appearance.  HENT:     Head: Normocephalic and atraumatic.  Eyes:     Conjunctiva/sclera: Conjunctivae normal.  Cardiovascular:     Rate and Rhythm: Normal rate and regular rhythm.  Pulmonary:     Effort: Pulmonary effort is normal.     Breath sounds: Normal breath sounds.  Skin:    General: Skin is warm and dry.     Findings: Rash present.     Comments: Healing scabbed over lesions on bilateral forearms.  Neurological:     General: No focal deficit present.     Mental Status: She is alert. Mental status is at baseline.  Psychiatric:        Mood and Affect: Mood normal.        Behavior: Behavior normal.     No results found for any visits on 02/18/23.      Assessment & Plan:   1. Dermatitis: Appears to be some type of allergic dermatitis, could have been poison ivy but healing now. Will prescribe hydrocortisone cream and start oral anti-histamine as needed if itching starts again. Recommend long sleeved shirts, gloves and long pants while out in the garden.   -  hydrocortisone 1 % ointment; Apply 1 Application topically 2 (two) times daily.  Dispense: 30 g; Refill: 0 - loratadine (CLARITIN) 10 MG tablet; Take 1 tablet (10 mg total) by mouth daily.  Dispense: 30 tablet; Refill: 11   Return if symptoms worsen or fail to improve.  Margarita Mail, DO

## 2023-03-03 DIAGNOSIS — H2513 Age-related nuclear cataract, bilateral: Secondary | ICD-10-CM | POA: Diagnosis not present

## 2023-03-03 DIAGNOSIS — H524 Presbyopia: Secondary | ICD-10-CM | POA: Diagnosis not present

## 2023-03-03 DIAGNOSIS — H25013 Cortical age-related cataract, bilateral: Secondary | ICD-10-CM | POA: Diagnosis not present

## 2023-03-03 DIAGNOSIS — E119 Type 2 diabetes mellitus without complications: Secondary | ICD-10-CM | POA: Diagnosis not present

## 2023-03-12 LAB — HM DIABETES EYE EXAM

## 2023-03-21 ENCOUNTER — Ambulatory Visit
Admission: RE | Admit: 2023-03-21 | Discharge: 2023-03-21 | Disposition: A | Discharge status: Home / Self Care | Payer: Medicare HMO | Source: Ambulatory Visit | Attending: Family Medicine | Admitting: Family Medicine

## 2023-03-21 DIAGNOSIS — Z1231 Encounter for screening mammogram for malignant neoplasm of breast: Secondary | ICD-10-CM | POA: Insufficient documentation

## 2023-03-23 DIAGNOSIS — H538 Other visual disturbances: Secondary | ICD-10-CM | POA: Diagnosis not present

## 2023-03-23 DIAGNOSIS — H2513 Age-related nuclear cataract, bilateral: Secondary | ICD-10-CM | POA: Diagnosis not present

## 2023-04-11 ENCOUNTER — Encounter: Payer: Self-pay | Admitting: Family Medicine

## 2023-04-11 ENCOUNTER — Ambulatory Visit: Payer: Medicare HMO | Admitting: Family Medicine

## 2023-04-11 VITALS — BP 136/74 | HR 73 | Temp 97.3°F | Resp 16 | Ht 62.0 in | Wt 116.4 lb

## 2023-04-11 DIAGNOSIS — R21 Rash and other nonspecific skin eruption: Secondary | ICD-10-CM | POA: Diagnosis not present

## 2023-04-11 DIAGNOSIS — H579 Unspecified disorder of eye and adnexa: Secondary | ICD-10-CM | POA: Diagnosis not present

## 2023-04-11 MED ORDER — LORATADINE 10 MG PO TABS: 10.0000 mg | ORAL_TABLET | Freq: Every evening | ORAL | 11 refills | Status: DC | PRN

## 2023-04-11 MED ORDER — TRIAMCINOLONE ACETONIDE 0.025 % EX OINT: 1.0000 | TOPICAL_OINTMENT | Freq: Two times a day (BID) | CUTANEOUS | 0 refills | Status: DC | PRN

## 2023-04-11 NOTE — Progress Notes (Signed)
Patient ID: Kiara Brady, female    DOB: [birth year], 77 y.o.   MRN: [id number]  PCP: [name], PA-C  Chief Complaint  Patient presents with   Eye Problem   Skin Problem    Pt went to a gas station and gas got on her eyes and skin. Pt states its very itchy    Subjective:   Kiara Brady is a 77 y.o. 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   Osteoarthritis of knees, 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   Hyperlipidemia 03/23/2016   Cardiac murmur, previously undiagnosed 01/23/2016   Heart murmur 09/03/2015   Chronic pain of both knees 06/30/2015   Dyslipidemia associated with type 2 diabetes mellitus (HCC) 02/26/2015   Diabetes mellitus, type 2 (HCC) 01/06/2015      Current Outpatient Medications:    atorvastatin (LIPITOR) 10 MG tablet, TAKE 1 TABLET BY MOUTH AT BEDTIME, Disp: 90 tablet, Rfl: 0   famotidine (PEPCID) 20 MG tablet, Take 1 tablet (20 mg total) by mouth 2 (two) times daily as needed for heartburn or indigestion (reflux)., Disp: 60 tablet, Rfl: 1   hydrocortisone 1 % ointment, Apply 1 Application topically 2 (two) times daily., Disp: 30 g, Rfl: 0   loratadine (CLARITIN) 10 MG tablet, Take 1 tablet (10 mg total) by mouth daily., Disp: 30 tablet, Rfl: 11   metFORMIN (GLUCOPHAGE-XR) 500 MG 24 hr tablet, TAKE 1 TABLET BY MOUTH TWICE DAILY WITH A MEAL, Disp: 180 tablet, Rfl: 0   Allergies  Allergen Reactions   Oxycodone Other (See Comments)   Penicillin G Hives and Shortness Of Breath   Protonix [Pantoprazole  Sodium] Shortness Of Breath   Other Other (See Comments)   Penicillins    Prednisone Cough   Vitamin D Analogs     Chest pain   Xanax [Alprazolam]     hyperactivity   Fosamax [Alendronate Sodium] Nausea Only     Social History   Tobacco Use   Smoking status: Former   Smokeless tobacco: Never  Vaping Use   Vaping status: Never Used  Substance Use Topics   Alcohol use: No   Drug use: No      Chart Review Today: I personally reviewed active problem list, medication list, allergies, family history, social history, health maintenance, notes from last encounter, lab results, imaging with the patient/caregiver today.   Review of Systems  Constitutional: Negative.   HENT: Negative.    Eyes: Negative.   Respiratory: Negative.    Cardiovascular: Negative.   Gastrointestinal: Negative.   Endocrine: Negative.   Genitourinary: Negative.   Musculoskeletal: Negative.   Skin: Negative.   Allergic/Immunologic: Negative.   Neurological: Negative.   Hematological: Negative.   Psychiatric/Behavioral: Negative.    All other systems reviewed and are negative.      Objective:   Vitals:   04/11/23 1548  BP: 136/74  Pulse: 73  Resp: 16  Temp: (!) 97.3 F (36.3 C)  TempSrc: Oral  SpO2: 99%  Weight: 116 lb 6.4 oz (52.8 kg)  Height:  5\' 2"  (1.575 m)    Body mass index is 21.29 kg/m.  Physical Exam Vitals and nursing note reviewed.  Constitutional:      Appearance: She is well-developed.  HENT:     Head: Normocephalic and atraumatic.     Nose: Nose normal.  Eyes:     General:        Right eye: No discharge.        Left eye: No discharge.     Conjunctiva/sclera: Conjunctivae normal.     Pupils: Pupils are equal, round, and reactive to light.  Neck:     Trachea: No tracheal deviation.  Cardiovascular:     Rate and Rhythm: Normal rate and regular rhythm.  Pulmonary:     Effort: Pulmonary effort is normal. No respiratory distress.     Breath sounds: No stridor.   Musculoskeletal:        General: Normal range of motion.  Skin:    General: Skin is warm and dry.     Findings: Rash present.  Neurological:     Mental Status: She is alert.     Motor: No abnormal muscle tone.     Coordination: Coordination normal.  Psychiatric:        Behavior: Behavior normal.      Results for orders placed or performed in visit on 03/14/23  HM DIABETES EYE EXAM  Result Value Ref Range   HM Diabetic Eye Exam No Retinopathy No Retinopathy       Assessment & Plan:   1. Rash and nonspecific skin eruption Contact dermatitis vs atopic dermatitis  Recommended pt tx with avoiding strong soaps/chemicals and hydrates with vaseline, starts a daily 2nd gen antihistamine like claritin, and to closed intact skin with raised patches or rash she can try rx steroid, use sparingly - triamcinolone (KENALOG) 0.025 % ointment; Apply 1 Application topically 2 (two) times daily as needed (rash/itching to intact skin).  Dispense: 30 g; Refill: 0 Pt may need PO steroids but she refuses Excoriations present - no signs currently of a secondary infection Offered oral steroid taper and stronger meds for pruritus but pt declined Encouraged her to f/up sooner if rash is not improving or if spreading  2. Itchy eyes PO and OTC 2nd gen antihistamine like claritin will likely also help with itchy eyes Pataday drop OTC for allergies No exudate or swelling noted      [name], PA-C 04/11/23 4:08 PM

## 2023-04-11 NOTE — Patient Instructions (Signed)
Try the loratidine or cetirizine daily at bedtime for 1 to 2 weeks to help minimize the rash and itching Main treatment I would try for your skin is moisturizing it with vaseline and try the ointment I sent in for your rash/itching    Contact Dermatitis Dermatitis is when your skin becomes red, sore, and swollen.  Contact dermatitis happens when your body reacts to something that touches the skin. There are 2 types: Irritant contact dermatitis. This is when something bothers your skin, like soap. Allergic contact dermatitis. This is when your skin touches something you are allergic to, like poison ivy. What are the causes? Irritant contact dermatitis may be caused by: Makeup. Soaps. Detergents. Bleaches. Acids. Metals, like nickel. Allergic contact dermatitis may be caused by: Plants. Chemicals. Jewelry. Latex. Medicines. Preservatives. These are things added to products to help them last longer. There may be some in your clothes. What increases the risk? Having a job where you have to be near things that bother your skin. Having asthma or eczema. What are the signs or symptoms?  Dry or flaky skin. Redness. Cracks. Itching. Moderate symptoms of this condition include: Pain or a burning feeling. Blisters. Blood or clear fluid coming from cracks in your skin. Swelling. This may be on your eyelids, mouth, or genitals. How is this treated? Your doctor will find out what is making your skin react. Then, you can protect your skin. You may need to use: Steroid creams, ointments, or medicines. Antibiotics or other ointments, if you have a skin infection. Lotion or medicines to help with itching. A bandage. Follow these instructions at home: Skin care Put moisturizer on your skin when it needs it. Put cool, wet cloths on your skin (cool compresses). Put a baking soda paste on your skin. Stir water into baking soda until it looks like a paste. Do not scratch your skin. Try  not to have things rub up against your skin. Avoid tight clothing. Avoid using soaps, perfumes, and dyes. Check your skin every day for signs of infection. Check for: More redness, swelling, or pain. More fluid or blood. Warmth. Pus or a bad smell. Medicines Take or apply over-the-counter and prescription medicines only as told by your doctor. If you were prescribed antibiotics, take or apply them as told by your doctor. Do not stop using them even if you start to feel better. Bathing Take a bath with: Epsom salts. Baking soda. Colloidal oatmeal. Bathe less often. Bathe in warm water. Try not to use hot water. Bandage care If you were given a bandage, change it as told by your doctor. Wash your hands with soap and water for at least 20 seconds before and after you change your bandage. If you cannot use soap and water, use hand sanitizer. General instructions Avoid the things that caused your reaction. If you don't know what caused it, keep a journal. Write down: What you eat. What skin products you use. What you drink. What you wear. Contact a doctor if: You do not get better with treatment. You get worse. You have signs of infection. You have a fever. You have new symptoms. Your bone or joint near the area hurts after the skin has healed. Get help right away if: You see red streaks coming from the area. The area turns darker. You have trouble breathing. This information is not intended to replace advice given to you by your health care provider. Make sure you discuss any questions you have with your health care  provider. Document Revised: 01/22/2022 Document Reviewed: 01/22/2022 Elsevier Patient Education  2024 [employer].

## 2023-04-16 ENCOUNTER — Other Ambulatory Visit: Payer: Self-pay | Admitting: Family Medicine

## 2023-04-16 DIAGNOSIS — E1169 Type 2 diabetes mellitus with other specified complication: Secondary | ICD-10-CM

## 2023-04-16 DIAGNOSIS — E119 Type 2 diabetes mellitus without complications: Secondary | ICD-10-CM

## 2023-04-20 ENCOUNTER — Other Ambulatory Visit: Payer: Self-pay

## 2023-04-20 ENCOUNTER — Emergency Department
Admission: EM | Admit: 2023-04-20 | Discharge: 2023-04-20 | Discharge status: Against Medical Advice | Payer: Medicare HMO | Attending: Emergency Medicine | Admitting: Emergency Medicine

## 2023-04-20 DIAGNOSIS — R42 Dizziness and giddiness: Secondary | ICD-10-CM | POA: Diagnosis not present

## 2023-04-20 DIAGNOSIS — R079 Chest pain, unspecified: Secondary | ICD-10-CM | POA: Insufficient documentation

## 2023-04-20 DIAGNOSIS — R0602 Shortness of breath: Secondary | ICD-10-CM | POA: Diagnosis not present

## 2023-04-20 DIAGNOSIS — Z5321 Procedure and treatment not carried out due to patient leaving prior to being seen by health care provider: Secondary | ICD-10-CM | POA: Diagnosis not present

## 2023-04-20 NOTE — ED Notes (Signed)
Pt saying she feels better and does not want to stay because she is afraid she can't afford the bill. I explained that we are legally unable to give her a co-pay until she sees a physician, but once she sees the physician it will create a bill. I explained the risks of not being evaluated included death. She verbalizes understanding and says she will call her doctor tomorrow.

## 2023-04-20 NOTE — ED Triage Notes (Signed)
Pt c/o SOB since last night. She said it was associated with CP that radiated to her L shoulder. 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 She reports she got dizzy while driving here.

## 2023-04-21 ENCOUNTER — Other Ambulatory Visit: Payer: Self-pay | Admitting: Family Medicine

## 2023-04-21 ENCOUNTER — Ambulatory Visit: Payer: Medicare HMO

## 2023-04-21 VITALS — BP 126/80 | Ht 62.0 in | Wt 115.5 lb

## 2023-04-21 DIAGNOSIS — Z0000 Encounter for general adult medical examination without abnormal findings: Secondary | ICD-10-CM

## 2023-04-21 DIAGNOSIS — E1169 Type 2 diabetes mellitus with other specified complication: Secondary | ICD-10-CM

## 2023-04-21 DIAGNOSIS — E1165 Type 2 diabetes mellitus with hyperglycemia: Secondary | ICD-10-CM

## 2023-04-21 DIAGNOSIS — Z13820 Encounter for screening for osteoporosis: Secondary | ICD-10-CM

## 2023-04-21 DIAGNOSIS — Z0001 Encounter for general adult medical examination with abnormal findings: Secondary | ICD-10-CM

## 2023-04-21 MED ORDER — METFORMIN HCL ER 500 MG PO TB24
500.0000 mg | ORAL_TABLET | Freq: Two times a day (BID) | ORAL | 0 refills | Status: DC
Start: 2023-04-21 — End: 2023-10-12

## 2023-04-21 NOTE — Progress Notes (Addendum)
Subjective:   Kiara Brady is a 77 y.o. female who presents for Medicare Annual (Subsequent) preventive examination.  Visit Complete: In person  Patient Medicare AWV questionnaire was completed by the patient on (not done); I have confirmed that all information answered by patient is correct and no changes since this date. Cardiac Risk Factors include: advanced age (>90men, >62 women);diabetes mellitus;dyslipidemia;sedentary lifestyle    Objective:    Today's Vitals   04/21/23 1411 04/21/23 1412  BP: 126/80   Weight: 115 lb 8 oz (52.4 kg)   Height: 5\' 2"  (1.575 m)   PainSc:  2    Body mass index is 21.13 kg/m.     04/21/2023    2:33 PM 04/20/2023    6:08 PM 04/15/2022    9:24 AM 04/14/2021    9:46 AM 03/13/2020   10:04 AM 08/24/2018   11:23 AM 08/01/2018    1:52 PM  Advanced Directives  Does Patient Have a Medical Advance Directive? No No No No Yes No No  Type of Agricultural consultant;Living will    Copy of Healthcare Power of Attorney in Chart?     No - copy requested    Would patient like information on creating a medical advance directive?  No - Patient declined No - Patient declined Yes (MAU/Ambulatory/Procedural Areas - Information given)   Yes (MAU/Ambulatory/Procedural Areas - Information given)    Current Medications (verified) Outpatient Encounter Medications as of 04/21/2023  Medication Sig   atorvastatin (LIPITOR) 10 MG tablet TAKE 1 TABLET BY MOUTH AT BEDTIME   metFORMIN (GLUCOPHAGE-XR) 500 MG 24 hr tablet TAKE 1 TABLET BY MOUTH TWICE DAILY WITH A MEAL   triamcinolone (KENALOG) 0.025 % ointment Apply 1 Application topically 2 (two) times daily as needed (rash/itching to intact skin).   famotidine (PEPCID) 20 MG tablet Take 1 tablet (20 mg total) by mouth 2 (two) times daily as needed for heartburn or indigestion (reflux). (Patient not taking: Reported on 04/21/2023)   hydrocortisone 1 % ointment Apply 1 Application topically 2 (two)  times daily. (Patient not taking: Reported on 04/21/2023)   loratadine (CLARITIN) 10 MG tablet Take 1 tablet (10 mg total) by mouth daily. (Patient not taking: Reported on 04/21/2023)   loratadine (CLARITIN) 10 MG tablet Take 1 tablet (10 mg total) by mouth at bedtime as needed for itching (rash). (Patient not taking: Reported on 04/21/2023)   No facility-administered encounter medications on file as of 04/21/2023.    Allergies (verified) Oxycodone, Penicillin g, Protonix [pantoprazole sodium], Other, Penicillins, Prednisone, Vitamin d analogs, Xanax [alprazolam], and Fosamax [alendronate sodium]   History: Past Medical History:  Diagnosis Date   Diabetes mellitus without complication (HCC)    Hyperlipidemia    Osteopenia of neck of femur 12/19/2017   Osteoporosis 02/06/2018   July 2019   Polio    [place]   Past Surgical History:  Procedure Laterality Date   VARICOSE VEIN SURGERY     Family History  Problem Relation Age of Onset   Heart disease Mother    Alzheimer's disease Mother    Heart attack Mother    Alcohol abuse Father    Cancer Maternal Grandmother        breast   Breast cancer Maternal Grandmother    Diabetes Sister    Social History   Socioeconomic History   Marital status: Divorced    Spouse name: Not on file  Number of children: 2   Years of education: Not on file   Highest education level: Master's degree (e.g., MA, MS, MEng, MEd, MSW, MBA)  Occupational History   Not on file  Tobacco Use   Smoking status: Former   Smokeless tobacco: Never  Vaping Use   Vaping status: Never Used  Substance and Sexual Activity   Alcohol use: No   Drug use: No   Sexual activity: Not Currently  Other Topics Concern   Not on file  Social History Narrative   Pt lives alone, daughter is in another country and son lives in [place].    Originally from the [place] [facility]   Financial Resource Strain: Low  Risk  (04/21/2023)   Overall Financial Resource Strain (CARDIA)    Difficulty of Paying Living Expenses: Not hard at all  Food Insecurity: No Food Insecurity (04/21/2023)   Hunger Vital Sign    Worried About Running Out of Food in the Last Year: Never true    Ran Out of Food in the Last Year: Never true  Transportation Needs: No Transportation Needs (04/21/2023)   PRAPARE - Administrator, Civil Service (Medical): No    Lack of Transportation (Non-Medical): No  Physical Activity: Inactive (04/21/2023)   Exercise Vital Sign    Days of Exercise per Week: 0 days    Minutes of Exercise per Session: 0 min  Stress: No Stress Concern Present (04/21/2023)   [employer] of Occupational Health - Occupational Stress Questionnaire    Feeling of Stress : Not at all  Social Connections: Socially Isolated (04/21/2023)   Social Connection and Isolation Panel [NHANES]    Frequency of Communication with Friends and Family: More than three times a week    Frequency of Social Gatherings with Friends and Family: Never    Attends Religious Services: Never    Database administrator or Organizations: No    Attends Engineer, structural: Never    Marital Status: Divorced    Tobacco Counseling Counseling given: Not Answered  Clinical Intake:  Pre-visit preparation completed: No  Pain : 0-10 Pain Score: 2  Pain Type: Chronic pain Pain Location: Shoulder Pain Orientation: Left Pain Descriptors / Indicators: Aching Pain Onset: More than a month ago Pain Frequency: Intermittent Pain Relieving Factors: pain patches  Pain Relieving Factors: pain patches  BMI - recorded: 21.13 Nutritional Status: BMI of 19-24  Normal Nutritional Risks: None Diabetes: Yes CBG done?: No Did pt. bring in CBG monitor from home?: No  How often do you need to have someone help you when you read instructions, pamphlets, or other written materials from your doctor or pharmacy?: 1 -  Never  Interpreter Needed?: No  Comments: lives alone with bird and dog Information entered by :: B.Nyasia Baxley,LPN   Activities of Daily Living    04/21/2023    2:33 PM 04/11/2023    3:48 PM  In your present state of health, do you have any difficulty performing the following activities:  Hearing? 0 0  Vision? 0 0  Difficulty concentrating or making decisions? 0 0  Walking or climbing stairs? 0 0  Dressing or bathing? 0 0  Doing errands, shopping? 0 0  Preparing Food and eating ? N   Using the Toilet? N   In the past six months, have you accidently leaked urine? N   Do you have problems with loss of bowel control? N   Managing your Medications? N  Managing your Finances? N   Housekeeping or managing your Housekeeping? N     Patient Care Team: [name], PA-C as PCP - General (Family Medicine)  Indicate any recent Medical Services you may have received from other than [facility] providers in the past year (date may be approximate).     Assessment:   This is a routine wellness examination for Sally.  Hearing/Vision screen Hearing Screening - Comments:: Sometimes hears not well after loud noices Vision Screening - Comments:: I am due for glasses but lost in accident Dr Lew Dawes   Goals Addressed             This Visit's Progress    Exercise 3x per week (30 min per time)   Not on track    Recommend walking 3 days a week for 20-30 minutes.       Depression Screen    04/21/2023    2:29 PM 04/11/2023    3:48 PM 02/18/2023   10:57 AM 09/14/2022   11:34 AM 05/18/2022   11:30 AM 04/15/2022    9:21 AM 03/08/2022    2:25 PM  PHQ 2/9 Scores  PHQ - 2 Score 0 0 0 0 0 0 0  PHQ- 9 Score  0 0 0 0 0 0    Fall Risk    04/21/2023    2:21 PM 04/11/2023    3:48 PM 02/18/2023   10:57 AM 09/14/2022   11:34 AM 05/18/2022   11:30 AM  Fall Risk   Falls in the past year? 1 0 0 1 1  Comment pt says she keeps falling from dizziness or blood suger or from gasoline gas at station       Number falls in past yr: 1 0 0 0 0  Injury with Fall? 0 0 0 0 1  Risk for fall due to : History of fall(s);Impaired balance/gait No Fall Risks  Impaired balance/gait Impaired mobility  Follow up Education provided;Falls prevention discussed Falls prevention discussed;Education provided;Falls evaluation completed  Falls prevention discussed;Education provided;Falls evaluation completed Falls prevention discussed;Education provided;Falls evaluation completed  Comment pt says she has boxes all in her home..thought she was moving to [place]        MEDICARE RISK AT HOME: Medicare Risk at Home Any stairs in or around the home?: No If so, are there any without handrails?: No Home free of loose throw rugs in walkways, pet beds, electrical cords, etc?: Yes Adequate lighting in your home to reduce risk of falls?: Yes Life alert?: No (I don't want help) Use of a cane, walker or w/c?: No Grab bars in the bathroom?: Yes Shower chair or bench in shower?: Yes Elevated toilet seat or a handicapped toilet?: No  TIMED UP AND GO:  Was the test performed?  Yes  Length of time to ambulate 10 feet: 10 sec Gait steady and fast without use of assistive device    Cognitive Function:    09/26/2020    1:05 PM  MMSE - Mini Mental State Exam  Orientation to time 5  Orientation to Place 5  Registration 3  Attention/ Calculation 5  Recall 3  Language- name 2 objects 2  Language- repeat 1  Language- follow 3 step command 3  Language- read & follow direction 1  Write a sentence 1  Copy design 1  Total score 30        04/21/2023    2:34 PM 04/15/2022    9:25 AM 08/01/2018    2:18 PM 01/10/2017  3:31 PM  6CIT Screen  What Year? 0 points 0 points 0 points 0 points  What month? 0 points 0 points 0 points 0 points  What time? 0 points 0 points 0 points 0 points  Count back from 20 0 points 0 points 0 points 0 points  Months in reverse 0 points 0 points 0 points 0 points  Repeat phrase 0 points 0  points 0 points 0 points  Total Score 0 points 0 points 0 points 0 points    Immunizations Immunization History  Administered Date(s) Administered   Fluad Quad(high Dose 65+) 04/14/2021   Influenza, High Dose Seasonal PF 06/09/2018, 04/17/2019, 04/15/2022   Influenza-Unspecified 05/06/2017, 06/09/2018, 03/02/2020   Moderna Sars-Covid-2 Vaccination 09/14/2019, 10/12/2019, 05/22/2020, 10/29/2020, 04/25/2022   Pneumococcal Conjugate-13 06/05/2016, 07/01/2017   Pneumococcal Polysaccharide-23 05/10/2019   Tdap 08/24/2018   Zoster Recombinant(Shingrix) 09/25/2021, 11/23/2021    TDAP status: Up to date  Flu Vaccine status: Declined, Education has been provided regarding the importance of this vaccine but patient still declined. Advised may receive this vaccine at local pharmacy or Health Dept. Aware to provide a copy of the vaccination record if obtained from local pharmacy or Health Dept. Verbalized acceptance and understanding.  Pneumococcal vaccine status: Up to date  Covid-19 vaccine status: Completed vaccines  Qualifies for Shingles Vaccine? Yes   Zostavax completed Yes   Shingrix Completed?: Yes  Screening Tests Health Maintenance  Topic Date Due   INFLUENZA VACCINE  03/03/2023   Diabetic kidney evaluation - Urine ACR  03/09/2023   HEMOGLOBIN A1C  03/15/2023   COVID-19 Vaccine (6 - 2023-24 season) 04/27/2023 (Originally 04/03/2023)   DEXA SCAN  04/28/2023   Diabetic kidney evaluation - eGFR measurement  09/15/2023   FOOT EXAM  09/15/2023   OPHTHALMOLOGY EXAM  03/11/2024   MAMMOGRAM  03/20/2024   Medicare Annual Wellness (AWV)  04/20/2024   DTaP/Tdap/Td (2 - Td or Tdap) 08/24/2028   Pneumonia Vaccine 67+ Years old  Completed   Hepatitis C Screening  Completed   Zoster Vaccines- Shingrix  Completed   HPV VACCINES  Aged Out   Colonoscopy  Discontinued    Health Maintenance  Health Maintenance Due  Topic Date Due   INFLUENZA VACCINE  03/03/2023   Diabetic kidney  evaluation - Urine ACR  03/09/2023   HEMOGLOBIN A1C  03/15/2023    Colorectal cancer screening: No longer required.   Mammogram status: No longer required due to age.  Bone Density status: Ordered yes. Pt provided with contact info and advised to call to schedule appt.  Lung Cancer Screening: (Low Dose CT Chest recommended if Age 54-80 years, 20 pack-year currently smoking OR have quit w/in 15years.) does not qualify.   Lung Cancer Screening Referral: no  Additional Screening:  Hepatitis C Screening: does not qualify; Completed yes  Vision Screening: Recommended annual ophthalmology exams for early detection of glaucoma and other disorders of the eye. Is the patient up to date with their annual eye exam?  Yes  Who is the provider or what is the name of the office in which the patient attends annual eye exams? Dr Lew Dawes If pt is not established with a provider, would they like to be referred to a provider to establish care? No .   Dental Screening: Recommended annual dental exams for proper oral hygiene  Diabetic Foot Exam: Diabetic Foot Exam: Completed yes  Community Resource Referral / Chronic Care Management: CRR required this visit?  No   CCM required this visit?  No    Plan:     I have personally reviewed and noted the following in the patient's chart:   Medical and social history Use of alcohol, tobacco or illicit drugs  Current medications and supplements including opioid prescriptions. Patient is not currently taking opioid prescriptions. Functional ability and status Nutritional status Physical activity Advanced directives List of other physicians Hospitalizations, surgeries, and ER visits in previous 12 months Vitals Screenings to include cognitive, depression, and falls Referrals and appointments  In addition, I have reviewed and discussed with patient certain preventive protocols, quality metrics, and best practice recommendations. A written personalized  care plan for preventive services as well as general preventive health recommendations were provided to patient.     Sue Lush, LPN   09/15/863   After Visit Summary: Printed and given to pt  Nurse Notes: Pt comes in steady on feet walking but leans up against wall on the way to room stating she gets dizzy at times. Pt then proceeded to walk normally to office. Pt's conversation was very random and inconsistent at times.She states there was a man's face with a beard in the floor. She also relays she has high blood sugars but does not check her BS as she cannot stand pricking her fingers. She relays she still has itching of her arms (noted to have some small red bumps about her wrists).   *Bone Density order placed *Pt requested refill of Metformin

## 2023-04-21 NOTE — Patient Instructions (Addendum)
Kiara Brady , Thank you for taking time to come for your Medicare Wellness Visit. I appreciate your ongoing commitment to your health goals. Please review the following plan we discussed and let me know if I can assist you in the future.   Referrals/Orders/Follow-Ups/Clinician Recommendations:  You have an order for:  []   2D Mammogram  []   3D Mammogram  [x]   Bone Density     Please call for appointment:  [facility]  [address] [place] [address] [phone]  [facility] [address] [place], [place] [address] [phone]  [facility] [address] [place], [place] [address] [phone]     This is a list of the screening recommended for you and due dates:  Health Maintenance  Topic Date Due   Flu Shot  03/03/2023   Yearly kidney health urinalysis for diabetes  03/09/2023   Hemoglobin A1C  03/15/2023   COVID-19 Vaccine (6 - 2023-24 season) 04/27/2023*   DEXA scan (bone density measurement)  04/28/2023   Yearly kidney function blood test for diabetes  09/15/2023   Complete foot exam   09/15/2023   Eye exam for diabetics  03/11/2024   Mammogram  03/20/2024   Medicare Annual Wellness Visit  04/20/2024   DTaP/Tdap/Td vaccine (2 - Td or Tdap) 08/24/2028   Pneumonia Vaccine  Completed   Hepatitis C Screening  Completed   Zoster (Shingles) Vaccine  Completed   HPV Vaccine  Aged Out   Colon Cancer Screening  Discontinued  *Topic was postponed. The date shown is not the original due date.    Advanced directives: (Declined) Advance directive discussed with you today. Even though you declined this today, please call our office should you change your mind, and we can give you the proper paperwork for you to fill out.  Next Medicare Annual Wellness Visit scheduled for next year: Yes 04/26/24 @ 2:15pm in person

## 2023-05-02 ENCOUNTER — Encounter: Payer: Self-pay | Admitting: Physician Assistant

## 2023-05-02 ENCOUNTER — Ambulatory Visit (INDEPENDENT_AMBULATORY_CARE_PROVIDER_SITE_OTHER): Payer: Medicare HMO | Admitting: Physician Assistant

## 2023-05-02 VITALS — BP 130/82 | HR 70 | Temp 97.9°F | Resp 12 | Wt 116.7 lb

## 2023-05-02 DIAGNOSIS — E1169 Type 2 diabetes mellitus with other specified complication: Secondary | ICD-10-CM

## 2023-05-02 DIAGNOSIS — Z0001 Encounter for general adult medical examination with abnormal findings: Secondary | ICD-10-CM | POA: Diagnosis not present

## 2023-05-02 DIAGNOSIS — Z7189 Other specified counseling: Secondary | ICD-10-CM | POA: Insufficient documentation

## 2023-05-02 DIAGNOSIS — Z0000 Encounter for general adult medical examination without abnormal findings: Secondary | ICD-10-CM | POA: Diagnosis not present

## 2023-05-02 DIAGNOSIS — E1165 Type 2 diabetes mellitus with hyperglycemia: Secondary | ICD-10-CM

## 2023-05-02 DIAGNOSIS — E785 Hyperlipidemia, unspecified: Secondary | ICD-10-CM | POA: Diagnosis not present

## 2023-05-02 DIAGNOSIS — Z23 Encounter for immunization: Secondary | ICD-10-CM
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
## 2023-05-02 NOTE — Assessment & Plan Note (Signed)
A voluntary discussion about advance care planning including the explanation and discussion of advance directives was extensively discussed  with the patient for 5 minutes with patient and myself present.  Explanation about the health care proxy and Living will was reviewed and packet with forms with explanation of how to fill them out was given.  During this discussion, the patient was able to identify a health care proxy as no one and plans to fill out the paperwork required.  Patient was offered a separate Advance Care Planning visit for further assistance with forms.

## 2023-05-02 NOTE — Patient Instructions (Signed)
I recommend going to see your Eye doctor to discuss your vision concerns

## 2023-05-03 LAB — CBC WITH DIFFERENTIAL/PLATELET
Absolute Monocytes: 517 {cells}/uL (ref 200–950)
Basophils Absolute: 30 {cells}/uL (ref 0–200)
Basophils Relative: 0.4 %
Eosinophils Absolute: 281 {cells}/uL (ref 15–500)
Eosinophils Relative: 3.7 %
HCT: 38.5 % (ref 35.0–45.0)
Hemoglobin: 12.6 g/dL (ref 11.7–15.5)
Lymphs Abs: 1984 {cells}/uL (ref 850–3900)
MCH: 30.4 pg (ref 27.0–33.0)
MCHC: 32.7 g/dL (ref 32.0–36.0)
MCV: 92.8 fL (ref 80.0–100.0)
MPV: 9.4 fL (ref 7.5–12.5)
Monocytes Relative: 6.8 %
Neutro Abs: 4788 {cells}/uL (ref 1500–7800)
Neutrophils Relative %: 63 %
Platelets: 397 10*3/uL (ref 140–400)
RBC: 4.15 10*6/uL (ref 3.80–5.10)
RDW: 11.9 % (ref 11.0–15.0)
Total Lymphocyte: 26.1 %
WBC: 7.6 10*3/uL (ref 3.8–10.8)

## 2023-05-03 LAB — COMPLETE METABOLIC PANEL WITH GFR
AG Ratio: 1.5 (calc) (ref 1.0–2.5)
ALT: 8 U/L (ref 6–29)
AST: 14 U/L (ref 10–35)
Albumin: 4.4 g/dL (ref 3.6–5.1)
Alkaline phosphatase (APISO): 86 U/L (ref 37–153)
BUN: 10 mg/dL (ref 7–25)
CO2: 27 mmol/L (ref 20–32)
Calcium: 9.7 mg/dL (ref 8.6–10.4)
Chloride: 102 mmol/L (ref 98–110)
Creat: 0.81 mg/dL (ref 0.60–1.00)
Globulin: 2.9 g/dL (ref 1.9–3.7)
Glucose, Bld: 91 mg/dL (ref 65–99)
Potassium: 3.9 mmol/L (ref 3.5–5.3)
Sodium: 141 mmol/L (ref 135–146)
Total Bilirubin: 0.9 mg/dL (ref 0.2–1.2)
Total Protein: 7.3 g/dL (ref 6.1–8.1)
eGFR: 74 mL/min/{1.73_m2} (ref >=60)

## 2023-05-03 LAB — HEMOGLOBIN A1C
Hgb A1c MFr Bld: 6.3 %{Hb} — ABNORMAL HIGH (ref <5.7)
Mean Plasma Glucose: 134 mg/dL
eAG (mmol/L): 7.4 mmol/L

## 2023-05-03 LAB — MICROALBUMIN / CREATININE URINE RATIO
Creatinine, Urine: 21 mg/dL (ref 20–275)
Microalb Creat Ratio: 14 mg/g{creat} (ref <30)
Microalb, Ur: 0.3 mg/dL

## 2023-05-03 LAB — LIPID PANEL
Cholesterol: 167 mg/dL (ref <200)
HDL: 81 mg/dL (ref >=50)
LDL Cholesterol (Calc): 70 mg/dL
Non-HDL Cholesterol (Calc): 86 mg/dL (ref <130)
Total CHOL/HDL Ratio: 2.1 (calc) (ref <5.0)
Triglycerides: 75 mg/dL (ref <150)

## 2023-05-04 NOTE — Progress Notes (Signed)
Your labs are back  Your A1c was 6.3 which is in goal - please continue with your current regimen. Good work. Your urine microalbumin looks good too and was in normal range Your CBC was normal- no signs of anemia Your electrolytes, liver and kidney function were overall normal at this time Your cholesterol looks great - good job.

## 2023-07-15 ENCOUNTER — Other Ambulatory Visit: Payer: Self-pay | Admitting: Family Medicine

## 2023-07-15 DIAGNOSIS — E1169 Type 2 diabetes mellitus with other specified complication: Secondary | ICD-10-CM

## 2023-07-15 DIAGNOSIS — E119 Type 2 diabetes mellitus without complications: Secondary | ICD-10-CM

## 2023-07-29 ENCOUNTER — Ambulatory Visit: Payer: Self-pay | Admitting: *Deleted
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
## 2023-08-02 ENCOUNTER — Encounter: Payer: Self-pay | Admitting: Internal Medicine

## 2023-08-02 ENCOUNTER — Ambulatory Visit (INDEPENDENT_AMBULATORY_CARE_PROVIDER_SITE_OTHER): Payer: Medicare HMO | Admitting: Internal Medicine

## 2023-08-02 ENCOUNTER — Other Ambulatory Visit: Payer: Self-pay

## 2023-08-02 VITALS — BP 120/70 | HR 98 | Temp 97.9°F | Resp 18 | Ht 62.0 in | Wt 119.5 lb

## 2023-08-02 DIAGNOSIS — T23131A Burn of first degree of multiple right fingers (nail), not including thumb, initial encounter: Secondary | ICD-10-CM
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
## 2023-08-02 NOTE — Patient Instructions (Addendum)
 It was great seeing you today!  Plan discussed at today's visit: -Keep area clean and covered. We will wrap it for you today, keep it covered and dry until recheck appointment.   Follow up in: 2 days  Take care and let us  know if you have any questions or concerns prior to your next visit.  Dr. Bernardo

## 2023-08-04 ENCOUNTER — Encounter: Payer: Self-pay | Admitting: Internal Medicine

## 2023-08-04 ENCOUNTER — Other Ambulatory Visit: Payer: Self-pay

## 2023-08-04 ENCOUNTER — Ambulatory Visit: Payer: Medicare HMO | Admitting: Internal Medicine

## 2023-08-04 VITALS — BP 120/70 | HR 81 | Temp 98.0°F | Resp 16 | Ht 62.0 in | Wt 119.9 lb

## 2023-08-04 DIAGNOSIS — T23131D Burn of first degree of multiple right fingers (nail), not including thumb, subsequent encounter: Secondary | ICD-10-CM
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
## 2023-08-04 NOTE — Patient Instructions (Signed)

## 2023-10-09 ENCOUNTER — Other Ambulatory Visit: Payer: Self-pay | Admitting: Family Medicine

## 2023-10-09 DIAGNOSIS — E119 Type 2 diabetes mellitus without complications: Secondary | ICD-10-CM

## 2023-10-09 DIAGNOSIS — E1169 Type 2 diabetes mellitus with other specified complication: Secondary | ICD-10-CM

## 2023-10-12 ENCOUNTER — Other Ambulatory Visit: Payer: Self-pay | Admitting: Family Medicine

## 2023-10-12 DIAGNOSIS — E1169 Type 2 diabetes mellitus with other specified complication: Secondary | ICD-10-CM

## 2023-10-12 DIAGNOSIS — E1165 Type 2 diabetes mellitus with hyperglycemia: Secondary | ICD-10-CM

## 2023-11-08 ENCOUNTER — Other Ambulatory Visit: Payer: Self-pay

## 2023-11-08 ENCOUNTER — Ambulatory Visit (INDEPENDENT_AMBULATORY_CARE_PROVIDER_SITE_OTHER): Admitting: Internal Medicine

## 2023-11-08 ENCOUNTER — Ambulatory Visit: Attending: Internal Medicine

## 2023-11-08 ENCOUNTER — Encounter: Payer: Self-pay | Admitting: Internal Medicine

## 2023-11-08 VITALS — BP 120/70 | HR 73 | Temp 97.8°F | Resp 18 | Ht 62.0 in | Wt 119.6 lb

## 2023-11-08 DIAGNOSIS — R55 Syncope and collapse: Secondary | ICD-10-CM | POA: Diagnosis not present

## 2023-11-08 DIAGNOSIS — E1165 Type 2 diabetes mellitus with hyperglycemia: Secondary | ICD-10-CM | POA: Diagnosis not present
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
## 2023-11-11 ENCOUNTER — Telehealth: Payer: Self-pay | Admitting: Family Medicine

## 2023-11-11 NOTE — Telephone Encounter (Unsigned)
 Copied from CRM [id number]. Topic: Clinical - Medication Question >> Nov 11, 2023 10:39 AM Beacher May wrote: Reason for CRM: Rose from [facility] called to get the CPT code for pt's heart monitor. Callback number is [phone].

## 2023-11-11 NOTE — Telephone Encounter (Signed)
 Copied from CRM [id number]. Topic: Clinical - Medication Question >> Nov 11, 2023 10:39 AM Beacher May wrote: Reason for CRM: Rose from [facility] called to get the CPT code for pt's heart monitor. Callback number is [phone].

## 2023-11-14 ENCOUNTER — Telehealth: Payer: Self-pay

## 2023-11-14 NOTE — Telephone Encounter (Signed)
 Copied from CRM [id number]. Topic: General - Other >> Nov 14, 2023  1:30 PM Shardie S wrote: Reason for CRM: Patient states that she received a heart monitor via the mail. She is unsure of how to set it up and wants to know the reason for the machine. Patient requesting a callback.

## 2023-11-15 NOTE — Telephone Encounter (Signed)
 Called patient mailbox full cannot except messages

## 2023-11-16 ENCOUNTER — Ambulatory Visit

## 2023-11-16 ENCOUNTER — Ambulatory Visit
Admission: RE | Admit: 2023-11-16 | Discharge: 2023-11-16 | Disposition: A | Discharge status: Home / Self Care | Source: Ambulatory Visit | Attending: Internal Medicine | Admitting: Internal Medicine

## 2023-11-16 DIAGNOSIS — R55 Syncope and collapse: Secondary | ICD-10-CM | POA: Insufficient documentation

## 2023-11-16 NOTE — Telephone Encounter (Signed)
 Called the [phone] number twice and each time they did not know who called and would not take codes provided

## 2023-11-16 NOTE — Telephone Encounter (Signed)
 CPT code 95284 for zio

## 2023-11-16 NOTE — Telephone Encounter (Signed)
 R55 code for syncope and collapse

## 2023-11-17 NOTE — Telephone Encounter (Signed)
 Copied from CRM [id number]. Topic: General - Other >> Nov 17, 2023 10:48 AM Emylou G wrote: Reason for CRM: Rose w/[employer] called.. patient called.. had rcvd zio monitor.. wants to know price.. pls call patient [phone]

## 2023-11-17 NOTE — Telephone Encounter (Signed)
 Called w/no, mb full. Unable to lvm informing patient we have no way to know the cost. She will have to ask her insurance.

## 2023-12-07 ENCOUNTER — Ambulatory Visit: Admitting: Family Medicine

## 2024-01-05 ENCOUNTER — Other Ambulatory Visit: Payer: Self-pay | Admitting: Family Medicine

## 2024-01-05 DIAGNOSIS — E119 Type 2 diabetes mellitus without complications: Secondary | ICD-10-CM

## 2024-01-05 DIAGNOSIS — E1169 Type 2 diabetes mellitus with other specified complication: Secondary | ICD-10-CM

## 2024-01-06 ENCOUNTER — Other Ambulatory Visit: Payer: Self-pay | Admitting: Family Medicine

## 2024-01-06 DIAGNOSIS — E1169 Type 2 diabetes mellitus with other specified complication: Secondary | ICD-10-CM

## 2024-01-06 DIAGNOSIS — E1165 Type 2 diabetes mellitus with hyperglycemia: Secondary | ICD-10-CM

## 2024-01-06 NOTE — Telephone Encounter (Signed)
 OV 11/08/23 Requested Prescriptions  Pending Prescriptions Disp Refills   atorvastatin  (LIPITOR) 10 MG tablet [Pharmacy Med Name: Atorvastatin  Calcium  10 MG Oral Tablet] 90 tablet 0    Sig: TAKE 1 TABLET BY MOUTH AT BEDTIME     Cardiovascular:  Antilipid - Statins Failed - 01/06/2024 10:29 AM      Failed - Valid encounter within last 12 months    Recent Outpatient Visits           1 month ago Syncope and collapse   [facility] [place], [place]              Failed - Lipid Panel in normal range within the last 12 months    Cholesterol, Total  Date Value Ref Range Status  09/08/2015 166 100 - 199 mg/dL Final   Cholesterol  Date Value Ref Range Status  05/02/2023 167 <200 mg/dL Final   LDL Cholesterol (Calc)  Date Value Ref Range Status  05/02/2023 70 mg/dL (calc) Final    Comment:    Reference range: <100 . Desirable range <100 mg/dL for primary prevention;   <70 mg/dL for patients with CHD or diabetic patients  with > or = 2 CHD risk factors. Aaron Aas LDL-C is now calculated using the Martin-Hopkins  calculation, which is a validated novel method providing  better accuracy than the Friedewald equation in the  estimation of LDL-C.  Melinda Sprawls et al. Erroll Heard. 4696;295(28): 2061-2068  (http://education.[facility].com/faq/FAQ164)    HDL  Date Value Ref Range Status  05/02/2023 81 > OR = 50 mg/dL Final  41/32/4401 68 >02 mg/dL Final   Triglycerides  Date Value Ref Range Status  05/02/2023 75 <150 mg/dL Final         Passed - Patient is not pregnant

## 2024-01-09 NOTE — Telephone Encounter (Signed)
 Requested Prescriptions  Pending Prescriptions Disp Refills   metFORMIN  (GLUCOPHAGE -XR) 500 MG 24 hr tablet [Pharmacy Med Name: metFORMIN  HCl ER 500 MG Oral Tablet Extended Release 24 Hour] 180 tablet 0    Sig: TAKE 1 TABLET BY MOUTH TWICE DAILY WITH A MEAL     Endocrinology:  Diabetes - Biguanides Failed - 01/09/2024 11:27 AM      Failed - HBA1C is between 0 and 7.9 and within 180 days    Hgb A1c MFr Bld  Date Value Ref Range Status  05/02/2023 6.3 (H) <5.7 % of total Hgb Final    Comment:    For someone without known diabetes, a hemoglobin  A1c value between 5.7% and 6.4% is consistent with prediabetes and should be confirmed with a  follow-up test. . For someone with known diabetes, a value <7% indicates that their diabetes is well controlled. A1c targets should be individualized based on duration of diabetes, age, comorbid conditions, and other considerations. . This assay result is consistent with an increased risk of diabetes. . Currently, no consensus exists regarding use of hemoglobin A1c for diagnosis of diabetes for children. .          Failed - B12 Level in normal range and within 720 days    Vitamin B-12  Date Value Ref Range Status  09/26/2020 744 200 - 1,100 pg/mL Final         Failed - Valid encounter within last 6 months    Recent Outpatient Visits           2 months ago Syncope and collapse   [facility] [place], [place]              Passed - Cr in normal range and within 360 days    Creat  Date Value Ref Range Status  05/02/2023 0.81 0.60 - 1.00 mg/dL Final   Creatinine, Urine  Date Value Ref Range Status  05/02/2023 21 20 - 275 mg/dL Final         Passed - eGFR in normal range and within 360 days    GFR, Est African American  Date Value Ref Range Status  08/29/2020 78 > OR = 60 mL/min/1.68m2 Final   GFR, Est Non African American  Date Value Ref Range Status  08/29/2020 67 > OR = 60 mL/min/1.68m2 Final    eGFR  Date Value Ref Range Status  05/02/2023 74 > OR = 60 mL/min/1.46m2 Final         Passed - CBC within normal limits and completed in the last 12 months    WBC  Date Value Ref Range Status  05/02/2023 7.6 3.8 - 10.8 Thousand/uL Final   RBC  Date Value Ref Range Status  05/02/2023 4.15 3.80 - 5.10 Million/uL Final   Hemoglobin  Date Value Ref Range Status  05/02/2023 12.6 11.7 - 15.5 g/dL Final   HCT  Date Value Ref Range Status  05/02/2023 38.5 35.0 - 45.0 % Final   MCHC  Date Value Ref Range Status  05/02/2023 32.7 32.0 - 36.0 g/dL Final    Comment:    For adults, a slight decrease in the calculated MCHC value (in the range of 30 to 32 g/dL) is most likely not clinically significant; however, it should be interpreted with caution in correlation with other red cell parameters and the patient's clinical condition.    [facility]  Date Value Ref Range Status  05/02/2023 30.4 27.0 - 33.0 pg Final  MCV  Date Value Ref Range Status  05/02/2023 92.8 80.0 - 100.0 fL Final   No results found for: "PLTCOUNTKUC", "LABPLAT", "POCPLA" RDW  Date Value Ref Range Status  05/02/2023 11.9 11.0 - 15.0 % Final

## 2024-02-16 ENCOUNTER — Encounter: Payer: Self-pay | Admitting: Family Medicine

## 2024-02-16 ENCOUNTER — Ambulatory Visit (INDEPENDENT_AMBULATORY_CARE_PROVIDER_SITE_OTHER): Admitting: Family Medicine

## 2024-02-16 VITALS — BP 118/68 | HR 72 | Resp 16 | Ht 62.0 in | Wt 124.0 lb

## 2024-02-16 DIAGNOSIS — R21 Rash and other nonspecific skin eruption: Secondary | ICD-10-CM | POA: Diagnosis not present

## 2024-02-16 DIAGNOSIS — L308 Other specified dermatitis: Secondary | ICD-10-CM

## 2024-02-16 MED ORDER — PREDNISONE 10 MG PO TABS: ORAL_TABLET | ORAL | 0 refills | Status: DC

## 2024-02-16 MED ORDER — FAMOTIDINE 20 MG PO TABS: 20.0000 mg | ORAL_TABLET | Freq: Two times a day (BID) | ORAL | 1 refills | Status: DC | PRN

## 2024-02-16 NOTE — Patient Instructions (Addendum)
 Use hydrating creams once daily at least (like eucerin or ceraave) and also seal your skin with vaseline 1 to 2 x daily and also always after bathing or washing.  Avoid alcohol Avoid Dial soap Use only sensitive skin and dermatology approved soaps and lotions/creams  Eczema - large part of control is hydration with vaseline, especially after bathing, use mild soaps and detergents, seal skin after bathing, control allergy triggers and use topical steroids sparingly to affected areas/patches.  Can use twice a day for up to one week, as soon as severe areas of rash start to improve decrease use to once a day.  If not improved in 1-2 weeks, please follow up.  If any worsening, please follow up.  See handout below for further info  Atopic Dermatitis Atopic dermatitis is a skin disorder that causes inflammation of the skin. This is the most common type of eczema. Eczema is a group of skin conditions that cause the skin to be itchy, red, and swollen. This condition is generally worse during the cooler winter months and often improves during the warm summer months. Symptoms can vary from person to person. Atopic dermatitis usually starts showing signs in infancy and can last through adulthood. This condition cannot be passed from one person to another (non-contagious), but it is more common in families. Atopic dermatitis may not always be present. When it is present, it is called a flare-up. What are the causes? The exact cause of this condition is not known. Flare-ups of the condition may be triggered by: Contact with something that you are sensitive or allergic to. Stress. Certain foods. Extremely hot or cold weather. Harsh chemicals and soaps. Dry air. Chlorine. What increases the risk? This condition is more likely to develop in people who have a personal history or family history of eczema, allergies, asthma, or hay fever. What are the signs or symptoms? Symptoms of this condition  include: Dry, scaly skin. Red, itchy rash. Itchiness, which can be severe. This may occur before the skin rash. This can make sleeping difficult. Skin thickening and cracking that can occur over time. How is this diagnosed? This condition is diagnosed based on your symptoms, a medical history, and a physical exam. How is this treated? There is no cure for this condition, but symptoms can usually be controlled. Treatment focuses on: Controlling the itchiness and scratching. You may be given medicines, such as antihistamines or steroid creams. Limiting exposure to things that you are sensitive or allergic to (allergens). Recognizing situations that cause stress and developing a plan to manage stress. If your atopic dermatitis does not get better with medicines, or if it is all over your body (widespread), a treatment using a specific type of light (phototherapy) may be used. Follow these instructions at home: Skin care  Keep your skin well-moisturized. Doing this seals in moisture and helps to prevent dryness. Use unscented lotions that have petroleum in them. Avoid lotions that contain alcohol or water. They can dry the skin. Keep baths or showers short (less than 5 minutes) in warm water. Do not use hot water. Use mild, unscented cleansers for bathing. Avoid soap and bubble bath. Apply a moisturizer to your skin right after a bath or shower. Do not apply anything to your skin without checking with your health care provider. General instructions Dress in clothes made of cotton or cotton blends. Dress lightly because heat increases itchiness. When washing your clothes, rinse your clothes twice so all of the soap is removed. Avoid  any triggers that can cause a flare-up. Try to manage your stress. Keep your fingernails cut short. Avoid scratching. Scratching makes the rash and itchiness worse. It may also result in a skin infection (impetigo) due to a break in the skin caused by  scratching. Take or apply over-the-counter and prescription medicines only as told by your health care provider. Keep all follow-up visits as told by your health care provider. This is important. Do not be around people who have cold sores or fever blisters. If you get the infection, it may cause your atopic dermatitis to worsen. Contact a health care provider if: Your itchiness interferes with sleep. Your rash gets worse or it is not better within one week of starting treatment. You have a fever. You have a rash flare-up after having contact with someone who has cold sores or fever blisters. Get help right away if: You develop pus or soft yellow scabs in the rash area. Summary This condition causes a red rash and itchy, dry, scaly skin. Treatment focuses on controlling the itchiness and scratching, limiting exposure to things that you are sensitive or allergic to (allergens), recognizing situations that cause stress, and developing a plan to manage stress. Keep your skin well-moisturized. Keep baths or showers shorter than 5 minutes and use warm water. Do not use hot water. This information is not intended to replace advice given to you by your health care provider. Make sure you discuss any questions you have with your health care provider. Document Released: 07/16/2000 Document Revised: 11/07/2018 Document Reviewed: 08/20/2016 Elsevier Patient Education  2020 [employer].

## 2024-02-16 NOTE — Progress Notes (Signed)
 Patient ID: Leverne CHRISTELLA Brady, female    DOB: 1945/08/08, 78 y.o.   MRN: [id number]  PCP: Leavy Mole, PA-C  Chief Complaint  Patient presents with   Skin Itching    X1 week, all over body. Cortisone cream not helping. No new soaps or detergents. Has poison ivy around her house.     Subjective:   Kiara Brady is a 78 y.o. female, presents to clinic with CC of the following:  HPI  Recurrent rash itching with use of many chemicals on skin and around home.  Has not followed medical recommendations previously reviewed  Discussed the use of AI scribe software for clinical note transcription with the patient, who gave verbal consent to proceed.  History of Present Illness Kiara Brady is a 78 year old female with type 2 diabetes who presents with a rash and itching.  Pruritic rash - Rash with associated pruritus and swelling, onset after washing feet last night (though hx of this same rash, chronic and recurrent) - Rash expanded after use of Dial soap and alcohol on skin, scratched arms and legs and woke up with worse rash and some swelling - Alcohol provides temporary relief of pruritus but causes skin dryness - Hydrocortisone  cream used with partial relief  - pt is itchy at home, worried about dust and ticks on dogs and sprays antibiotic sprays all over home and house. She asks what cleaning supplies we use here because when she is here at the office she does not itch and it smells clean - Sensitivity to antipruritic medications due to drowsiness as a side effect - Concern for possible contact with poison ivy and ticks due to environment with many plants and trees - Frequently checks dog for ticks and fleas; uses disinfectants in home  Medication allergies and sensitivities - Allergic to multiple medications, including prednisone  - Sensitivity to medications for itching due to drowsiness      Patient Active Problem List   Diagnosis Date Noted   Advanced care  planning/counseling discussion 05/02/2023   Mild cognitive impairment 09/26/2020   Osteoporosis 02/06/2018   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   Osteoarthritis of knees, 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   Hyperlipidemia 03/23/2016   Cardiac murmur, previously undiagnosed 01/23/2016   Heart murmur 09/03/2015   Chronic pain of both knees 06/30/2015   Dyslipidemia associated with type 2 diabetes mellitus (HCC) 02/26/2015   Diabetes mellitus, type 2 (HCC) 01/06/2015      Current Outpatient Medications:    atorvastatin  (LIPITOR) 10 MG tablet, TAKE 1 TABLET BY MOUTH AT BEDTIME, Disp: 90 tablet, Rfl: 0   Cholecalciferol  (VITAMIN D3) 10 MCG (400 UNIT) tablet, Take 400 Units by mouth daily., Disp: , Rfl:    famotidine  (PEPCID ) 20 MG tablet, Take 1 tablet (20 mg total) by mouth 2 (two) times daily as needed (itching/rash)., Disp: 60 tablet, Rfl: 1   hydrocortisone  1 % ointment, Apply 1 Application topically 2 (two) times daily., Disp: 30 g, Rfl: 0   metFORMIN  (GLUCOPHAGE -XR) 500 MG 24 hr tablet, TAKE 1 TABLET BY MOUTH TWICE DAILY WITH A MEAL, Disp: 180 tablet, Rfl: 0   predniSONE  (DELTASONE ) 10 MG tablet, Take 30 mg (3 tabs) poqam x 3 d, take 20 mg (2 tabs) poqam x 3d 10 mg (1 tab) poqam x 4 d take with food, Disp: 19 tablet, Rfl: 0   triamcinolone  (KENALOG ) 0.025 % ointment, Apply 1 Application topically 2 (two) times daily as needed (rash/itching to intact  skin)., Disp: 30 g, Rfl: 0   Allergies  Allergen Reactions   Oxycodone Other (See Comments)   Penicillin G Hives and Shortness Of Breath   Protonix  [Pantoprazole  Sodium] Shortness Of Breath   Other Other (See Comments)   Penicillins    Prednisone  Cough   Vitamin D  Analogs     Chest pain   Xanax [Alprazolam]     hyperactivity   Fosamax  [Alendronate  Sodium] Nausea Only     Social History   Tobacco Use   Smoking status: Former   Smokeless tobacco: Never  Vaping Use   Vaping status: Never Used  Substance Use  Topics   Alcohol use: No   Drug use: No      Chart Review Today: I personally reviewed active problem list, medication list, allergies, family history, social history, health maintenance, notes from last encounter, lab results, imaging with the patient/caregiver today.   Review of Systems  Constitutional: Negative.   HENT: Negative.    Eyes: Negative.   Respiratory: Negative.  Negative for chest tightness, shortness of breath and wheezing.   Cardiovascular: Negative.   Gastrointestinal: Negative.   Endocrine: Negative.   Genitourinary: Negative.   Musculoskeletal: Negative.   Skin: Negative.   Allergic/Immunologic: Negative.   Neurological: Negative.   Hematological: Negative.   Psychiatric/Behavioral: Negative.    All other systems reviewed and are negative.      Objective:   Vitals:   02/16/24 0943  BP: 118/68  Pulse: 72  Resp: 16  SpO2: 98%  Weight: 124 lb (56.2 kg)  Height: 5' 2 (1.575 m)    Body mass index is 22.68 kg/m.  Physical Exam Vitals and nursing note reviewed.  Constitutional:      General: She is not in acute distress.    Appearance: Normal appearance. She is well-developed. She is not ill-appearing, toxic-appearing or diaphoretic.  HENT:     Head: Normocephalic and atraumatic.     Right Ear: External ear normal.     Left Ear: External ear normal.     Nose: Nose normal.  Eyes:     General: No scleral icterus.       Right eye: No discharge.        Left eye: No discharge.     Conjunctiva/sclera: Conjunctivae normal.  Neck:     Trachea: No tracheal deviation.  Cardiovascular:     Rate and Rhythm: Normal rate.  Pulmonary:     Effort: Pulmonary effort is normal. No respiratory distress.     Breath sounds: No stridor.  Skin:    General: Skin is warm and dry.     Findings: Rash present.     Comments: Excoriations all over bilateral arms, legs and feet with some redness, mild swelling, and diffusely dry skin (see photos)  Neurological:      Mental Status: She is alert. Mental status is at baseline.     Motor: No abnormal muscle tone.  Psychiatric:        Mood and Affect: Mood normal.        Behavior: Behavior normal.           Results for orders placed or performed in visit on 05/02/23  Urine Microalbumin w/creat. ratio   Collection Time: 05/02/23 11:50 AM  Result Value Ref Range   Creatinine, Urine 21 20 - 275 mg/dL   Microalb, Ur 0.3 mg/dL   Microalb Creat Ratio 14 <30 mg/g creat  Hemoglobin A1C   Collection Time: 05/02/23 11:50 AM  Result  Value Ref Range   Hgb A1c MFr Bld 6.3 (H) <5.7 % of total Hgb   Mean Plasma Glucose 134 mg/dL   eAG (mmol/L) 7.4 mmol/L  CBC with Differential   Collection Time: 05/02/23 11:50 AM  Result Value Ref Range   WBC 7.6 3.8 - 10.8 Thousand/uL   RBC 4.15 3.80 - 5.10 Million/uL   Hemoglobin 12.6 11.7 - 15.5 g/dL   HCT 61.4 64.9 - 54.9 %   MCV 92.8 80.0 - 100.0 fL   MCH 30.4 27.0 - 33.0 pg   MCHC 32.7 32.0 - 36.0 g/dL   RDW 88.0 88.9 - 84.9 %   Platelets 397 140 - 400 Thousand/uL   MPV 9.4 7.5 - 12.5 fL   Neutro Abs 4,788 1,500 - 7,800 cells/uL   Lymphs Abs 1,984 850 - 3,900 cells/uL   Absolute Monocytes 517 200 - 950 cells/uL   Eosinophils Absolute 281 15 - 500 cells/uL   Basophils Absolute 30 0 - 200 cells/uL   Neutrophils Relative % 63 %   Total Lymphocyte 26.1 %   Monocytes Relative 6.8 %   Eosinophils Relative 3.7 %   Basophils Relative 0.4 %  COMPLETE METABOLIC PANEL WITH GFR   Collection Time: 05/02/23 11:50 AM  Result Value Ref Range   Glucose, Bld 91 65 - 99 mg/dL   BUN 10 7 - 25 mg/dL   Creat 9.18 9.39 - 8.99 mg/dL   eGFR 74 > OR = 60 fO/fpw/8.26f7   BUN/Creatinine Ratio SEE NOTE: 6 - 22 (calc)   Sodium 141 135 - 146 mmol/L   Potassium 3.9 3.5 - 5.3 mmol/L   Chloride 102 98 - 110 mmol/L   CO2 27 20 - 32 mmol/L   Calcium  9.7 8.6 - 10.4 mg/dL   Total Protein 7.3 6.1 - 8.1 g/dL   Albumin 4.4 3.6 - 5.1 g/dL   Globulin 2.9 1.9 - 3.7 g/dL (calc)   AG Ratio  1.5 1.0 - 2.5 (calc)   Total Bilirubin 0.9 0.2 - 1.2 mg/dL   Alkaline phosphatase (APISO) 86 37 - 153 U/L   AST 14 10 - 35 U/L   ALT 8 6 - 29 U/L  Lipid Profile   Collection Time: 05/02/23 11:50 AM  Result Value Ref Range   Cholesterol 167 <200 mg/dL   HDL 81 > OR = 50 mg/dL   Triglycerides 75 <849 mg/dL   LDL Cholesterol (Calc) 70 mg/dL (calc)   Total CHOL/HDL Ratio 2.1 <5.0 (calc)   Non-HDL Cholesterol (Calc) 86 <869 mg/dL (calc)       Assessment & Plan:   1. Pruritic dermatitis (Primary) - famotidine  to see if it would hlep with itching since she will not take zyrtec /claritin  or other antihistamines - predniSONE  (DELTASONE ) 10 MG tablet; Take 30 mg (3 tabs) poqam x 3 d, take 20 mg (2 tabs) poqam x 3d 10 mg (1 tab) poqam x 4 d take with food  Dispense: 19 tablet; Refill: 0  2. Rash and nonspecific skin eruption See below - predniSONE  (DELTASONE ) 10 MG tablet; Take 30 mg (3 tabs) poqam x 3 d, take 20 mg (2 tabs) poqam x 3d 10 mg (1 tab) poqam x 4 d take with food  Dispense: 19 tablet; Refill: 0   Assessment & Plan Dermatitis Likely contact dermatitis or eczema, exacerbated by dial soap and alcohol use. Sensitive skin complicates treatment. - Prescribed low-dose oral steroid for a few days with informed consent on side effects. - Advised calamine lotion for  soothing and avoid alcohol, frequent washing of skin with strong soaps, and avoid frequent contact with cleaning chemicals in home. - Recommended creams like eucerin/cerave for dry skin/sensitive skin and also use Vaseline/petroleum jelly for hydration. - Instructed to discontinue dial soap and use sensitive skin products. - Consider dermatology referral if no improvement. - Some of itching/scratching may be psychosomatic - excessive cleaning in her home and fear of several things that would cause itching, however in the office she states she has no itching and it smells clean in the office - she wanted to know what we use to  clean  - med SE may limit ability to tx - she wont use antihistamines due to drowsiness.  Instructions written out for her on AVS   Follow-up Follow-up to assess treatment effectiveness. - Schedule follow-up in one week to reassess skin condition and treatment efficacy.  Recording duration: 17 minutes       Michelene Cower, PA-C 02/16/24 10:37 AM

## 2024-02-23 ENCOUNTER — Ambulatory Visit (INDEPENDENT_AMBULATORY_CARE_PROVIDER_SITE_OTHER): Admitting: Nurse Practitioner

## 2024-02-23 ENCOUNTER — Encounter: Payer: Self-pay | Admitting: Nurse Practitioner

## 2024-02-23 VITALS — BP 116/70 | HR 74 | Temp 97.9°F | Resp 18 | Ht 62.0 in | Wt 119.9 lb

## 2024-02-23 DIAGNOSIS — L308 Other specified dermatitis: Secondary | ICD-10-CM

## 2024-02-23 DIAGNOSIS — R21 Rash and other nonspecific skin eruption: Secondary | ICD-10-CM

## 2024-02-23 DIAGNOSIS — R0789 Other chest pain: Secondary | ICD-10-CM

## 2024-02-23 MED ORDER — TRIAMCINOLONE ACETONIDE 0.1 % EX CREA: 1.0000 | TOPICAL_CREAM | Freq: Two times a day (BID) | CUTANEOUS | 0 refills | Status: DC

## 2024-02-23 MED ORDER — TIZANIDINE HCL 4 MG PO TABS: 4.0000 mg | ORAL_TABLET | Freq: Every day | ORAL | 0 refills | Status: DC

## 2024-02-23 MED ORDER — CETIRIZINE HCL 10 MG PO TABS: 10.0000 mg | ORAL_TABLET | Freq: Every day | ORAL | 0 refills | Status: DC

## 2024-02-23 NOTE — Patient Instructions (Addendum)
 Dove sensitive soap Cerave lotion

## 2024-02-23 NOTE — Progress Notes (Signed)
 BP 116/70   Pulse 74   Temp 97.9 F (36.6 C)   Resp 18   Ht 5' 2 (1.575 m)   Wt 119 lb 14.4 oz (54.4 kg)   SpO2 94%   BMI 21.93 kg/m    Subjective:    Patient ID: Kiara Brady, female    DOB: Jul 15, 1946, 78 y.o.   MRN: [id number]  HPI: Kiara Brady is a 78 y.o. female presenting today for a 1 week follow-up to evaluate her rash and itchiness to skin. Patient was seen on 02/16/24 for sx of contact dermatitis, exacerbated by used of dial soap and yard work. Patient was placed on a low dose steroid and famotidine . Patient reports that the rash and itchiness has not improved. Patient has been taking the medications as prescribed with no side effects. Patient reports she is still using dial soap. Patient reports yesterday she was doing yard work and skin was itchy upon returning inside.    Patient also reports chest wall pain after mowing the lawn yesterday. Patient reports that this pain comes on when moving a certain way, but is relieved by no movement. Patient denies SOB. Offered EKG for patient, but she refused.          02/23/2024    9:19 AM 08/02/2023    9:29 AM 05/02/2023   10:46 AM  Depression screen PHQ 2/9  Decreased Interest 0 0 0  Down, Depressed, Hopeless 0 0 0  PHQ - 2 Score 0 0 0  Altered sleeping 0  0  Tired, decreased energy 0  0  Change in appetite 0  0  Feeling bad or failure about yourself  0  0  Trouble concentrating 0  0  Moving slowly or fidgety/restless 0  0  Suicidal thoughts 0  0  PHQ-9 Score 0  0  Difficult doing work/chores Not difficult at all      Relevant past medical, surgical, family and social history reviewed and updated as indicated. Interim medical history since our last visit reviewed. Allergies and medications reviewed and updated.  Review of Systems Constitutional: Negative for fever or weight change.  Respiratory: Negative for cough and shortness of breath.   Cardiovascular:Positive for chest wall pain. Denies palpitations and  shortness of breath. Gastrointestinal: Negative for abdominal pain, no bowel changes.  Musculoskeletal: Negative for gait problem or joint swelling.  Skin:Positive for rash and pruritus  Neurological: Negative for dizziness or headache.  No other specific complaints in a complete review of systems (except as listed in HPI above).      Objective:     BP 116/70   Pulse 74   Temp 97.9 F (36.6 C)   Resp 18   Ht 5' 2 (1.575 m)   Wt 119 lb 14.4 oz (54.4 kg)   SpO2 94%   BMI 21.93 kg/m    Wt Readings from Last 3 Encounters:  02/23/24 119 lb 14.4 oz (54.4 kg)  02/16/24 124 lb (56.2 kg)  11/08/23 119 lb 9.6 oz (54.3 kg)    Physical Exam Constitutional:      Appearance: Normal appearance.  HENT:     Head: Normocephalic and atraumatic.  Cardiovascular:     Rate and Rhythm: Normal rate and regular rhythm.     Pulses: Normal pulses.     Heart sounds: Normal heart sounds.  Pulmonary:     Effort: Pulmonary effort is normal.     Breath sounds: Normal breath sounds.  Musculoskeletal:  General: Normal range of motion.     Cervical back: Normal range of motion and neck supple.  Skin:    General: Skin is warm and dry.     Findings: Erythema and rash present.  Neurological:     General: No focal deficit present.     Mental Status: She is alert and oriented to person, place, and time.  Psychiatric:        Mood and Affect: Mood normal.        Behavior: Behavior normal.      Results for orders placed or performed in visit on 05/02/23  Urine Microalbumin w/creat. ratio   Collection Time: 05/02/23 11:50 AM  Result Value Ref Range   Creatinine, Urine 21 20 - 275 mg/dL   Microalb, Ur 0.3 mg/dL   Microalb Creat Ratio 14 <30 mg/g creat  Hemoglobin A1C   Collection Time: 05/02/23 11:50 AM  Result Value Ref Range   Hgb A1c MFr Bld 6.3 (H) <5.7 % of total Hgb   Mean Plasma Glucose 134 mg/dL   eAG (mmol/L) 7.4 mmol/L  CBC with Differential   Collection Time: 05/02/23 11:50  AM  Result Value Ref Range   WBC 7.6 3.8 - 10.8 Thousand/uL   RBC 4.15 3.80 - 5.10 Million/uL   Hemoglobin 12.6 11.7 - 15.5 g/dL   HCT 61.4 64.9 - 54.9 %   MCV 92.8 80.0 - 100.0 fL   MCH 30.4 27.0 - 33.0 pg   MCHC 32.7 32.0 - 36.0 g/dL   RDW 88.0 88.9 - 84.9 %   Platelets 397 140 - 400 Thousand/uL   MPV 9.4 7.5 - 12.5 fL   Neutro Abs 4,788 1,500 - 7,800 cells/uL   Lymphs Abs 1,984 850 - 3,900 cells/uL   Absolute Monocytes 517 200 - 950 cells/uL   Eosinophils Absolute 281 15 - 500 cells/uL   Basophils Absolute 30 0 - 200 cells/uL   Neutrophils Relative % 63 %   Total Lymphocyte 26.1 %   Monocytes Relative 6.8 %   Eosinophils Relative 3.7 %   Basophils Relative 0.4 %  COMPLETE METABOLIC PANEL WITH GFR   Collection Time: 05/02/23 11:50 AM  Result Value Ref Range   Glucose, Bld 91 65 - 99 mg/dL   BUN 10 7 - 25 mg/dL   Creat 9.18 9.39 - 8.99 mg/dL   eGFR 74 > OR = 60 fO/fpw/8.26f7   BUN/Creatinine Ratio SEE NOTE: 6 - 22 (calc)   Sodium 141 135 - 146 mmol/L   Potassium 3.9 3.5 - 5.3 mmol/L   Chloride 102 98 - 110 mmol/L   CO2 27 20 - 32 mmol/L   Calcium  9.7 8.6 - 10.4 mg/dL   Total Protein 7.3 6.1 - 8.1 g/dL   Albumin 4.4 3.6 - 5.1 g/dL   Globulin 2.9 1.9 - 3.7 g/dL (calc)   AG Ratio 1.5 1.0 - 2.5 (calc)   Total Bilirubin 0.9 0.2 - 1.2 mg/dL   Alkaline phosphatase (APISO) 86 37 - 153 U/L   AST 14 10 - 35 U/L   ALT 8 6 - 29 U/L  Lipid Profile   Collection Time: 05/02/23 11:50 AM  Result Value Ref Range   Cholesterol 167 <200 mg/dL   HDL 81 > OR = 50 mg/dL   Triglycerides 75 <849 mg/dL   LDL Cholesterol (Calc) 70 mg/dL (calc)   Total CHOL/HDL Ratio 2.1 <5.0 (calc)   Non-HDL Cholesterol (Calc) 86 <869 mg/dL (calc)  Assessment & Plan:   Problem List Items Addressed This Visit   None Visit Diagnoses       Pruritic dermatitis    -  Primary   Rx sent in for Kenalog  cream and zyrtec . Advised to change soaps.   Relevant Medications   triamcinolone  cream  (KENALOG ) 0.1 %   cetirizine  (ZYRTEC ) 10 MG tablet     Rash and nonspecific skin eruption       Continue with current medications. Begin using Kenalog  cream to affected areas. Begin takin zyrtec  at night before bed.   Relevant Medications   triamcinolone  cream (KENALOG ) 0.1 %   cetirizine  (ZYRTEC ) 10 MG tablet     Pain, chest wall       Advised warm compresses. Begin taking zanaflex  at night as needed for pain. Patient declined EKG.   Relevant Medications   tiZANidine  (ZANAFLEX ) 4 MG tablet        Assessment and Plan: -Discussed stopping the dial soap and using Dove Sensitive soap instead -Educated about the importance of moisturizing body after showering. Recommended Cerave lotion.  -Continue taking low dose steroid  -Continue taking the Famotidine  -Begin taking the zyrtec  at night (We discussed taking it at bedtime due to the potential of making her sleepy) -For chest wall pain, prescribed Zanaflex  as needed for musculoskeletal pain. We discussed taking this medication at bedtime due to potential for sleepiness.  -Patient refused EKG in office to rule out other causes of chest pain.  -Consider dermatology referral if symptoms not improving.          Follow up plan: Return if symptoms worsen or fail to improve.    I have reviewed this encounter including the documentation in this note and/or discussed this patient with the provider, Aislinn Womack, SNP, I am certifying that I agree with the content of this note as supervising/preceptor nurse practitioner.  Mliss Spray, FNP-C [facility] 02/23/2024, 10:20 AM

## 2024-03-06 ENCOUNTER — Encounter: Payer: Self-pay | Admitting: Nurse Practitioner

## 2024-03-06 ENCOUNTER — Ambulatory Visit (INDEPENDENT_AMBULATORY_CARE_PROVIDER_SITE_OTHER): Admitting: Nurse Practitioner

## 2024-03-06 VITALS — BP 116/66 | HR 72 | Resp 16 | Ht 62.0 in | Wt 122.4 lb

## 2024-03-06 DIAGNOSIS — N644 Mastodynia: Secondary | ICD-10-CM | POA: Diagnosis not present

## 2024-03-06 NOTE — Progress Notes (Signed)
 BP 116/66   Pulse 72   Resp 16   Ht 5' 2 (1.575 m)   Wt 122 lb 6.4 oz (55.5 kg)   SpO2 98%   BMI 22.39 kg/m    Subjective:    Patient ID: Kiara Brady, female    DOB: 10/22/1945, 78 y.o.   MRN: [id number]  HPI: Kiara Brady is a 78 y.o. female  Chief Complaint  Patient presents with   Breast Pain    Right and left     Discussed the use of AI scribe software for clinical note transcription with the patient, who gave verbal consent to proceed.  History of Present Illness Kiara Brady is a 78 year old female who presents with bilateral breast pain.  Bilateral breast pain - Onset since 03/04/2024 - Initially more pronounced in the right breast, later involving the left breast - Pain described as sensitive and tender, sometimes persistent - Pain subsided with sleep last night  Breast cancer screening - Last mammogram performed on March 21, 2023, was negative - Requests repeat mammogram due to recent onset of breast pain         02/23/2024    9:19 AM 08/02/2023    9:29 AM 05/02/2023   10:46 AM  Depression screen PHQ 2/9  Decreased Interest 0 0 0  Down, Depressed, Hopeless 0 0 0  PHQ - 2 Score 0 0 0  Altered sleeping 0  0  Tired, decreased energy 0  0  Change in appetite 0  0  Feeling bad or failure about yourself  0  0  Trouble concentrating 0  0  Moving slowly or fidgety/restless 0  0  Suicidal thoughts 0  0  PHQ-9 Score 0  0  Difficult doing work/chores Not difficult at all      Relevant past medical, surgical, family and social history reviewed and updated as indicated. Interim medical history since our last visit reviewed. Allergies and medications reviewed and updated.  Review of Systems  Ten systems reviewed and is negative except as mentioned in HPI      Objective:     BP 116/66   Pulse 72   Resp 16   Ht 5' 2 (1.575 m)   Wt 122 lb 6.4 oz (55.5 kg)   SpO2 98%   BMI 22.39 kg/m    Wt Readings from Last 3 Encounters:  03/06/24  122 lb 6.4 oz (55.5 kg)  02/23/24 119 lb 14.4 oz (54.4 kg)  02/16/24 124 lb (56.2 kg)    Physical Exam Physical Exam GENERAL: Alert, cooperative, well developed, no acute distress. HEENT: Normocephalic, normal oropharynx, moist mucous membranes. CHEST: Clear to auscultation bilaterally, no wheezes, rhonchi, or crackles. CARDIOVASCULAR: Normal heart rate and rhythm, S1 and S2 normal without murmurs. BREAST: Breasts without masses or skin changes. ABDOMEN: Soft, non-tender, non-distended, without organomegaly, normal bowel sounds. EXTREMITIES: No cyanosis or edema. NEUROLOGICAL: Cranial nerves grossly intact, moves all extremities without gross motor or sensory deficit.   Results for orders placed or performed in visit on 05/02/23  Urine Microalbumin w/creat. ratio   Collection Time: 05/02/23 11:50 AM  Result Value Ref Range   Creatinine, Urine 21 20 - 275 mg/dL   Microalb, Ur 0.3 mg/dL   Microalb Creat Ratio 14 <30 mg/g creat  Hemoglobin A1C   Collection Time: 05/02/23 11:50 AM  Result Value Ref Range   Hgb A1c MFr Bld 6.3 (H) <5.7 % of total Hgb   Mean Plasma  Glucose 134 mg/dL   eAG (mmol/L) 7.4 mmol/L  CBC with Differential   Collection Time: 05/02/23 11:50 AM  Result Value Ref Range   WBC 7.6 3.8 - 10.8 Thousand/uL   RBC 4.15 3.80 - 5.10 Million/uL   Hemoglobin 12.6 11.7 - 15.5 g/dL   HCT 61.4 64.9 - 54.9 %   MCV 92.8 80.0 - 100.0 fL   MCH 30.4 27.0 - 33.0 pg   MCHC 32.7 32.0 - 36.0 g/dL   RDW 88.0 88.9 - 84.9 %   Platelets 397 140 - 400 Thousand/uL   MPV 9.4 7.5 - 12.5 fL   Neutro Abs 4,788 1,500 - 7,800 cells/uL   Lymphs Abs 1,984 850 - 3,900 cells/uL   Absolute Monocytes 517 200 - 950 cells/uL   Eosinophils Absolute 281 15 - 500 cells/uL   Basophils Absolute 30 0 - 200 cells/uL   Neutrophils Relative % 63 %   Total Lymphocyte 26.1 %   Monocytes Relative 6.8 %   Eosinophils Relative 3.7 %   Basophils Relative 0.4 %  COMPLETE METABOLIC PANEL WITH GFR    Collection Time: 05/02/23 11:50 AM  Result Value Ref Range   Glucose, Bld 91 65 - 99 mg/dL   BUN 10 7 - 25 mg/dL   Creat 9.18 9.39 - 8.99 mg/dL   eGFR 74 > OR = 60 fO/fpw/8.26f7   BUN/Creatinine Ratio SEE NOTE: 6 - 22 (calc)   Sodium 141 135 - 146 mmol/L   Potassium 3.9 3.5 - 5.3 mmol/L   Chloride 102 98 - 110 mmol/L   CO2 27 20 - 32 mmol/L   Calcium  9.7 8.6 - 10.4 mg/dL   Total Protein 7.3 6.1 - 8.1 g/dL   Albumin 4.4 3.6 - 5.1 g/dL   Globulin 2.9 1.9 - 3.7 g/dL (calc)   AG Ratio 1.5 1.0 - 2.5 (calc)   Total Bilirubin 0.9 0.2 - 1.2 mg/dL   Alkaline phosphatase (APISO) 86 37 - 153 U/L   AST 14 10 - 35 U/L   ALT 8 6 - 29 U/L  Lipid Profile   Collection Time: 05/02/23 11:50 AM  Result Value Ref Range   Cholesterol 167 <200 mg/dL   HDL 81 > OR = 50 mg/dL   Triglycerides 75 <849 mg/dL   LDL Cholesterol (Calc) 70 mg/dL (calc)   Total CHOL/HDL Ratio 2.1 <5.0 (calc)   Non-HDL Cholesterol (Calc) 86 <869 mg/dL (calc)          Assessment & Plan:   Problem List Items Addressed This Visit   None Visit Diagnoses       Breast pain    -  Primary   Relevant Orders   MM 3D DIAGNOSTIC MAMMOGRAM BILATERAL BREAST   US  LIMITED ULTRASOUND INCLUDING AXILLA LEFT BREAST    US  LIMITED ULTRASOUND INCLUDING AXILLA RIGHT BREAST        Assessment and Plan Assessment & Plan Breast pain (Mastodynia) Bilateral breast pain with recent onset since February 22, 2024, initially in the right breast, later in the left. No palpable masses or skin changes on examination. Differential diagnosis includes musculoskeletal pain. Previous mammogram on March 21, 2023, was negative. Diagnostic imaging is warranted to rule out underlying pathology. - Order diagnostic mammogram. - Order breast ultrasound. - Instruct her to schedule the diagnostic imaging at the facility.        Follow up plan: Return if symptoms worsen or fail to improve.

## 2024-03-07 ENCOUNTER — Ambulatory Visit: Admitting: Nurse Practitioner

## 2024-03-08 ENCOUNTER — Ambulatory Visit
Admission: RE | Admit: 2024-03-08 | Discharge: 2024-03-08 | Disposition: A | Discharge status: Home / Self Care | Source: Ambulatory Visit | Attending: Nurse Practitioner | Admitting: Nurse Practitioner

## 2024-03-08 ENCOUNTER — Inpatient Hospital Stay
Admission: RE | Admit: 2024-03-08 | Discharge: 2024-03-08 | Discharge status: Home / Self Care | Source: Ambulatory Visit | Attending: Nurse Practitioner

## 2024-03-08 DIAGNOSIS — N644 Mastodynia: Secondary | ICD-10-CM

## 2024-03-08 DIAGNOSIS — R92333 Mammographic heterogeneous density, bilateral breasts: Secondary | ICD-10-CM | POA: Diagnosis not present

## 2024-03-12 ENCOUNTER — Other Ambulatory Visit: Payer: Self-pay | Admitting: Family Medicine

## 2024-03-12 DIAGNOSIS — E1169 Type 2 diabetes mellitus with other specified complication: Secondary | ICD-10-CM

## 2024-03-12 DIAGNOSIS — E1165 Type 2 diabetes mellitus with hyperglycemia: Secondary | ICD-10-CM

## 2024-03-12 DIAGNOSIS — E119 Type 2 diabetes mellitus without complications: Secondary | ICD-10-CM

## 2024-03-12 NOTE — Telephone Encounter (Signed)
 Copied from CRM [id number]. Topic: Clinical - Medication Refill >> Mar 12, 2024  2:35 PM Wess RAMAN wrote: Medication: metFORMIN  (GLUCOPHAGE -XR) 500 MG 24 hr tablet  atorvastatin  (LIPITOR) 10 MG tablet   Has the patient contacted their pharmacy? No (Agent: If no, request that the patient contact the pharmacy for the refill. If patient does not wish to contact the pharmacy document the reason why and proceed with request.) (Agent: If yes, when and what did the pharmacy advise?)  This is the patient's preferred pharmacy:  [facility] [address], [place] - [address] [employer] [place] [address] Phone: [phone] Fax: [phone]  Is this the correct pharmacy for this prescription? Yes If no, delete pharmacy and type the correct one.   Has the prescription been filled recently? Yes  Is the patient out of the medication? No  Has the patient been seen for an appointment in the last year OR does the patient have an upcoming appointment? Yes  Can we respond through MyChart? No  Agent: Please be advised that Rx refills may take up to 3 business days. We ask that you follow-up with your pharmacy.

## 2024-04-04 ENCOUNTER — Other Ambulatory Visit: Payer: Self-pay | Admitting: Family Medicine

## 2024-04-04 DIAGNOSIS — E1169 Type 2 diabetes mellitus with other specified complication: Secondary | ICD-10-CM

## 2024-04-04 DIAGNOSIS — E119 Type 2 diabetes mellitus without complications: Secondary | ICD-10-CM

## 2024-04-04 NOTE — Telephone Encounter (Signed)
 Requested Prescriptions  Pending Prescriptions Disp Refills   atorvastatin  (LIPITOR) 10 MG tablet [Pharmacy Med Name: Atorvastatin  Calcium  10 MG Oral Tablet] 90 tablet 0    Sig: TAKE 1 TABLET BY MOUTH AT BEDTIME     Cardiovascular:  Antilipid - Statins Failed - 04/04/2024  3:27 PM      Failed - Lipid Panel in normal range within the last 12 months    Cholesterol, Total  Date Value Ref Range Status  09/08/2015 166 100 - 199 mg/dL Final   Cholesterol  Date Value Ref Range Status  05/02/2023 167 <200 mg/dL Final   LDL Cholesterol (Calc)  Date Value Ref Range Status  05/02/2023 70 mg/dL (calc) Final    Comment:    Reference range: <100 . Desirable range <100 mg/dL for primary prevention;   <70 mg/dL for patients with CHD or diabetic patients  with > or = 2 CHD risk factors. SABRA LDL-C is now calculated using the Martin-Hopkins  calculation, which is a validated novel method providing  better accuracy than the Friedewald equation in the  estimation of LDL-C.  Gladis APPLETHWAITE et al. SANDREA. 7986;689(80): 2061-2068  (http://education.[facility].com/faq/FAQ164)    HDL  Date Value Ref Range Status  05/02/2023 81 > OR = 50 mg/dL Final  97/93/7982 68 >60 mg/dL Final   Triglycerides  Date Value Ref Range Status  05/02/2023 75 <150 mg/dL Final         Passed - Patient is not pregnant      Passed - Valid encounter within last 12 months    Recent Outpatient Visits           4 weeks ago Breast pain   [facility] Gareth Mliss FALCON, FNP   1 month ago Pruritic dermatitis   [facility] Gareth Mliss FALCON, FNP   1 month ago Pruritic dermatitis   [facility] Leavy Mole, PA-C   4 months ago Syncope and collapse   [facility] Bernardo Fend, [place]

## 2024-04-16 ENCOUNTER — Other Ambulatory Visit: Payer: Self-pay | Admitting: Family Medicine

## 2024-04-16 DIAGNOSIS — E1169 Type 2 diabetes mellitus with other specified complication: Secondary | ICD-10-CM

## 2024-04-16 DIAGNOSIS — E1165 Type 2 diabetes mellitus with hyperglycemia: Secondary | ICD-10-CM

## 2024-04-17 NOTE — Telephone Encounter (Signed)
 Requested Prescriptions  Pending Prescriptions Disp Refills   metFORMIN  (GLUCOPHAGE -XR) 500 MG 24 hr tablet [Pharmacy Med Name: metFORMIN  HCl ER 500 MG Oral Tablet Extended Release 24 Hour] 180 tablet 0    Sig: TAKE 1 TABLET BY MOUTH TWICE DAILY WITH A MEAL     Endocrinology:  Diabetes - Biguanides Failed - 04/17/2024  4:09 PM      Failed - HBA1C is between 0 and 7.9 and within 180 days    Hgb A1c MFr Bld  Date Value Ref Range Status  05/02/2023 6.3 (H) <5.7 % of total Hgb Final    Comment:    For someone without known diabetes, a hemoglobin  A1c value between 5.7% and 6.4% is consistent with prediabetes and should be confirmed with a  follow-up test. . For someone with known diabetes, a value <7% indicates that their diabetes is well controlled. A1c targets should be individualized based on duration of diabetes, age, comorbid conditions, and other considerations. . This assay result is consistent with an increased risk of diabetes. . Currently, no consensus exists regarding use of hemoglobin A1c for diagnosis of diabetes for children. .          Failed - B12 Level in normal range and within 720 days    Vitamin B-12  Date Value Ref Range Status  09/26/2020 744 200 - 1,100 pg/mL Final         Passed - Cr in normal range and within 360 days    Creat  Date Value Ref Range Status  05/02/2023 0.81 0.60 - 1.00 mg/dL Final   Creatinine, Urine  Date Value Ref Range Status  05/02/2023 21 20 - 275 mg/dL Final         Passed - eGFR in normal range and within 360 days    GFR, Est African American  Date Value Ref Range Status  08/29/2020 78 > OR = 60 mL/min/1.75m2 Final   GFR, Est Non African American  Date Value Ref Range Status  08/29/2020 67 > OR = 60 mL/min/1.17m2 Final   eGFR  Date Value Ref Range Status  05/02/2023 74 > OR = 60 mL/min/1.39m2 Final         Passed - Valid encounter within last 6 months    Recent Outpatient Visits           1 month ago Breast  pain   [facility] Gareth Clarity F, FNP   1 month ago Pruritic dermatitis   [facility] Gareth Clarity FALCON, FNP   2 months ago Pruritic dermatitis   [facility] Leavy Mole, PA-C   5 months ago Syncope and collapse   [facility] Bernardo Fend, [place]              Passed - CBC within normal limits and completed in the last 12 months    WBC  Date Value Ref Range Status  05/02/2023 7.6 3.8 - 10.8 Thousand/uL Final   RBC  Date Value Ref Range Status  05/02/2023 4.15 3.80 - 5.10 Million/uL Final   Hemoglobin  Date Value Ref Range Status  05/02/2023 12.6 11.7 - 15.5 g/dL Final   HCT  Date Value Ref Range Status  05/02/2023 38.5 35.0 - 45.0 % Final   MCHC  Date Value Ref Range Status  05/02/2023 32.7 32.0 - 36.0 g/dL Final    Comment:    For adults, a slight decrease in the calculated MCHC  value (in the range of 30 to 32 g/dL) is most likely not clinically significant; however, it should be interpreted with caution in correlation with other red cell parameters and the patient's clinical condition.    [facility]  Date Value Ref Range Status  05/02/2023 30.4 27.0 - 33.0 pg Final   MCV  Date Value Ref Range Status  05/02/2023 92.8 80.0 - 100.0 fL Final   No results found for: PLTCOUNTKUC, LABPLAT, POCPLA RDW  Date Value Ref Range Status  05/02/2023 11.9 11.0 - 15.0 % Final

## 2024-04-26 ENCOUNTER — Ambulatory Visit: Payer: Medicare HMO

## 2024-04-26 DIAGNOSIS — Z780 Asymptomatic menopausal state: Secondary | ICD-10-CM | POA: Diagnosis not present

## 2024-04-26 DIAGNOSIS — Z0000 Encounter for general adult medical examination without abnormal findings: Secondary | ICD-10-CM

## 2024-04-26 NOTE — Progress Notes (Signed)
 Subjective:   Kiara Brady is a 78 y.o. who presents for a Medicare Wellness preventive visit.  As a reminder, Annual Wellness Visits don't include a physical exam, and some assessments may be limited, especially if this visit is performed virtually. We may recommend an in-person follow-up visit with your provider if needed.  Visit Complete: Virtual I connected with  Kiara Brady on 04/26/24 by a audio enabled telemedicine application and verified that I am speaking with the correct person using two identifiers.  Patient Location: Home  Provider Location: Home Office  I discussed the limitations of evaluation and management by telemedicine. The patient expressed understanding and agreed to proceed.  Vital Signs: Because this visit was a virtual/telehealth visit, some criteria may be missing or patient reported. Any vitals not documented were not able to be obtained and vitals that have been documented are patient reported.  VideoDeclined- This patient declined Librarian, academic. Therefore the visit was completed with audio only.  Persons Participating in Visit: Patient.  AWV Questionnaire: No: Patient Medicare AWV questionnaire was not completed prior to this visit.  Cardiac Risk Factors include: advanced age (>6men, >44 women);diabetes mellitus;dyslipidemia     Objective:    There were no vitals filed for this visit. There is no height or weight on file to calculate BMI.     04/26/2024    2:17 PM 04/21/2023    2:33 PM 04/20/2023    6:08 PM 04/15/2022    9:24 AM 04/14/2021    9:46 AM 03/13/2020   10:04 AM 08/24/2018   11:23 AM  Advanced Directives  Does Patient Have a Medical Advance Directive? No No No No No Yes No   Type of Careers adviser;Living will   Copy of Healthcare Power of Attorney in Chart?      No - copy requested   Would patient like information on creating a medical advance directive? No -  Patient declined  No - Patient declined No - Patient declined Yes (MAU/Ambulatory/Procedural Areas - Information given)       Data saved with a previous flowsheet row definition    Current Medications (verified) Outpatient Encounter Medications as of 04/26/2024  Medication Sig   atorvastatin  (LIPITOR) 10 MG tablet TAKE 1 TABLET BY MOUTH AT BEDTIME   cetirizine  (ZYRTEC ) 10 MG tablet Take 1 tablet (10 mg total) by mouth daily.   Cholecalciferol  (VITAMIN D3) 10 MCG (400 UNIT) tablet Take 400 Units by mouth daily.   famotidine  (PEPCID ) 20 MG tablet Take 1 tablet (20 mg total) by mouth 2 (two) times daily as needed (itching/rash).   metFORMIN  (GLUCOPHAGE -XR) 500 MG 24 hr tablet TAKE 1 TABLET BY MOUTH TWICE DAILY WITH A MEAL   tiZANidine  (ZANAFLEX ) 4 MG tablet Take 1 tablet (4 mg total) by mouth at bedtime.   triamcinolone  cream (KENALOG ) 0.1 % Apply 1 Application topically 2 (two) times daily.   predniSONE  (DELTASONE ) 10 MG tablet Take 30 mg (3 tabs) poqam x 3 d, take 20 mg (2 tabs) poqam x 3d 10 mg (1 tab) poqam x 4 d take with food (Patient not taking: Reported on 04/26/2024)   No facility-administered encounter medications on file as of 04/26/2024.    Allergies (verified) Oxycodone, Penicillin g, Protonix  [pantoprazole  sodium], Other, Penicillins, Prednisone , Vitamin d  analogs, Xanax [alprazolam], and Fosamax  [alendronate  sodium]   History: Past Medical History:  Diagnosis Date   Diabetes mellitus without complication (HCC)  Hyperlipidemia    Osteopenia of neck of femur 12/19/2017   Osteoporosis 02/06/2018   July 2019   Polio    [place]   Past Surgical History:  Procedure Laterality Date   VARICOSE VEIN SURGERY     Family History  Problem Relation Age of Onset   Heart disease Mother    Alzheimer's disease Mother    Heart attack Mother    Alcohol abuse Father    Cancer Maternal Grandmother        breast   Breast cancer Maternal Grandmother     Diabetes Sister    Social History   Socioeconomic History   Marital status: Divorced    Spouse name: Not on file   Number of children: 2   Years of education: Not on file   Highest education level: Master's degree (e.g., MA, MS, MEng, MEd, MSW, MBA)  Occupational History   Not on file  Tobacco Use   Smoking status: Former   Smokeless tobacco: Never  Vaping Use   Vaping status: Never Used  Substance and Sexual Activity   Alcohol use: No   Drug use: No   Sexual activity: Not Currently  Other Topics Concern   Not on file  Social History Narrative   Pt lives alone, daughter is in another country and son lives in [place].    Originally from the [place]   Social Drivers of Health   Financial Resource Strain: Low Risk  (04/26/2024)   Overall Financial Resource Strain (CARDIA)    Difficulty of Paying Living Expenses: Not hard at all  Food Insecurity: No Food Insecurity (04/26/2024)   Hunger Vital Sign    Worried About Running Out of Food in the Last Year: Never true    Ran Out of Food in the Last Year: Never true  Transportation Needs: No Transportation Needs (04/26/2024)   PRAPARE - Administrator, Civil Service (Medical): No    Lack of Transportation (Non-Medical): No  Physical Activity: Insufficiently Active (04/26/2024)   Exercise Vital Sign    Days of Exercise per Week: 3 days    Minutes of Exercise per Session: 30 min  Stress: No Stress Concern Present (04/26/2024)   [employer] of Occupational Health - Occupational Stress Questionnaire    Feeling of Stress: Not at all  Social Connections: Socially Isolated (04/26/2024)   Social Connection and Isolation Panel    Frequency of Communication with Friends and Family: Twice a week    Frequency of Social Gatherings with Friends and Family: Never    Attends Religious Services: Never    Database administrator or Organizations: No    Attends Engineer, structural: Never    Marital Status:  Divorced    Tobacco Counseling Counseling given: Not Answered    Clinical Intake:  Pre-visit preparation completed: Yes  Pain : No/denies pain     BMI - recorded: 22.3 Nutritional Status: BMI of 19-24  Normal Nutritional Risks: None Diabetes: Yes CBG done?: No Did pt. bring in CBG monitor from home?: No  Lab Results  Component Value Date   HGBA1C 6.3 (H) 05/02/2023   HGBA1C 6.7 (H) 09/14/2022   HGBA1C 7.2 (H) 05/18/2022     How often do you need to have someone help you when you read instructions, pamphlets, or other written materials from your doctor or pharmacy?: 1 - Never  Interpreter Needed?: No  Information entered by :: JHONNIE DAS, LPN  Activities of Daily Living     04/26/2024    2:19 PM 05/02/2023   10:46 AM  In your present state of health, do you have any difficulty performing the following activities:  Hearing? 0 0  Vision? 0 0  Difficulty concentrating or making decisions? 0 0  Walking or climbing stairs? 0 0  Dressing or bathing? 0 0  Doing errands, shopping? 0 0  Preparing Food and eating ? N   Using the Toilet? N   In the past six months, have you accidently leaked urine? Y   Do you have problems with loss of bowel control? N   Managing your Medications? N   Managing your Finances? N   Housekeeping or managing your Housekeeping? N     Patient Care Team: Tapia, Leisa, PA-C as PCP - General (Family Medicine) Jaye Fallow, MD as Referring Physician (Ophthalmology)  I have updated your Care Teams any recent Medical Services you may have received from other providers in the past year.     Assessment:   This is a routine wellness examination for Kiara Brady.  Hearing/Vision screen Hearing Screening - Comments:: NO AIDS Vision Screening - Comments:: READERS-   Goals Addressed             This Visit's Progress    DIET - EAT MORE FRUITS AND VEGETABLES         Depression Screen     04/26/2024    2:15 PM 02/23/2024    9:19 AM  08/02/2023    9:29 AM 05/02/2023   10:46 AM 04/21/2023    2:29 PM 04/11/2023    3:48 PM 02/18/2023   10:57 AM  PHQ 2/9 Scores  PHQ - 2 Score 0 0 0 0 0 0 0  PHQ- 9 Score 0 0  0  0 0    Fall Risk     04/26/2024    2:19 PM 02/23/2024    9:19 AM 08/02/2023    9:29 AM 05/02/2023   10:45 AM 04/21/2023    2:21 PM  Fall Risk   Falls in the past year? 0 1 0 1 1  Comment     pt says she keeps falling from dizziness or blood suger or from gasoline gas at station  Number falls in past yr: 0 1 0 1 1  Injury with Fall? 0 1 0 0 0  Risk for fall due to : History of fall(s) Impaired balance/gait;Impaired mobility No Fall Risks History of fall(s) History of fall(s);Impaired balance/gait  Follow up Falls evaluation completed;Falls prevention discussed  Falls evaluation completed Falls evaluation completed;Education provided;Falls prevention discussed Education provided;Falls prevention discussed  Comment     pt says she has boxes all in her home..thought she was moving to [place]     MEDICARE RISK AT HOME:  Medicare Risk at Home Any stairs in or around the home?: Yes If so, are there any without handrails?: No Home free of loose throw rugs in walkways, pet beds, electrical cords, etc?: Yes Adequate lighting in your home to reduce risk of falls?: Yes Life alert?: No Use of a cane, walker or w/c?: No Grab bars in the bathroom?: Yes Shower chair or bench in shower?: No Elevated toilet seat or a handicapped toilet?: No  TIMED UP AND GO:  Was the test performed?  No  Cognitive Function: 6CIT completed    09/26/2020    1:05 PM  MMSE - Mini Mental State Exam  Orientation to time 5  Orientation to Place  5  Registration 3  Attention/ Calculation 5  Recall 3  Language- name 2 objects 2  Language- repeat 1  Language- follow 3 step command 3  Language- read & follow direction 1  Write a sentence 1  Copy design 1  Total score 30        04/26/2024    2:22 PM 04/21/2023    2:34 PM 04/15/2022    9:25  AM 08/01/2018    2:18 PM 01/10/2017    3:31 PM  6CIT Screen  What Year?  0 points 0 points 0 points 0 points  What month? 0 points 0 points 0 points 0 points 0 points  What time? 0 points 0 points 0 points 0 points 0 points  Count back from 20 0 points 0 points 0 points 0 points 0 points  Months in reverse 0 points 0 points 0 points 0 points 0 points  Repeat phrase 0 points 0 points 0 points 0 points 0 points  Total Score  0 points 0 points 0 points 0 points    Immunizations Immunization History  Administered Date(s) Administered   Fluad Quad(high Dose 65+) 04/14/2021   Fluad Trivalent(High Dose 65+) 05/02/2023   Hep A / Hep B 10/26/2023   INFLUENZA, HIGH DOSE SEASONAL PF 04/21/2013, 06/09/2018, 04/17/2019, 04/15/2022, 08/04/2023   Influenza-Unspecified 05/06/2017, 06/09/2018, 03/02/2020   Moderna Sars-Covid-2 Vaccination 09/14/2019, 10/12/2019, 05/22/2020, 10/29/2020, 04/25/2022   Pneumococcal Conjugate Pcv21, Polysaccharide Crm197 Conjugaf 08/04/2023   Pneumococcal Conjugate-13 06/05/2016, 07/01/2017   Pneumococcal Polysaccharide-23 05/10/2019   Respiratory Syncytial Virus Vaccine,Recomb Aduvanted(Arexvy) 08/12/2022   Tdap 08/24/2018   Zoster Recombinant(Shingrix ) 09/25/2021, 11/23/2021, 08/04/2023, 10/26/2023    Screening Tests Health Maintenance  Topic Date Due   DEXA SCAN  04/28/2023   FOOT EXAM  09/15/2023   HEMOGLOBIN A1C  10/30/2023   Influenza Vaccine  03/02/2024   OPHTHALMOLOGY EXAM  03/11/2024   COVID-19 Vaccine (6 - 2025-26 season) 04/02/2024   Diabetic kidney evaluation - eGFR measurement  05/01/2024   Diabetic kidney evaluation - Urine ACR  05/01/2024   Mammogram  03/08/2025   Medicare Annual Wellness (AWV)  04/26/2025   DTaP/Tdap/Td (2 - Td or Tdap) 08/24/2028   Pneumococcal Vaccine: 50+ Years  Completed   Hepatitis C Screening  Completed   Zoster Vaccines- Shingrix   Completed   HPV VACCINES  Aged Out   Meningococcal B Vaccine  Aged Out   Hepatitis B  Vaccines 19-59 Average Risk  Discontinued   Colonoscopy  Discontinued    Health Maintenance Items Addressed: NEEDS FLU SHOTS; AGED OUT OF MAMMOGRAM & COLONOSCOPY;  BDS ORDERED  Additional Screening:  Vision Screening: Recommended annual ophthalmology exams for early detection of glaucoma and other disorders of the eye. Is the patient up to date with their annual eye exam?  Yes  Who is the provider or what is the name of the office in which the patient attends annual eye exams? [facility]  Dental Screening: Recommended annual dental exams for proper oral hygiene  Community Resource Referral / Chronic Care Management: CRR required this visit?  No   CCM required this visit?  No   Plan:    I have personally reviewed and noted the following in the patient's chart:   Medical and social history Use of alcohol, tobacco or illicit drugs  Current medications and supplements including opioid prescriptions. Patient is not currently taking opioid prescriptions. Functional ability and status Nutritional status Physical activity Advanced directives List of other physicians Hospitalizations, surgeries, and ER visits in  previous 12 months Vitals Screenings to include cognitive, depression, and falls Referrals and appointments  In addition, I have reviewed and discussed with patient certain preventive protocols, quality metrics, and best practice recommendations. A written personalized care plan for preventive services as well as general preventive health recommendations were provided to patient.   Jhonnie GORMAN Das, LPN   0/74/7974   After Visit Summary: (MyChart) Due to this being a telephonic visit, the after visit summary with patients personalized plan was offered to patient via MyChart   Notes: Nothing significant to report at this time. BDS ORDERED, BUT PT IS GONG TO CHECK W/ INSURANCE FIRST

## 2024-04-26 NOTE — Patient Instructions (Addendum)
 Kiara Brady,  Thank you for taking the time for your Medicare Wellness Visit. I appreciate your continued commitment to your health goals. Please review the care plan we discussed, and feel free to reach out if I can assist you further.  Medicare recommends these wellness visits once per year to help you and your care team stay ahead of potential health issues. These visits are designed to focus on prevention, allowing your provider to concentrate on managing your acute and chronic conditions during your regular appointments.  Please note that Annual Wellness Visits do not include a physical exam. Some assessments may be limited, especially if the visit was conducted virtually. If needed, we may recommend a separate in-person follow-up with your provider.  Ongoing Care Seeing your primary care provider every 3 to 6 months helps us  monitor your health and provide consistent, personalized care.   Referrals If a referral was made during today's visit and you haven't received any updates within two weeks, please contact the referred provider directly to check on the status. You have an order for:  []   2D Mammogram  []   3D Mammogram  [x]   Bone Density     Please call for appointment:  [facility]  [address] [place] [address] [phone] [facility] [address] [place], [place] [address] [phone] [facility] [address] [place], [place] [address] [phone]   Make sure to wear two-piece clothing.  No lotions, powders, or deodorants the day of the appointment. Make sure to bring picture ID and insurance card.  Bring list of medications you are currently taking including any supplements.   Schedule your [facility] screening mammogram through MyChart!   Log into your MyChart account.  Go to 'Visit' (or 'Appointments' if on mobile App) -->  Schedule an Appointment  Under 'Select a Reason for Visit' choose the Mammogram Screening option.  Complete the pre-visit questions and select the time and place that best fits your schedule.   Recommended Screenings:  Health Maintenance  Topic Date Due   DEXA scan (bone density measurement)  04/28/2023   Complete foot exam   09/15/2023   Hemoglobin A1C  10/30/2023   Flu Shot  03/02/2024   Eye exam for diabetics  03/11/2024   COVID-19 Vaccine (6 - 2025-26 season) 04/02/2024   Yearly kidney function blood test for diabetes  05/01/2024   Yearly kidney health urinalysis for diabetes  05/01/2024   Breast Cancer Screening  03/08/2025   Medicare Annual Wellness Visit  04/26/2025   DTaP/Tdap/Td vaccine (2 - Td or Tdap) 08/24/2028   Pneumococcal Vaccine for age over 39  Completed   Hepatitis C Screening  Completed   Zoster (Shingles) Vaccine  Completed   HPV Vaccine  Aged Out   Meningitis B Vaccine  Aged Out   Hepatitis B Vaccine  Discontinued   Colon Cancer Screening  Discontinued     Advance Care Planning is important because it: Ensures you receive medical care that aligns with your values, goals, and preferences. Provides guidance to your family and loved ones, reducing the emotional burden of decision-making during critical moments.  Vision: Annual vision screenings are recommended for early detection of glaucoma, cataracts, and diabetic retinopathy. These exams can also reveal signs of chronic conditions such as diabetes and high blood pressure.  Dental: Annual dental screenings help detect early signs of oral cancer, gum  disease, and other conditions linked to overall health, including heart disease and diabetes.  Please see the attached documents for additional preventive care recommendations.   NEXT AWV 05/02/25 @ 2:00 PM BY PHONE

## 2024-04-27 ENCOUNTER — Telehealth: Payer: Self-pay

## 2024-04-27 NOTE — Telephone Encounter (Signed)
 Called left pt vm CPT code for Dexa bone density scan is 77080 and the diagnosis code is Z78.

## 2024-04-27 NOTE — Telephone Encounter (Signed)
 Copied from CRM #[id number]. Topic: Clinical - Medical Advice >> Apr 27, 2024 10:28 AM Rea ORN wrote: Reason for CRM: Pt would like to know what the procedure code is for her Bone Density Scan. She needs to give this info to her insurance.   Please call back and if she doesn't answer, please leave a voicemail. >> Apr 27, 2024  2:10 PM Montie POUR wrote: She is having trouble with her telephone and needs the procedure code is for her Bone Density Scan to see if insurance will pay for it. She thinks her phone might be fixed but is not sure. She will call billing department to see if they can give her a procedure code. If this does not work she might call clinic back. Please try to call her at [phone]. Thanks >> Apr 27, 2024 12:05 PM Delon HERO wrote: Patient is calling stating that she missed a call about this request. Please advise

## 2024-04-27 NOTE — Telephone Encounter (Signed)
 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

## 2024-05-04 ENCOUNTER — Encounter: Admitting: Family Medicine

## 2024-05-17 NOTE — Progress Notes (Signed)
 Kiara Brady                                          MRN: [id number]   05/17/2024   The [facility] Quality Team Specialist reviewed this patient medical record for the purposes of chart review for care gap closure. The following were reviewed: chart review for care gap closure-kidney health evaluation for diabetes:eGFR  and uACR.    [facility]BC[facility] Quality Team

## 2024-06-05 ENCOUNTER — Encounter: Admitting: Family Medicine

## 2024-06-13 ENCOUNTER — Encounter: Admitting: Internal Medicine

## 2024-07-12 ENCOUNTER — Telehealth: Payer: Self-pay | Admitting: Family Medicine

## 2024-07-12 DIAGNOSIS — E1169 Type 2 diabetes mellitus with other specified complication: Secondary | ICD-10-CM

## 2024-07-12 DIAGNOSIS — E1165 Type 2 diabetes mellitus with hyperglycemia: Secondary | ICD-10-CM

## 2024-07-12 MED ORDER — METFORMIN HCL ER 500 MG PO TB24: 500.0000 mg | ORAL_TABLET | Freq: Two times a day (BID) | ORAL | 0 refills | Status: AC

## 2024-07-12 NOTE — Telephone Encounter (Signed)
metFORMIN (GLUCOPHAGE XR) 500 MG 24 hr tablet

## 2024-07-13 NOTE — Progress Notes (Signed)
 Kiara Brady                                          MRN: [id number]   07/13/2024   The [facility] Quality Team Specialist reviewed this patient medical record for the purposes of chart review for care gap closure. The following were reviewed: chart review for care gap closure-kidney health evaluation for diabetes:eGFR  and uACR.    [facility]BCI Quality Team

## 2024-07-18 ENCOUNTER — Other Ambulatory Visit: Payer: Self-pay | Admitting: Internal Medicine

## 2024-07-18 DIAGNOSIS — E119 Type 2 diabetes mellitus without complications: Secondary | ICD-10-CM

## 2024-07-18 DIAGNOSIS — E1169 Type 2 diabetes mellitus with other specified complication: Secondary | ICD-10-CM

## 2024-07-18 NOTE — Telephone Encounter (Unsigned)
 Copied from CRM #[id number]. Topic: Clinical - Medication Refill >> Jul 18, 2024  1:36 PM Joesph B wrote: Medication: atorvastatin  (LIPITOR) 10 MG tablet [[id number]]-   Has the patient contacted their pharmacy? Yes (Agent: If no, request that the patient contact the pharmacy for the refill. If patient does not wish to contact the pharmacy document the reason why and proceed with request.) (Agent: If yes, when and what did the pharmacy advise?)  This is the patient's preferred pharmacy:  [facility] [address] [place] - [address] [place] [address] Phone: [phone] Fax: [phone]  Is this the correct pharmacy for this prescription? Yes If no, delete pharmacy and type the correct one.   Has the prescription been filled recently? Yes  Is the patient out of the medication? Yes  Has the patient been seen for an appointment in the last year OR does the patient have an upcoming appointment? Yes  Can we respond through MyChart? Yes  Agent: Please be advised that Rx refills may take up to 3 business days. We ask that you follow-up with your pharmacy.

## 2024-07-20 ENCOUNTER — Other Ambulatory Visit: Payer: Self-pay | Admitting: Family Medicine

## 2024-07-20 DIAGNOSIS — E1169 Type 2 diabetes mellitus with other specified complication: Secondary | ICD-10-CM

## 2024-07-20 DIAGNOSIS — E119 Type 2 diabetes mellitus without complications: Secondary | ICD-10-CM

## 2024-07-20 NOTE — Telephone Encounter (Unsigned)
 Copied from CRM #[id number]. Topic: Clinical - Prescription Issue >> Jul 20, 2024  1:55 PM Rea ORN wrote: Reason for CRM: Pt calling to get status of Atorvastatin . Pt stated she needs to pick this rx and Metformin  together but Atorvastatin  has not been sent yet. Pt stated the pharmacy told her they will have to put Metformin  back in stock if she doesn't pick it up soon. Please call pt back to advise status of Atorvastatin , [phone]

## 2024-07-20 NOTE — Telephone Encounter (Signed)
 Second request

## 2024-07-20 NOTE — Telephone Encounter (Signed)
 Pt overdue for a follow up

## 2024-07-20 NOTE — Telephone Encounter (Signed)
 Lvm to scheduled appt-also informed that leisa is no longer in office and for her to schedule with one of the other providers on staff

## 2024-07-20 NOTE — Telephone Encounter (Signed)
 Requested medication (s) are due for refill today: Yes  Requested medication (s) are on the active medication list: Yes  Last refill:  04/04/24  Future visit scheduled: No  Notes to clinic:  Unable to refill per protocol, last refill by another provider, appointment needed.     Requested Prescriptions  Pending Prescriptions Disp Refills   atorvastatin  (LIPITOR) 10 MG tablet 90 tablet 0    Sig: Take 1 tablet (10 mg total) by mouth at bedtime.     Cardiovascular:  Antilipid - Statins Failed - 07/20/2024 10:55 PM      Failed - Lipid Panel in normal range within the last 12 months    Cholesterol, Total  Date Value Ref Range Status  09/08/2015 166 100 - 199 mg/dL Final   Cholesterol  Date Value Ref Range Status  05/02/2023 167 <200 mg/dL Final   LDL Cholesterol (Calc)  Date Value Ref Range Status  05/02/2023 70 mg/dL (calc) Final    Comment:    Reference range: <100 . Desirable range <100 mg/dL for primary prevention;   <70 mg/dL for patients with CHD or diabetic patients  with > or = 2 CHD risk factors. SABRA LDL-C is now calculated using the Martin-Hopkins  calculation, which is a validated novel method providing  better accuracy than the Friedewald equation in the  estimation of LDL-C.  Gladis APPLETHWAITE et al. SANDREA. 7986;689(80): 2061-2068  (http://education.[facility].com/faq/FAQ164)    HDL  Date Value Ref Range Status  05/02/2023 81 > OR = 50 mg/dL Final  97/93/7982 68 >60 mg/dL Final   Triglycerides  Date Value Ref Range Status  05/02/2023 75 <150 mg/dL Final         Passed - Patient is not pregnant      Passed - Valid encounter within last 12 months    Recent Outpatient Visits           4 months ago Breast pain   [facility] Gareth Mliss FALCON, FNP   4 months ago Pruritic dermatitis   [facility] Gareth Mliss FALCON, FNP   5 months ago Pruritic dermatitis   [facility] Leavy Mole, PA-C   8 months ago Syncope and collapse   [facility] Bernardo Fend, [place]

## 2024-07-23 MED ORDER — ATORVASTATIN CALCIUM 10 MG PO TABS: 10.0000 mg | ORAL_TABLET | Freq: Every day | ORAL | 0 refills | Status: AC

## 2024-08-03 ENCOUNTER — Ambulatory Visit: Admitting: Family Medicine

## 2024-08-03 ENCOUNTER — Encounter: Payer: Self-pay | Admitting: Family Medicine

## 2024-08-03 VITALS — BP 128/82 | HR 72 | Resp 16 | Ht 62.0 in | Wt 119.0 lb

## 2024-08-03 DIAGNOSIS — Z7984 Long term (current) use of oral hypoglycemic drugs: Secondary | ICD-10-CM

## 2024-08-03 DIAGNOSIS — E785 Hyperlipidemia, unspecified: Secondary | ICD-10-CM

## 2024-08-03 DIAGNOSIS — K219 Gastro-esophageal reflux disease without esophagitis: Secondary | ICD-10-CM

## 2024-08-03 DIAGNOSIS — R350 Frequency of micturition: Secondary | ICD-10-CM

## 2024-08-03 DIAGNOSIS — M810 Age-related osteoporosis without current pathological fracture: Secondary | ICD-10-CM

## 2024-08-03 DIAGNOSIS — E559 Vitamin D deficiency, unspecified: Secondary | ICD-10-CM

## 2024-08-03 DIAGNOSIS — E119 Type 2 diabetes mellitus without complications: Secondary | ICD-10-CM

## 2024-08-03 NOTE — Progress Notes (Signed)
 "  Established Patient Office Visit  Subjective   Patient ID: Kiara Brady, female    DOB: Jun 11, 1946  Age: 79 y.o. MRN: [id number]  Chief Complaint  Patient presents with   Transitions Of Care    HPI Patient is a pleasant 79 year old female who is here today for chronic condition follow up. She is a new patient to me. She voices frustration with having to void during the night. She describes this as increased urinary frequency. She denies increased urinary frequency or urgency during the day. She voices symptoms have been ongoing for one year. Denies dysuria or hematuria.   Review of Systems  Constitutional:  Negative for fever and malaise/fatigue.  Respiratory:  Negative for shortness of breath.   Cardiovascular:  Negative for chest pain.  Genitourinary:  Positive for frequency. Negative for dysuria and hematuria.  Neurological:  Negative for tingling.      Objective:     BP 128/82   Pulse 72   Resp 16   Ht 5' 2 (1.575 m)   Wt 119 lb (54 kg)   SpO2 99%   BMI 21.77 kg/m  BP Readings from Last 3 Encounters:  08/03/24 128/82  03/06/24 116/66  02/23/24 116/70   Wt Readings from Last 3 Encounters:  08/03/24 119 lb (54 kg)  03/06/24 122 lb 6.4 oz (55.5 kg)  02/23/24 119 lb 14.4 oz (54.4 kg)      Physical Exam Constitutional:      Appearance: Normal appearance.  HENT:     Head: Normocephalic and atraumatic.  Cardiovascular:     Rate and Rhythm: Normal rate and regular rhythm.     Pulses: Normal pulses.     Heart sounds: Normal heart sounds.  Pulmonary:     Effort: Pulmonary effort is normal. No respiratory distress.     Breath sounds: Normal breath sounds.  Musculoskeletal:     Right lower leg: No edema.     Left lower leg: No edema.     Right foot: Normal range of motion. No deformity.     Left foot: Normal range of motion. No deformity.  Feet:     Right foot:     Protective Sensation: 10 sites tested.  10 sites sensed.     Skin integrity: Skin  integrity normal.     Left foot:     Protective Sensation: 10 sites tested.  10 sites sensed.     Skin integrity: Skin integrity normal.  Skin:    General: Skin is warm and dry.  Neurological:     General: No focal deficit present.     Mental Status: She is alert and oriented to person, place, and time.  Psychiatric:        Mood and Affect: Mood normal.        Behavior: Behavior normal.    Last CBC Lab Results  Component Value Date   WBC 7.6 05/02/2023   HGB 12.6 05/02/2023   HCT 38.5 05/02/2023   MCV 92.8 05/02/2023   MCH 30.4 05/02/2023   RDW 11.9 05/02/2023   PLT 397 05/02/2023   Last metabolic panel Lab Results  Component Value Date   GLUCOSE 91 05/02/2023   NA 141 05/02/2023   K 3.9 05/02/2023   CL 102 05/02/2023   CO2 27 05/02/2023   BUN 10 05/02/2023   CREATININE 0.81 05/02/2023   EGFR 74 05/02/2023   CALCIUM  9.7 05/02/2023   PROT 7.3 05/02/2023   ALBUMIN 3.9 09/28/2016  LABGLOB 2.9 09/08/2015   AGRATIO 1.5 09/08/2015   BILITOT 0.9 05/02/2023   ALKPHOS 60 09/28/2016   AST 14 05/02/2023   ALT 8 05/02/2023   Last lipids Lab Results  Component Value Date   CHOL 167 05/02/2023   HDL 81 05/02/2023   LDLCALC 70 05/02/2023   TRIG 75 05/02/2023   CHOLHDL 2.1 05/02/2023   Last hemoglobin A1c Lab Results  Component Value Date   HGBA1C 6.3 (H) 05/02/2023   Last vitamin D  Lab Results  Component Value Date   VD25OH 18 (L) 09/07/2021          Assessment & Plan:   Assessment & Plan Type 2 diabetes mellitus without complication, without long-term current use of insulin (HCC) F/u on type 2 DM. Last A1c 6.3 04/2023. Type 2 DM, not insulin dependent, managed with Metformin  and lifestyle modifications.   -Update labs -Denies refill on Metformin . Advise to continue Metformin  as prescribed, no change -Reinforced importance of diabetic friendly diet Orders:   HgB A1c   Comprehensive Metabolic Panel (CMET)   Urine Microalbumin w/creat. ratio   HM  Diabetes Foot Exam   CBC with Differential/Platelet   Lipid Profile  Dyslipidemia Dyslipidemia managed with statin therapy, Atorvastatin . Last lipid panel showing well controlled dyslipidemia. Last LDL 75 04/2023.  -Continue Atorvastatin  10mg  once daily. Denies medication refill -Update labs Orders:   Comprehensive Metabolic Panel (CMET)   CBC with Differential/Platelet   Lipid Profile  Increased urinary frequency She voices complaints of increased urinary frequency, but describes frustration with having to get up during the night to void. She denies other associated symptoms.  -Recommended decreasing fluid intake closer to bedtime, approximately 1 to 2 hours prior to bedtime. Recommended small sips during the 1 to 2 hour timeframe before bed -Urine culture sent out for further evaluation to rule out UTI Orders:   CBC with Differential/Platelet   Urine Culture  Vitamin D  deficiency Vitamin D  deficiency previously found to be uncontrolled. Last vitamin D  level 18 in 09/2021. She reports she takes OTC vitamin D3 supplement.   -Advised to continue OTC vitamin D3 supplement daily -Update labs Orders:   Vitamin D  (25 hydroxy)  Gastroesophageal reflux disease, unspecified whether esophagitis present GERD well controlled. She reports minimal indigestion and acid reflux. GERD symptoms intermittent and typically resolved with OTC Tums. She voices she is trying to avoid trigger foods, especially closer to bedtime, and also stays upright for longer after eating. She reports she no longer takes Famotidine .  -Recommended she continue with dietary changes and other lifestyle changes for management of GERD symptoms -Ok to continue OTC Tums as needed for GERD symptoms -6 month f/u planned    Age-related osteoporosis without current pathological fracture DEXA not UTD. Last DEXA 04/2021 with findings consistent with osteoporosis.   -DEXA ordered -Advised she continue OTC vitamin D3 supplement  daily for management of vitamin D  deficiency Orders:   CBC with Differential/Platelet   DG Bone Density; Future   Vitamin D  (25 hydroxy)      Return in about 6 months (around 01/31/2025) for chronic condition follow up.    LAYMON LOISE CORE, FNP "

## 2024-08-03 NOTE — Assessment & Plan Note (Addendum)
 F/u on type 2 DM. Last A1c 6.3 04/2023. Type 2 DM, not insulin dependent, managed with Metformin  and lifestyle modifications.   -Update labs -Denies refill on Metformin . Advise to continue Metformin  as prescribed, no change -Reinforced importance of diabetic friendly diet Orders:   HgB A1c   Comprehensive Metabolic Panel (CMET)   Urine Microalbumin w/creat. ratio   HM Diabetes Foot Exam   CBC with Differential/Platelet   Lipid Profile

## 2024-08-03 NOTE — Assessment & Plan Note (Addendum)
 DEXA not UTD. Last DEXA 04/2021 with findings consistent with osteoporosis.   -DEXA ordered -Advised she continue OTC vitamin D3 supplement daily for management of vitamin D  deficiency Orders:   CBC with Differential/Platelet   DG Bone Density; Future   Vitamin D  (25 hydroxy)

## 2024-08-04 LAB — CBC WITH DIFFERENTIAL/PLATELET
Absolute Lymphocytes: 2171 {cells}/uL (ref 850–3900)
Absolute Monocytes: 486 {cells}/uL (ref 200–950)
Basophils Absolute: 32 {cells}/uL (ref 0–200)
Basophils Relative: 0.4 %
Eosinophils Absolute: 170 {cells}/uL (ref 15–500)
Eosinophils Relative: 2.1 %
HCT: 36.9 % (ref 35.9–46.0)
Hemoglobin: 11.8 g/dL (ref 11.7–15.5)
MCH: 29.9 pg (ref 27.0–33.0)
MCHC: 32 g/dL (ref 31.6–35.4)
MCV: 93.7 fL (ref 81.4–101.7)
MPV: 9.6 fL (ref 7.5–12.5)
Monocytes Relative: 6 %
Neutro Abs: 5241 {cells}/uL (ref 1500–7800)
Neutrophils Relative %: 64.7 %
Platelets: 375 Thousand/uL (ref 140–400)
RBC: 3.94 Million/uL (ref 3.80–5.10)
RDW: 11.8 % (ref 11.0–15.0)
Total Lymphocyte: 26.8 %
WBC: 8.1 Thousand/uL (ref 3.8–10.8)

## 2024-08-04 LAB — COMPREHENSIVE METABOLIC PANEL WITH GFR
AG Ratio: 1.6 (calc) (ref 1.0–2.5)
ALT: 11 U/L (ref 6–29)
AST: 18 U/L (ref 10–35)
Albumin: 4.4 g/dL (ref 3.6–5.1)
Alkaline phosphatase (APISO): 69 U/L (ref 37–153)
BUN: 7 mg/dL (ref 7–25)
CO2: 28 mmol/L (ref 20–32)
Calcium: 9.8 mg/dL (ref 8.6–10.4)
Chloride: 104 mmol/L (ref 98–110)
Creat: 0.83 mg/dL (ref 0.60–1.00)
Globulin: 2.7 g/dL (ref 1.9–3.7)
Glucose, Bld: 104 mg/dL — ABNORMAL HIGH (ref 65–99)
Potassium: 3.9 mmol/L (ref 3.5–5.3)
Sodium: 142 mmol/L (ref 135–146)
Total Bilirubin: 0.6 mg/dL (ref 0.2–1.2)
Total Protein: 7.1 g/dL (ref 6.1–8.1)
eGFR: 72 mL/min/1.73m2

## 2024-08-04 LAB — LIPID PANEL
Cholesterol: 159 mg/dL
HDL: 81 mg/dL
LDL Cholesterol (Calc): 59 mg/dL
Non-HDL Cholesterol (Calc): 78 mg/dL
Total CHOL/HDL Ratio: 2 (calc)
Triglycerides: 100 mg/dL

## 2024-08-04 LAB — URINE CULTURE
MICRO NUMBER:: 17419751
Result:: NO GROWTH
SPECIMEN QUALITY:: ADEQUATE

## 2024-08-04 LAB — VITAMIN D 25 HYDROXY (VIT D DEFICIENCY, FRACTURES): Vit D, 25-Hydroxy: 37 ng/mL (ref 30–100)

## 2024-08-04 LAB — HEMOGLOBIN A1C
Hgb A1c MFr Bld: 6.2 % — ABNORMAL HIGH
Mean Plasma Glucose: 131 mg/dL
eAG (mmol/L): 7.3 mmol/L

## 2024-08-04 LAB — MICROALBUMIN / CREATININE URINE RATIO
Creatinine, Urine: 16 mg/dL — ABNORMAL LOW (ref 20–275)
Microalb Creat Ratio: 13 mg/g{creat}
Microalb, Ur: 0.2 mg/dL

## 2024-08-06 ENCOUNTER — Ambulatory Visit: Payer: Self-pay | Admitting: Family Medicine

## 2024-08-21 ENCOUNTER — Ambulatory Visit: Admitting: Internal Medicine

## 2024-08-21 NOTE — Progress Notes (Signed)
 RICHANDA DARIN                                          MRN: [id number]   08/21/2024   The [facility]BCI Quality Team Specialist reviewed this patient medical record for the purposes of chart review for care gap closure. The following were reviewed: chart review for care gap closure-kidney health evaluation for diabetes:eGFR  and uACR.    VBCI Quality Team

## 2025-01-31 ENCOUNTER — Ambulatory Visit: Admitting: Family Medicine

## 2025-05-02 ENCOUNTER — Ambulatory Visit
